# Patient Record
Sex: Male | Born: 1960 | Race: White | Hispanic: No | Marital: Married | Smoking: Former smoker
Health system: Southern US, Community
[De-identification: ages and names within clinical notes are randomized; demographics above are authoritative.]

## PROBLEM LIST (undated history)

## (undated) DIAGNOSIS — M109 Gout, unspecified: Secondary | ICD-10-CM

## (undated) DIAGNOSIS — G2581 Restless legs syndrome: Secondary | ICD-10-CM

## (undated) DIAGNOSIS — Z8709 Personal history of other diseases of the respiratory system: Secondary | ICD-10-CM

## (undated) HISTORY — PX: APPENDECTOMY: SHX54

## (undated) HISTORY — DX: Gout, unspecified: M10.9

## (undated) HISTORY — DX: Restless legs syndrome: G25.81

---

## 2003-07-05 ENCOUNTER — Ambulatory Visit (HOSPITAL_COMMUNITY)
Admission: RE | Admit: 2003-07-05 | Discharge: 2003-07-05 | Payer: Self-pay | Admitting: Physical Medicine and Rehabilitation

## 2003-07-05 ENCOUNTER — Encounter: Payer: Self-pay | Admitting: Physical Medicine and Rehabilitation

## 2006-09-23 ENCOUNTER — Ambulatory Visit (HOSPITAL_COMMUNITY): Admission: RE | Admit: 2006-09-23 | Discharge: 2006-09-23 | Payer: Self-pay | Admitting: Internal Medicine

## 2013-12-05 ENCOUNTER — Inpatient Hospital Stay (HOSPITAL_COMMUNITY)
Admission: EM | Admit: 2013-12-05 | Discharge: 2013-12-08 | DRG: 013 | Disposition: A | Discharge status: Home / Self Care | Payer: Self-pay | Attending: Otolaryngology | Admitting: Otolaryngology

## 2013-12-05 ENCOUNTER — Emergency Department (HOSPITAL_COMMUNITY): Payer: Self-pay

## 2013-12-05 ENCOUNTER — Encounter (HOSPITAL_COMMUNITY)
Admission: EM | Disposition: A | Discharge status: Home / Self Care | Payer: Self-pay | Source: Home / Self Care | Attending: Otolaryngology

## 2013-12-05 ENCOUNTER — Encounter (HOSPITAL_COMMUNITY): Payer: Self-pay | Admitting: Anesthesiology

## 2013-12-05 ENCOUNTER — Encounter (HOSPITAL_COMMUNITY): Payer: Self-pay | Admitting: Emergency Medicine

## 2013-12-05 ENCOUNTER — Emergency Department (HOSPITAL_COMMUNITY): Payer: Self-pay | Admitting: Anesthesiology

## 2013-12-05 DIAGNOSIS — J36 Peritonsillar abscess: Principal | ICD-10-CM | POA: Diagnosis present

## 2013-12-05 DIAGNOSIS — R51 Headache: Secondary | ICD-10-CM | POA: Diagnosis present

## 2013-12-05 DIAGNOSIS — Z87891 Personal history of nicotine dependence: Secondary | ICD-10-CM

## 2013-12-05 HISTORY — PX: DIRECT LARYNGOSCOPY: SHX5326

## 2013-12-05 HISTORY — PX: TONSILLECTOMY: SHX5217

## 2013-12-05 HISTORY — PX: TRACHEOSTOMY TUBE PLACEMENT: SHX814

## 2013-12-05 LAB — CBC WITH DIFFERENTIAL/PLATELET
Basophils Absolute: 0 10*3/uL (ref 0.0–0.1)
Basophils Relative: 0 % (ref 0–1)
Eosinophils Relative: 1 % (ref 0–5)
HCT: 41.6 % (ref 39.0–52.0)
MCHC: 34.4 g/dL (ref 30.0–36.0)
MCV: 94.3 fL (ref 78.0–100.0)
Monocytes Absolute: 1.6 10*3/uL — ABNORMAL HIGH (ref 0.1–1.0)
Neutro Abs: 11.1 10*3/uL — ABNORMAL HIGH (ref 1.7–7.7)
Platelets: 215 10*3/uL (ref 150–400)
RDW: 13.9 % (ref 11.5–15.5)
WBC: 14.6 10*3/uL — ABNORMAL HIGH (ref 4.0–10.5)

## 2013-12-05 SURGERY — LARYNGOSCOPY, DIRECT
Anesthesia: General | Laterality: Right

## 2013-12-05 MED ORDER — IOHEXOL 300 MG/ML  SOLN
100.0000 mL | Freq: Once | INTRAMUSCULAR | Status: AC | PRN
  Administered 2013-12-05: 100 mL via INTRAVENOUS

## 2013-12-05 MED ORDER — SUCCINYLCHOLINE CHLORIDE 20 MG/ML IJ SOLN
INTRAMUSCULAR | Status: DC | PRN
  Administered 2013-12-05: 160 mg via INTRAVENOUS

## 2013-12-05 MED ORDER — SODIUM CHLORIDE 0.9 % IV BOLUS (SEPSIS)
500.0000 mL | Freq: Once | INTRAVENOUS | Status: AC
  Administered 2013-12-05: 500 mL via INTRAVENOUS

## 2013-12-05 MED ORDER — FENTANYL CITRATE 0.05 MG/ML IJ SOLN
INTRAMUSCULAR | Status: DC | PRN
  Administered 2013-12-05: 100 ug via INTRAVENOUS
  Administered 2013-12-05: 50 ug via INTRAVENOUS
  Administered 2013-12-05 (×2): 100 ug via INTRAVENOUS

## 2013-12-05 MED ORDER — MORPHINE SULFATE 4 MG/ML IJ SOLN
4.0000 mg | Freq: Once | INTRAMUSCULAR | Status: AC
  Administered 2013-12-05: 4 mg via INTRAVENOUS
  Filled 2013-12-05: qty 1

## 2013-12-05 MED ORDER — SODIUM CHLORIDE 0.9 % IV BOLUS (SEPSIS)
1000.0000 mL | Freq: Once | INTRAVENOUS | Status: AC
  Administered 2013-12-05: 1000 mL via INTRAVENOUS

## 2013-12-05 MED ORDER — MIDAZOLAM HCL 2 MG/2ML IJ SOLN
INTRAMUSCULAR | Status: AC
  Filled 2013-12-05: qty 2

## 2013-12-05 MED ORDER — PROPOFOL 10 MG/ML IV BOLUS
INTRAVENOUS | Status: AC
  Filled 2013-12-05: qty 20

## 2013-12-05 MED ORDER — SUCCINYLCHOLINE CHLORIDE 20 MG/ML IJ SOLN
INTRAMUSCULAR | Status: AC
Start: 2013-12-05 — End: 2013-12-05
  Filled 2013-12-05: qty 1

## 2013-12-05 MED ORDER — LIDOCAINE HCL 2 % EX GEL
Freq: Once | CUTANEOUS | Status: AC
  Administered 2013-12-05: 1

## 2013-12-05 MED ORDER — FENTANYL CITRATE 0.05 MG/ML IJ SOLN
INTRAMUSCULAR | Status: AC
  Filled 2013-12-05: qty 2

## 2013-12-05 MED ORDER — MIDAZOLAM HCL 5 MG/5ML IJ SOLN
INTRAMUSCULAR | Status: DC | PRN
  Administered 2013-12-05 – 2013-12-06 (×2): 2 mg via INTRAVENOUS

## 2013-12-05 MED ORDER — SUCCINYLCHOLINE CHLORIDE 20 MG/ML IJ SOLN
INTRAMUSCULAR | Status: AC
  Filled 2013-12-05: qty 1

## 2013-12-05 MED ORDER — LIDOCAINE HCL 2 % EX GEL
CUTANEOUS | Status: AC
  Administered 2013-12-05: 1
  Filled 2013-12-05: qty 10

## 2013-12-05 MED ORDER — LACTATED RINGERS IV SOLN
INTRAVENOUS | Status: DC | PRN
  Administered 2013-12-05: 22:00:00 via INTRAVENOUS

## 2013-12-05 MED ORDER — FENTANYL CITRATE 0.05 MG/ML IJ SOLN
INTRAMUSCULAR | Status: AC
  Filled 2013-12-05: qty 5

## 2013-12-05 MED ORDER — PROPOFOL 10 MG/ML IV BOLUS
INTRAVENOUS | Status: DC | PRN
  Administered 2013-12-05: 200 mg via INTRAVENOUS

## 2013-12-05 MED ORDER — LIDOCAINE HCL (PF) 2 % IJ SOLN
INTRAMUSCULAR | Status: DC | PRN
  Administered 2013-12-05: 75 mg via INTRADERMAL

## 2013-12-05 MED ORDER — LIDOCAINE HCL (CARDIAC) 20 MG/ML IV SOLN
INTRAVENOUS | Status: AC
  Filled 2013-12-05: qty 5

## 2013-12-05 MED ORDER — ONDANSETRON HCL 4 MG/2ML IJ SOLN
INTRAMUSCULAR | Status: AC
  Filled 2013-12-05: qty 2

## 2013-12-05 MED ORDER — BUPIVACAINE-EPINEPHRINE 0.5% -1:200000 IJ SOLN
INTRAMUSCULAR | Status: AC
  Filled 2013-12-05: qty 1

## 2013-12-05 MED ORDER — SODIUM CHLORIDE 0.9 % IV SOLN
INTRAVENOUS | Status: DC
  Administered 2013-12-05 – 2013-12-07 (×2): via INTRAVENOUS

## 2013-12-05 MED ORDER — PENICILLIN G POTASSIUM 5000000 UNITS IJ SOLR
2.4000 10*6.[IU] | Freq: Once | INTRAVENOUS | Status: AC
  Administered 2013-12-05: 2.4 10*6.[IU] via INTRAVENOUS
  Filled 2013-12-05: qty 2.4

## 2013-12-05 SURGICAL SUPPLY — 47 items
ATTRACTOMAT 16X20 MAGNETIC DRP (DRAPES) IMPLANT
BAG ZIPLOCK 12X15 (MISCELLANEOUS) ×3 IMPLANT
BALLN PULM 15 16.5 18X75 (BALLOONS) ×3
BALLOON PULM 15 16.5 18X75 (BALLOONS) ×2 IMPLANT
BLADE SURG 15 STRL LF DISP TIS (BLADE) ×2 IMPLANT
BLADE SURG 15 STRL SS (BLADE) ×1
BLADE SURG SZ10 CARB STEEL (BLADE) ×3 IMPLANT
CANISTER SUCTION 2500CC (MISCELLANEOUS) ×3 IMPLANT
CATH ROBINSON RED A/P 14FR (CATHETERS) IMPLANT
COAGULATOR SUCT SWTCH 10FR 6 (ELECTROSURGICAL) ×6 IMPLANT
CONT SPEC 4OZ CLIKSEAL STRL BL (MISCELLANEOUS) ×6 IMPLANT
COVER SURGICAL LIGHT HANDLE (MISCELLANEOUS) ×3 IMPLANT
CRADLE DONUT ADULT HEAD (MISCELLANEOUS) IMPLANT
DRAIN PENROSE 18X1/4 LTX STRL (WOUND CARE) IMPLANT
DRAPE PED LAPAROTOMY (DRAPES) ×3 IMPLANT
DRAPE PROXIMA HALF (DRAPES) ×3 IMPLANT
ELECT REM PT RETURN 9FT ADLT (ELECTROSURGICAL) ×3
ELECTRODE REM PT RTRN 9FT ADLT (ELECTROSURGICAL) ×2 IMPLANT
GAUZE PACKING IODOFORM 1/4X5 (PACKING) IMPLANT
GAUZE SPONGE 4X4 16PLY XRAY LF (GAUZE/BANDAGES/DRESSINGS) ×6 IMPLANT
GLOVE ECLIPSE 8.0 STRL XLNG CF (GLOVE) ×3 IMPLANT
GLOVE SURG SS PI 8.5 STRL IVOR (GLOVE) ×3
GLOVE SURG SS PI 8.5 STRL STRW (GLOVE) ×6 IMPLANT
GOWN SPEC L3 XXLG W/TWL (GOWN DISPOSABLE) ×9 IMPLANT
GOWN STRL REIN XL XLG (GOWN DISPOSABLE) ×3 IMPLANT
GUARD TEETH (MISCELLANEOUS) ×3 IMPLANT
KIT BASIN OR (CUSTOM PROCEDURE TRAY) ×6 IMPLANT
MARKER SKIN DUAL TIP RULER LAB (MISCELLANEOUS) ×3 IMPLANT
NS IRRIG 1000ML POUR BTL (IV SOLUTION) ×3 IMPLANT
PACK BASIC VI WITH GOWN DISP (CUSTOM PROCEDURE TRAY) ×3 IMPLANT
PACK EENT SPLIT (PACKS) ×3 IMPLANT
PAD ARMBOARD 7.5X6 YLW CONV (MISCELLANEOUS) ×6 IMPLANT
PATTIES SURGICAL .5 X3 (DISPOSABLE) IMPLANT
PENCIL BUTTON HOLSTER BLD 10FT (ELECTRODE) ×3 IMPLANT
SPONGE DRAIN TRACH 4X4 STRL 2S (GAUZE/BANDAGES/DRESSINGS) ×3 IMPLANT
SPONGE GAUZE 4X4 12PLY (GAUZE/BANDAGES/DRESSINGS) ×3 IMPLANT
SURGILUBE 2OZ TUBE FLIPTOP (MISCELLANEOUS) ×3 IMPLANT
SYR BULB IRRIGATION 50ML (SYRINGE) ×3 IMPLANT
SYR INFLATE BILIARY GAUGE (MISCELLANEOUS) ×3 IMPLANT
TOWEL OR 17X24 6PK STRL BLUE (TOWEL DISPOSABLE) ×6 IMPLANT
TRAP SPECIMEN MUCOUS 40CC (MISCELLANEOUS) IMPLANT
TUBE CONNECTING 12X1/4 (SUCTIONS) ×3 IMPLANT
TUBE TRACH SHILEY  6 DIST  CUF (TUBING) ×3 IMPLANT
WATER STERILE IRR 1000ML POUR (IV SOLUTION) ×3 IMPLANT
WATER STERILE IRR 1500ML POUR (IV SOLUTION) IMPLANT
YANKAUER SUCT BULB TIP 10FT TU (MISCELLANEOUS) ×3 IMPLANT
YANKAUER SUCT BULB TIP NO VENT (SUCTIONS) ×3 IMPLANT

## 2013-12-05 NOTE — Anesthesia Preprocedure Evaluation (Addendum)
Anesthesia Evaluation  Patient identified by MRN, date of birth, ID band Patient awake    Reviewed: Allergy & Precautions, H&P , NPO status , Patient's Chart, lab work & pertinent test results  Airway Mallampati: IV TM Distance: >3 FB Neck ROM: full  Mouth opening: Limited Mouth Opening  Dental no notable dental hx.    Pulmonary former smoker,    Pulmonary exam normal       Cardiovascular Exercise Tolerance: Good     Neuro/Psych negative neurological ROS  negative psych ROS   GI/Hepatic negative GI ROS, Neg liver ROS,   Endo/Other  negative endocrine ROS  Renal/GU negative Renal ROS  negative genitourinary   Musculoskeletal   Abdominal Normal abdominal exam  (+)   Peds  Hematology negative hematology ROS (+)   Anesthesia Other Findings   Reproductive/Obstetrics negative OB ROS                          Anesthesia Physical Anesthesia Plan  ASA: II and emergent  Anesthesia Plan: General   Post-op Pain Management:    Induction: Intravenous  Airway Management Planned: Oral ETT  Additional Equipment:   Intra-op Plan:   Post-operative Plan: Possible Post-op intubation/ventilation and Extubation in OR  Informed Consent: I have reviewed the patients History and Physical, chart, labs and discussed the procedure including the risks, benefits and alternatives for the proposed anesthesia with the patient or authorized representative who has indicated his/her understanding and acceptance.   Dental Advisory Given  Plan Discussed with: CRNA, Surgeon and Anesthesiologist  Anesthesia Plan Comments: (Glidescope in room.)       Anesthesia Quick Evaluation

## 2013-12-05 NOTE — ED Notes (Signed)
Pt presents with c/o throat swelling. Pt started with a scratchy and sore throat on Tuesday and it progressed to throat swelling on Wednesday. MD called in an antibiotic for him, azithromycin, yesterday and he has had 2 doses so far. Pt's wife reports that the swelling has not gone down any and pt says he has been coughing and feels like something is caught in his throat.

## 2013-12-05 NOTE — ED Provider Notes (Signed)
CSN: [id number]     Arrival date & time 12/05/13  1830 History   First MD Initiated Contact with Patient 12/05/13 1859     Chief Complaint  Patient presents with  . Oral Swelling   (Consider location/radiation/quality/duration/timing/severity/associated sxs/prior Treatment) The history is provided by the patient and the spouse.   patient here complaining of right-sided throat swelling x5 days. Saw his physician and was prescribed azithromycin in symptoms have not improved. Notes increased trouble swallowing but can handle her secretions. No fever or chills. Saw his Dr. today and sent here for evaluation of possible peritonsillar abscess.  History reviewed. No pertinent past medical history. Past Surgical History  Procedure Laterality Date  . Appendectomy     No family history on file. History  Substance Use Topics  . Smoking status: Former Games developer  . Smokeless tobacco: Not on file  . Alcohol Use: Yes     Comment: every other day     Review of Systems  All other systems reviewed and are negative.    Allergies  Review of patient's allergies indicates no known allergies.  Home Medications  No current outpatient prescriptions on file. BP 149/86  Pulse 91  Temp(Src) 98.7 F (37.1 C) (Oral)  Resp 18  SpO2 96% Physical Exam  Nursing note and vitals reviewed. Constitutional: He is oriented to person, place, and time. He appears well-developed and well-nourished.  Non-toxic appearance. No distress.  HENT:  Head: Normocephalic and atraumatic.  Mouth/Throat:    Eyes: Conjunctivae, EOM and lids are normal. Pupils are equal, round, and reactive to light.  Neck: Normal range of motion. Neck supple. No tracheal deviation present. No mass present.  Cardiovascular: Normal rate, regular rhythm and normal heart sounds.  Exam reveals no gallop.   No murmur heard. Pulmonary/Chest: Effort normal and breath sounds normal. No stridor. No respiratory distress. He has no decreased breath  sounds. He has no wheezes. He has no rhonchi. He has no rales.  Abdominal: Soft. Normal appearance and bowel sounds are normal. He exhibits no distension. There is no tenderness. There is no rebound and no CVA tenderness.  Musculoskeletal: Normal range of motion. He exhibits no edema and no tenderness.  Neurological: He is alert and oriented to person, place, and time. He has normal strength. No cranial nerve deficit or sensory deficit. GCS eye subscore is 4. GCS verbal subscore is 5. GCS motor subscore is 6.  Skin: Skin is warm and dry. No abrasion and no rash noted.  Psychiatric: He has a normal mood and affect. His speech is normal and behavior is normal.    ED Course  Procedures (including critical care time) Labs Review Labs Reviewed  CBC WITH DIFFERENTIAL   Imaging Review No results found.  EKG Interpretation   None       MDM  No diagnosis found.   Patient given morphine for his pain. CT of neck shows peritonsillar abscess. Spoke with ENT on call and he will come see the patient  Toy Baker, MD 12/05/13 2114

## 2013-12-05 NOTE — H&P (Signed)
Kort, Stettler 52 y.o., male [id number]     Chief Complaint: severe sore throat  HPI: 52 yo wm, onset sore throat 4 days ago.  Progressively worse, with localization to RIGHT side.  Hoarse, but no breathing difficulty.  Neck pain RIGHT, referred RIGHT otalgia.  Pain and difficulty with swallowing.  Had Zithromax started yesterday.  No documented fevers.   No DM, Prednisone, HIV or other immune issues.  Ex smoker.  No hx cancer.  No prior history tonsillitis, strep throat or similar throat neck infection.  No foreign body ingestion or trauma to throat.  No apparent problem teeth.  CT neck shows very inferior tonsil multiloculated abscess on RIGHT effacing vallecula and apparently into RIGHT base of tongue.  No obvious parapharyngeal involvement.    WUJ:WJXBJYN reviewed. No pertinent past medical history.  Surg Hx: Past Surgical History  Procedure Laterality Date  . Appendectomy      FHx:  No family history on file. SocHx:  reports that he has quit smoking. He does not have any smokeless tobacco history on file. He reports that he drinks alcohol. He reports that he does not use illicit drugs.  ALLERGIES: No Known Allergies   (Not in a hospital admission)  Results for orders placed during the hospital encounter of 12/05/13 (from the past 48 hour(s))  CBC WITH DIFFERENTIAL     Status: Abnormal   Collection Time    12/05/13  7:26 PM      Result Value Range   WBC 14.6 (*) 4.0 - 10.5 K/uL   RBC 4.41  4.22 - 5.81 MIL/uL   Hemoglobin 14.3  13.0 - 17.0 g/dL   HCT 82.9  56.2 - 13.0 %   MCV 94.3  78.0 - 100.0 fL   MCH 32.4  26.0 - 34.0 pg   MCHC 34.4  30.0 - 36.0 g/dL   RDW 86.5  78.4 - 69.6 %   Platelets 215  150 - 400 K/uL   Neutrophils Relative % 76  43 - 77 %   Neutro Abs 11.1 (*) 1.7 - 7.7 K/uL   Lymphocytes Relative 12  12 - 46 %   Lymphs Abs 1.7  0.7 - 4.0 K/uL   Monocytes Relative 11  3 - 12 %   Monocytes Absolute 1.6 (*) 0.1 - 1.0 K/uL   Eosinophils Relative 1  0 - 5 %    Eosinophils Absolute 0.1  0.0 - 0.7 K/uL   Basophils Relative 0  0 - 1 %   Basophils Absolute 0.0  0.0 - 0.1 K/uL   Ct Soft Tissue Neck W Contrast  12/05/2013   CLINICAL DATA:  Sore throat, right-sided pain, dysphagia.  EXAM: CT NECK WITH CONTRAST  TECHNIQUE: Multidetector CT imaging of the neck was performed using the standard protocol following the bolus administration of intravenous contrast.  CONTRAST:  OMNIPAQUE IOHEXOL 300 MG/ML  SOLN  COMPARISON:  None.  FINDINGS: Poorly marginated, peripherally enhancing multilocular right peritonsillar abscess extending inferiorly, effacing the right vallecula and extending into the right sublingual space. The largest component measures approximately 2.2 cm maximum transverse diameter. There is normal vascular enhancement. No significant airway compromise. No significant cervical adenopathy. No retropharyngeal fluid collections or gas. Visualized lung apices clear.  IMPRESSION: 1. Large right multiloculated peritonsillar abscess dissecting inferiorly into the sublingual space.   Electronically Signed   By: Oley Balm M.D.   On: 12/05/2013 20:55    ROS:  No chest pain or SOB.   Blood pressure  142/81, pulse 87, temperature 98.5 F (36.9 C), temperature source Oral, resp. rate 16, SpO2 95.00%.  PHYSICAL EXAM: Overall appearance:  Warm to touch, distressed.  No fetor oris.  Minimal "hot potato" voice.  Mental status intact. Head:  NCAT Ears:  Clear, nl TM's Nose:  Sl dry Oral Cavity:  Moist.  Teeth in fair to good repair Oral Pharynx/Hypopharynx/Larynx:  RIGHT tonsil erythematous without bulging or exudate.  Nl soft palate Neuro:  Grossly intact. Neck:  Tender but not particularly swollen RIGHT JDG region.  Min induration. Lungs:  Clear to auscultation Heart:  RRR, no murmurs Abd:  Soft, active Ext: nl config Neuro: symmetric, grossly intact.   Flexible laryngoscopy:  Using 2% viscous xylocaine topical anesthesia, flexible  laryngoscopy was performed through the LEFT nose.  Swelling of vallecula on RIGHT, swelling of epiglottis with retrodisplacement.  Glottis appears normal.    Studies Reviewed:  CT neck as above    Assessment/Plan Probable RIGHT peritonsillar abscess, although very inferior location, and involvement of base of tongue area is unusual.    Will examine under anesthesia including direct laryngoscopy.  RIGHT tonsillectomy to evacuate inferior RIGHT peritonsillar abscess.  Possible external approach.  Will consider leaving intubated overnight.  Possible tracheostomy.  Discussed with patient and wife.  Questions were answered.  Informed consent obtained.  Received 2.4 million units IV Pen in ED.    Flo Shanks 12/05/2013, 10:01 PM

## 2013-12-05 NOTE — ED Notes (Signed)
He tells me he has had a right-sided sore throat since this Tues., which persists, in spite of him taking Azithromycin.  He is in no distress; and states it is difficult to swallow, but he can manage it.

## 2013-12-06 DIAGNOSIS — J36 Peritonsillar abscess: Principal | ICD-10-CM | POA: Diagnosis present

## 2013-12-06 LAB — CREATININE, SERUM: Creatinine, Ser: 1.09 mg/dL (ref 0.50–1.35)

## 2013-12-06 MED ORDER — HEPARIN SODIUM (PORCINE) 5000 UNIT/ML IJ SOLN
5000.0000 [IU] | Freq: Three times a day (TID) | INTRAMUSCULAR | Status: DC
  Administered 2013-12-06 – 2013-12-08 (×7): 5000 [IU] via SUBCUTANEOUS
  Filled 2013-12-06 (×10): qty 1

## 2013-12-06 MED ORDER — MORPHINE SULFATE 10 MG/ML IJ SOLN
1.0000 mg | INTRAMUSCULAR | Status: DC | PRN
  Administered 2013-12-06: 1 mg via INTRAVENOUS
  Filled 2013-12-06: qty 1

## 2013-12-06 MED ORDER — MORPHINE SULFATE 2 MG/ML IJ SOLN
2.0000 mg | INTRAMUSCULAR | Status: DC | PRN
  Administered 2013-12-06: 2 mg via INTRAVENOUS
  Administered 2013-12-06: 4 mg via INTRAVENOUS
  Administered 2013-12-06: 2 mg via INTRAVENOUS
  Administered 2013-12-06: 4 mg via INTRAVENOUS
  Administered 2013-12-06 – 2013-12-07 (×4): 2 mg via INTRAVENOUS
  Filled 2013-12-06 (×2): qty 1
  Filled 2013-12-06 (×2): qty 2
  Filled 2013-12-06: qty 1
  Filled 2013-12-06: qty 2
  Filled 2013-12-06 (×2): qty 1

## 2013-12-06 MED ORDER — CLONAZEPAM 1 MG PO TABS
1.0000 mg | ORAL_TABLET | Freq: Every day | ORAL | Status: DC
  Administered 2013-12-07: 2 mg via ORAL
  Filled 2013-12-06: qty 2

## 2013-12-06 MED ORDER — PNEUMOCOCCAL VAC POLYVALENT 25 MCG/0.5ML IJ INJ
0.5000 mL | INJECTION | INTRAMUSCULAR | Status: AC
  Administered 2013-12-07: 0.5 mL via INTRAMUSCULAR
  Filled 2013-12-06 (×2): qty 0.5

## 2013-12-06 MED ORDER — SODIUM CHLORIDE 0.9 % IV SOLN
3.0000 g | Freq: Four times a day (QID) | INTRAVENOUS | Status: DC
  Administered 2013-12-06 – 2013-12-08 (×10): 3 g via INTRAVENOUS
  Filled 2013-12-06 (×17): qty 3

## 2013-12-06 MED ORDER — SODIUM CHLORIDE 0.9 % IV SOLN: 1.0000 g | Freq: Three times a day (TID) | INTRAVENOUS | Status: DC

## 2013-12-06 MED ORDER — LIDOCAINE VISCOUS 2 % MT SOLN
15.0000 mL | Freq: Once | OROMUCOSAL | Status: AC
  Administered 2013-12-07: 15 mL via OROMUCOSAL
  Filled 2013-12-06: qty 15

## 2013-12-06 MED ORDER — PROMETHAZINE HCL 25 MG/ML IJ SOLN: 6.2500 mg | INTRAMUSCULAR | Status: DC | PRN

## 2013-12-06 MED ORDER — KETOROLAC TROMETHAMINE 30 MG/ML IJ SOLN: 15.0000 mg | Freq: Once | INTRAMUSCULAR | Status: DC | PRN

## 2013-12-06 MED ORDER — HYDROMORPHONE HCL PF 1 MG/ML IJ SOLN
INTRAMUSCULAR | Status: AC
  Administered 2013-12-06: 0.5 mg
  Filled 2013-12-06: qty 1

## 2013-12-06 MED ORDER — ONDANSETRON HCL 4 MG/2ML IJ SOLN
4.0000 mg | Freq: Four times a day (QID) | INTRAMUSCULAR | Status: DC | PRN
  Administered 2013-12-06: 4 mg via INTRAVENOUS
  Filled 2013-12-06: qty 2

## 2013-12-06 MED ORDER — OXYCODONE-ACETAMINOPHEN 5-325 MG/5ML PO SOLN
5.0000 mL | ORAL | Status: DC | PRN
  Administered 2013-12-07 (×2): 5 mL via ORAL
  Filled 2013-12-06 (×2): qty 5

## 2013-12-06 MED ORDER — MELATONIN 10 MG PO CAPS: 1.0000 | ORAL_CAPSULE | Freq: Every day | ORAL | Status: DC

## 2013-12-06 MED ORDER — ONDANSETRON HCL 4 MG/2ML IJ SOLN
INTRAMUSCULAR | Status: DC | PRN
  Administered 2013-12-06: 4 mg via INTRAVENOUS

## 2013-12-06 MED ORDER — BUPIVACAINE-EPINEPHRINE PF 0.5-1:200000 % IJ SOLN
INTRAMUSCULAR | Status: DC | PRN
  Administered 2013-12-06: 50 mL

## 2013-12-06 MED ORDER — MORPHINE SULFATE 2 MG/ML IJ SOLN: 1.0000 mg | INTRAMUSCULAR | Status: DC | PRN

## 2013-12-06 MED ORDER — CITALOPRAM HYDROBROMIDE 40 MG PO TABS
40.0000 mg | ORAL_TABLET | Freq: Every day | ORAL | Status: DC
  Administered 2013-12-07: 40 mg via ORAL
  Filled 2013-12-06 (×4): qty 1

## 2013-12-06 MED ORDER — LORAZEPAM 2 MG/ML IJ SOLN
1.0000 mg | INTRAMUSCULAR | Status: DC | PRN
  Administered 2013-12-06 – 2013-12-07 (×5): 1 mg via INTRAVENOUS
  Filled 2013-12-06 (×5): qty 1

## 2013-12-06 MED ORDER — DEXTROSE-NACL 5-0.45 % IV SOLN
INTRAVENOUS | Status: DC
  Administered 2013-12-06 (×2): via INTRAVENOUS
  Administered 2013-12-06: 125 mL/h via INTRAVENOUS
  Administered 2013-12-07: 02:00:00 via INTRAVENOUS

## 2013-12-06 MED ORDER — 0.9 % SODIUM CHLORIDE (POUR BTL) OPTIME
TOPICAL | Status: DC | PRN
  Administered 2013-12-06: 1000 mL

## 2013-12-06 MED ORDER — MIDAZOLAM HCL 2 MG/2ML IJ SOLN
INTRAMUSCULAR | Status: AC
  Filled 2013-12-06: qty 2

## 2013-12-06 MED ORDER — PROMETHAZINE HCL 25 MG PO TABS: 25.0000 mg | ORAL_TABLET | Freq: Four times a day (QID) | ORAL | Status: DC | PRN

## 2013-12-06 MED ORDER — ALLOPURINOL 300 MG PO TABS
300.0000 mg | ORAL_TABLET | Freq: Every day | ORAL | Status: DC
  Administered 2013-12-08: 300 mg via ORAL
  Filled 2013-12-06 (×3): qty 1

## 2013-12-06 MED ORDER — MEPERIDINE HCL 50 MG/ML IJ SOLN: 6.2500 mg | INTRAMUSCULAR | Status: DC | PRN

## 2013-12-06 MED ORDER — PROMETHAZINE HCL 25 MG RE SUPP: 25.0000 mg | Freq: Four times a day (QID) | RECTAL | Status: DC | PRN

## 2013-12-06 MED ORDER — HYDROMORPHONE HCL PF 1 MG/ML IJ SOLN: 0.2500 mg | INTRAMUSCULAR | Status: DC | PRN

## 2013-12-06 NOTE — Significant Event (Signed)
Pt takes klonopin at home for anxiety and sleep.  Will order prn IV ativan until he is able to take po medications.  He also reports that 2 mg morphine every hour is insufficient for pain control at present.  Will change morphine to 2 -4 mg q1h prn IV.  Coralyn Helling, MD 12/06/2013, 1:52 AM

## 2013-12-06 NOTE — Progress Notes (Signed)
Second Attempted to contact MD oncall for ENT for patient's nausea. No response. Left message.

## 2013-12-06 NOTE — Op Note (Signed)
12/06/2013  12:23 AM    Morrison Old  [id number]   Pre-Op Dx:  Right peritonsillar abscess with extension into the base of tongue  Post-op Dx: Same  Proc: Direct laryngoscopy. Right tonsillectomy with drainage right peritonsillar abscess. Tracheostomy   Surg:  Flo Shanks T MD  Anes:  GOT  EBL:  50 mL  Comp:  None  Findings:  A bulky tongue.  Swelling at the base of tongue and supraglottis with some difficulty intubating the patient.  1-2+ inflamed right tonsil. No distinct abscess cavity identified peritonsillar or with hemostatic probing in the parapharyngeal area. With finger dissection, apparent soft cavities in the right vallecula/base of tongue consistent with abscess, but no frank pus visualized.  Lower midline neck anatomy.  Procedure: With the patient in a comfortable supine position, general orotracheal anesthesia was achieved after several attempts using a stylette, and finally the glide scope. Anesthesia was continued per orotracheal tube.  The table was turned 90 the patient placed in Trendelenburg. A clean preparation and draping was accomplished. A rubber tooth guard was applied. The anterior commissure laryngoscope was used to inspect the oropharynx hypopharynx and larynx with the findings as described above. No frank abscess was identified. The laryngoscope was removed. The tooth guard was removed. Dental status was intact.   With some difficulty owing to the bulky tongue and relatively small mouth, the Crowe-Davis mouth gag was introduced. A #3 grooved blade was not deep enough.  A #4 grooved blade was used.  Under direct vision, the right tonsil was dissected from its muscular fossa using electrocautery. The tonsil was removed in its entirety. No obvious pus was identified. Palpation in  the tonsil fossa did not reveal fluctuance or induration.  A Kelly hemostat was used to probe through the constrictor musculature and inferiorly into the base of tongue  region. No frank pus was identified.  The Crowe-Davis mouth gag was relaxed and removed.  On direct palpation, some soft cavities were encountered in the right vallecula and base of tongue region, and opened with a finger tip, consistent with abscess cavities. This was done blind and no frank pus was seen.  Hemostasis was observed. The mouthgag was relaxed and removed.  A rubber tooth guard was replaced and the direct laryngoscope was reintroduced and the pharynx inspected. There was significant raw surfaces in the pharynx and distortion of the anatomy. I elected to perform tracheostomy for airway security.  Using a completely new set up,  the neck was extended and 1% Xylocaine with 1 100,000 epinephrine, 10 cc total was infiltrated into the surgical site for tracheostomy. A sterile preparation and draping of the lower neck was accomplished. Note that the table had been turned back up towards anesthesia and the patient was at this point placed in reverse Trendelenburg.  The lower neck was palpated with the findings as described above.  A 3 cm transverse incision was sharply executed and carried down through skin and subcutaneous fat. Continue using cautery through the superficial layer of deep cervical fascia until the midline raphae of the strap muscles was identified. These were separated in 2 layers. There was pretracheal fat. The dissection was immediately beneath the thyroid isthmus and no significant vessels were identified. The anterior face of the trachea was identified and cleaned.  A transverse incision probably between rings 3 and 4 was used into the trachea. The mucosa was cauterized for hemostasis. A trach dilator was placed in the opening. The endotracheal tube was backed out. A previously  tested #6 cuffed Shiley tracheostomy tube was placed without difficulty. The cuff was inflated and the inner cannula was placed. Ventilation was assumed per tracheostomy tube. The drain sponge was placed.  Hemostasis was observed. The trach tube was secured in the standard fashion using cotton twill ties.  This point the patient was returned to anesthesia, awakened, and will be transferred from the operating room to the intensive care unit.  Dispo:   OR to ICU  Plan:   IV antibiosis. Routine tracheostomy care. We'll repeat CT scan in a CBC in 2 days. If the swelling is coming down nicely, we'll move rapidly towards downsizing and decannulation.  Cephus Richer MD

## 2013-12-06 NOTE — Anesthesia Postprocedure Evaluation (Signed)
Anesthesia Post Note  Patient: Timothy Camacho  Procedure(s) Performed: Procedure(s) (LRB): INCISION AND DRAINAGE Peri tonsilar  ABSCESS (Right) DIRECT LARYNGOSCOPY (N/A) TONSILECTOMY/ADENOIDECTOMY WITH MYRINGOTOMY (Right)  Anesthesia type: General  Patient location: ICU  Post pain: Pain level controlled  Post assessment: Post-op Vital signs reviewed  Last Vitals:  Filed Vitals:   12/05/13 2016  BP: 142/81  Pulse: 87  Temp: 36.9 C  Resp: 16    Post vital signs: Reviewed  Level of consciousness: sedated  Complications: No apparent anesthesia complications pt had a 6.0 Shiley tracheostomy placed electively because of concerns of airway swelling. The patient has O2 sats of 99% on 50% FIO2 and PSV of 15cmH2O with a RR of 10-15 in room 1225.

## 2013-12-06 NOTE — Progress Notes (Signed)
12/06/2013 1:26 PM  Timothy Camacho [id number]  Post-Op Day 1    Temp:  [98.5 F (36.9 C)-101.1 F (38.4 C)] 99.5 F (37.5 C) (12/21 1200) Pulse Rate:  [81-101] 95 (12/21 1138) Resp:  [11-22] 20 (12/21 1138) BP: (128-173)/(65-96) 131/78 mmHg (12/21 1000) SpO2:  [95 %-100 %] 98 % (12/21 1138) FiO2 (%):  [40 %-50 %] 40 % (12/21 1138) Weight:  [92.987 kg (205 lb)-94 kg (207 lb 3.7 oz)] 94 kg (207 lb 3.7 oz) (12/21 0100),     Intake/Output Summary (Last 24 hours) at 12/06/13 1326 Last data filed at 12/06/13 1100  Gross per 24 hour  Intake 2989.58 ml  Output    675 ml  Net 2314.58 ml    Results for orders placed during the hospital encounter of 12/05/13 (from the past 24 hour(s))  CBC WITH DIFFERENTIAL     Status: Abnormal   Collection Time    12/05/13  7:26 PM      Result Value Range   WBC 14.6 (*) 4.0 - 10.5 K/uL   RBC 4.41  4.22 - 5.81 MIL/uL   Hemoglobin 14.3  13.0 - 17.0 g/dL   HCT 40.9  81.1 - 91.4 %   MCV 94.3  78.0 - 100.0 fL   MCH 32.4  26.0 - 34.0 pg   MCHC 34.4  30.0 - 36.0 g/dL   RDW 78.2  95.6 - 21.3 %   Platelets 215  150 - 400 K/uL   Neutrophils Relative % 76  43 - 77 %   Neutro Abs 11.1 (*) 1.7 - 7.7 K/uL   Lymphocytes Relative 12  12 - 46 %   Lymphs Abs 1.7  0.7 - 4.0 K/uL   Monocytes Relative 11  3 - 12 %   Monocytes Absolute 1.6 (*) 0.1 - 1.0 K/uL   Eosinophils Relative 1  0 - 5 %   Eosinophils Absolute 0.1  0.0 - 0.7 K/uL   Basophils Relative 0  0 - 1 %   Basophils Absolute 0.0  0.0 - 0.1 K/uL  MRSA PCR SCREENING     Status: None   Collection Time    12/06/13 12:58 AM      Result Value Range   MRSA by PCR NEGATIVE  NEGATIVE  CREATININE, SERUM     Status: Abnormal   Collection Time    12/06/13  1:19 AM      Result Value Range   Creatinine, Ser 1.09  0.50 - 1.35 mg/dL   GFR calc non Af Amer 77 (*) >90 mL/min   GFR calc Af Amer 89 (*) >90 mL/min    SUBJECTIVE:  Mod-lg pain, esp headache.  Overall color, energy good.    OBJECTIVE:   Sitting up.  Mod bloody mucoid drainage from trach.  Trach cuff down!  Able to phonate.  Mod/lg tongue swelling.    IMPRESSION:  Satisfactory check  PLAN:  Increase activity.  Trach cuff back up until 24 hrs to prevent aspiration of secretions and subQ emphysema.  Blood cultures pending.  Will check CBC in AM.  Consider flexible laryngoscopy.  Probably repeat CT neck Tuesday.  Will downsize and decannulate quickly as the throat heals.  Flo Shanks

## 2013-12-06 NOTE — Progress Notes (Addendum)
[facility]link notified due to patient's nausea and unsuccessful attempts to get in contact with on call MD for ENT. Order for Zofran IV given

## 2013-12-06 NOTE — Progress Notes (Signed)
PHARMACIST - PHYSICIAN ORDER COMMUNICATION  CONCERNING: P&T Medication Policy on Herbal Medications  DESCRIPTION:  This patient's order for:  Melatonin  has been noted.  This product(s) is classified as an "herbal" or natural product. Due to a lack of definitive safety studies or FDA approval, nonstandard manufacturing practices, plus the potential risk of unknown drug-drug interactions while on inpatient medications, the Pharmacy and Therapeutics Committee does not permit the use of "herbal" or natural products of this type within [facility].   ACTION TAKEN: The pharmacy department is unable to verify this order at this time and your patient has been informed of this safety policy. Please reevaluate patient's clinical condition at discharge and address if the herbal or natural product(s) should be resumed at that time.  Thank you, Terrilee Files, PharmD

## 2013-12-06 NOTE — Significant Event (Signed)
Pt d/w Dr. Lazarus Salines.  Pt required surgical intervention and tracheostomy for Rt peritonsillar abscess.  No other significant medical problems.  He is being recovered from anesthesia in ICU.  Once he has recovered from anesthesia will likely be able to transition off ventilator to trach collar.  I have placed vent weaning order.  Defer formal CCM consultation for now.  Coralyn Helling, MD 12/06/2013, 1:14 AM

## 2013-12-06 NOTE — Progress Notes (Signed)
Attempted to contact oncall MD due to Pt complaint of nausea. No answer. Left message.

## 2013-12-06 NOTE — Progress Notes (Signed)
Attempted to contact on call MD for ENT. Third attempt no answer. And still no call back.

## 2013-12-06 NOTE — Transfer of Care (Signed)
Immediate Anesthesia Transfer of Care Note  Patient: Timothy Camacho  Procedure(s) Performed: Procedure(s): INCISION AND DRAINAGE Peri tonsilar  ABSCESS (Right) DIRECT LARYNGOSCOPY (N/A) TONSILECTOMY/ADENOIDECTOMY WITH MYRINGOTOMY (Right)  Patient Location: ICU  Anesthesia Type:General  Level of Consciousness: awake  Airway & Oxygen Therapy: Patient Spontanous Breathing, Patient placed on Ventilator (see vital sign flow sheet for setting) and Ventilator on pressure support.  Post-op Assessment: Post -op Vital signs reviewed and stable and report given to ICU staff.  Post vital signs: Reviewed and stable  Complications: No apparent anesthesia complications

## 2013-12-07 ENCOUNTER — Encounter (HOSPITAL_COMMUNITY): Payer: Self-pay | Admitting: Otolaryngology

## 2013-12-07 MED ORDER — LIDOCAINE VISCOUS 2 % MT SOLN
15.0000 mL | Freq: Once | OROMUCOSAL | Status: DC
  Filled 2013-12-07: qty 15

## 2013-12-07 NOTE — Progress Notes (Signed)
Subjective: Feels much better.  Objective: Vital signs in last 24 hours: Temp:  [98.1 F (36.7 C)-100.2 F (37.9 C)] 98.6 F (37 C) (12/22 1200) Pulse Rate:  [54-93] 63 (12/22 0900) Resp:  [12-26] 14 (12/22 0900) BP: (92-148)/(52-90) 121/70 mmHg (12/22 0900) SpO2:  [96 %-100 %] 100 % (12/22 0900) FiO2 (%):  [40 %] 40 % (12/22 0452) Weight:  [211 lb 6.7 oz (95.9 kg)] 211 lb 6.7 oz (95.9 kg) (12/22 0400) Weight change: 6 lb 6.7 oz (2.913 kg)    Intake/Output from previous day: 12/21 0701 - 12/22 0700 In: 3300 [I.V.:3000; IV Piggyback:300] Out: 2975 [Urine:2975] Intake/Output this shift: Total I/O In: 475 [I.V.:375; IV Piggyback:100] Out: 800 [Urine:800]  PHYSICAL EXAM: No neck swelling. Trach stable.  Lab Results:  Recent Labs  12/05/13 1926 12/07/13 0348  WBC 14.6* 12.2*  HGB 14.3 11.5*  HCT 41.6 34.7*  PLT 215 198   BMET  Recent Labs  12/06/13 0119  CREATININE 1.09    Studies/Results: Ct Soft Tissue Neck W Contrast  12/05/2013   CLINICAL DATA:  Sore throat, right-sided pain, dysphagia.  EXAM: CT NECK WITH CONTRAST  TECHNIQUE: Multidetector CT imaging of the neck was performed using the standard protocol following the bolus administration of intravenous contrast.  CONTRAST:  OMNIPAQUE IOHEXOL 300 MG/ML  SOLN  COMPARISON:  None.  FINDINGS: Poorly marginated, peripherally enhancing multilocular right peritonsillar abscess extending inferiorly, effacing the right vallecula and extending into the right sublingual space. The largest component measures approximately 2.2 cm maximum transverse diameter. There is normal vascular enhancement. No significant airway compromise. No significant cervical adenopathy. No retropharyngeal fluid collections or gas. Visualized lung apices clear.  IMPRESSION: 1. Large right multiloculated peritonsillar abscess dissecting inferiorly into the sublingual space.   Electronically Signed   By: Oley Balm M.D.   On: 12/05/2013 20:55     Medications: I have reviewed the patient's current medications.  Assessment/Plan: Progressing nicely. Fiberoptic exam performed. Reveals open airway, persistent but much improved pharyngeal and supraglottic swelling with white exudate along the right lateral pharyngeal wall (I&D site). Cords visible and patent.  Trach downsized to 4 cuffless. Possible decannulate tomorrow or the next day. Continue Abx. Transfer to floor. Start soft diet.    LOS: 2 days   Love Chowning 12/07/2013, 12:31 PM

## 2013-12-07 NOTE — Progress Notes (Signed)
CARE MANAGEMENT NOTE 12/07/2013  Patient:  Timothy Camacho, Timothy Camacho   Account Number:  [id number]  Date Initiated:  12/07/2013  Documentation initiated by:  DAVIS,RHONDA  Subjective/Objective Assessment:   pt with tonsilar abcess to or for I&d, required tracheostomy during surg.     Action/Plan:   home when stable   Anticipated DC Date:  12/10/2013   Anticipated DC Plan:  HOME/SELF CARE  In-house referral  Financial Counselor      DC Planning Services  NA      [facility] Choice  NA   Choice offered to / List presented to:  NA   DME arranged  NA      DME agency  NA     HH arranged  NA      HH agency  NA   Status of service:  In process, will continue to follow Medicare Important Message given?  NA - LOS <3 / Initial given by admissions (If response is "NO", the following Medicare IM given date fields will be blank) Date Medicare IM given:   Date Additional Medicare IM given:    Discharge Disposition:    Per UR Regulation:  Reviewed for med. necessity/level of care/duration of stay  If discussed at Long Length of Stay Meetings, dates discussed:    Comments:  [id number]/Rhonda Stark Jock, BSN, [place] [phone] Chart Reviewed for discharge and hospital needs. Discharge needs at time of review:  None present will follow for needs. Review of patient progress due on [id number].

## 2013-12-08 LAB — CBC WITH DIFFERENTIAL/PLATELET
Basophils Absolute: 0 10*3/uL (ref 0.0–0.1)
Basophils Relative: 0 % (ref 0–1)
Eosinophils Absolute: 0.2 10*3/uL (ref 0.0–0.7)
Eosinophils Relative: 1 % (ref 0–5)
HCT: 34.7 % — ABNORMAL LOW (ref 39.0–52.0)
Hemoglobin: 11.5 g/dL — ABNORMAL LOW (ref 13.0–17.0)
Lymphs Abs: 2.2 10*3/uL (ref 0.7–4.0)
MCH: 31.9 pg (ref 26.0–34.0)
MCHC: 33.1 g/dL (ref 30.0–36.0)
MCV: 96.1 fL (ref 78.0–100.0)
Monocytes Absolute: 1.4 10*3/uL — ABNORMAL HIGH (ref 0.1–1.0)
Neutro Abs: 8.4 10*3/uL — ABNORMAL HIGH (ref 1.7–7.7)
Neutrophils Relative %: 69 % (ref 43–77)
RBC: 3.61 MIL/uL — ABNORMAL LOW (ref 4.22–5.81)
RDW: 14 % (ref 11.5–15.5)

## 2013-12-08 MED ORDER — AMOXICILLIN-POT CLAVULANATE 875-125 MG PO TABS: 1.0000 | ORAL_TABLET | Freq: Two times a day (BID) | ORAL | Status: DC

## 2013-12-08 MED ORDER — HYDROCODONE-ACETAMINOPHEN 7.5-325 MG PO TABS: 1.0000 | ORAL_TABLET | Freq: Four times a day (QID) | ORAL | Status: DC | PRN

## 2013-12-08 MED ORDER — PROMETHAZINE HCL 25 MG RE SUPP: 25.0000 mg | Freq: Four times a day (QID) | RECTAL | Status: DC | PRN

## 2013-12-08 MED ORDER — AMOXICILLIN-POT CLAVULANATE 875-125 MG PO TABS
1.0000 | ORAL_TABLET | Freq: Two times a day (BID) | ORAL | Status: DC
  Administered 2013-12-08: 1 via ORAL
  Filled 2013-12-08 (×2): qty 1

## 2013-12-08 MED ORDER — HYDROCODONE-ACETAMINOPHEN 7.5-325 MG PO TABS
1.0000 | ORAL_TABLET | Freq: Four times a day (QID) | ORAL | Status: DC | PRN
  Administered 2013-12-08: 1 via ORAL
  Filled 2013-12-08: qty 1

## 2013-12-08 NOTE — Progress Notes (Signed)
Patient discharge home with daughter, alert and oriented, discharge instructions given, patient verbalize understanding of discharge instructions given, My Chart access declined at this time, patient in stable condition at this time

## 2013-12-08 NOTE — Progress Notes (Signed)
Subjective: Doing much better, eating and drinking well.  Objective: Vital signs in last 24 hours: Temp:  [97.8 F (36.6 C)-98.6 F (37 C)] 97.8 F (36.6 C) (12/23 0600) Pulse Rate:  [57-77] 67 (12/23 0600) Resp:  [14-23] 18 (12/23 0600) BP: (106-142)/(58-81) 118/68 mmHg (12/23 0600) SpO2:  [92 %-100 %] 95 % (12/23 0600) FiO2 (%):  [35 %] 35 % (12/23 0337) Weight change:     Intake/Output from previous day: 12/22 0701 - 12/23 0700 In: 850 [I.V.:750; IV Piggyback:100] Out: 3900 [Urine:3900] Intake/Output this shift:    PHYSICAL EXAM: Neck without swelling. Oral cavity and pharynx clear as well. Voice normal. Trac removed, site dressed with steri-strips.  Lab Results:  Recent Labs  12/05/13 1926 12/07/13 0348  WBC 14.6* 12.2*  HGB 14.3 11.5*  HCT 41.6 34.7*  PLT 215 198   BMET  Recent Labs  12/06/13 0119  CREATININE 1.09    Studies/Results: No results found.  Medications: I have reviewed the patient's current medications.  Assessment/Plan: Much improved. Change to PO Abx and discharge home later today.   LOS: 3 days   Timothy Camacho 12/08/2013, 8:10 AM

## 2013-12-08 NOTE — Discharge Summary (Signed)
Physician Discharge Summary  Patient ID: Timothy Camacho MRN: [id number] DOB/AGE: Feb 09, 1961 52 y.o.  Admit date: 12/05/2013 Discharge date: 12/08/2013  Admission Diagnoses:Pharyngeal abscess  Discharge Diagnoses:  Active Problems:   Peritonsillar abscess   Discharged Condition: good  Hospital Course: No complications  Consults: none  Significant Diagnostic Studies: none  Treatments: surgery: Trach, I&D  Discharge Exam: Blood pressure 118/68, pulse 67, temperature 97.8 F (36.6 C), temperature source Oral, resp. rate 18, height 5\' 10"  (1.778 m), weight 211 lb 6.7 oz (95.9 kg), SpO2 95.00%. PHYSICAL EXAM: Swelling gone, trach removed.  Disposition: Final discharge disposition not confirmed     Medication List    ASK your doctor about these medications       acetaminophen 500 MG tablet  Commonly known as:  TYLENOL  Take 500 mg by mouth every 6 (six) hours as needed (pain).     allopurinol 300 MG tablet  Commonly known as:  ZYLOPRIM  Take 300 mg by mouth daily.     azithromycin 500 MG tablet  Commonly known as:  ZITHROMAX  Take by mouth daily.     citalopram 40 MG tablet  Commonly known as:  CELEXA  Take 40 mg by mouth at bedtime.     clonazePAM 2 MG tablet  Commonly known as:  KLONOPIN  Take 1-2 mg by mouth at bedtime.     diphenhydrAMINE 25 mg capsule  Commonly known as:  BENADRYL  Take 25 mg by mouth every 6 (six) hours as needed for allergies.     ibuprofen 200 MG tablet  Commonly known as:  ADVIL,MOTRIN  Take 800 mg by mouth every 6 (six) hours as needed.     Melatonin 10 MG Caps  Take 1 capsule by mouth at bedtime.     multivitamin with minerals Tabs tablet  Take 1 tablet by mouth daily.     NYQUIL PO  Take 30 mLs by mouth every 4 (four) hours.           Follow-up Information   Follow up with Flo Shanks, MD. Schedule an appointment as soon as possible for a visit in 1 week.   Specialty:  Otolaryngology   Contact information:   [address] [place] [address] [phone]       Signed: Serena Colonel 12/08/2013, 8:15 AM

## 2013-12-12 LAB — CULTURE, BLOOD (ROUTINE X 2)
Culture: NO GROWTH
Culture: NO GROWTH

## 2014-02-04 ENCOUNTER — Ambulatory Visit (HOSPITAL_COMMUNITY): Payer: 59 | Attending: Internal Medicine | Admitting: Radiology

## 2014-02-04 ENCOUNTER — Other Ambulatory Visit (HOSPITAL_COMMUNITY): Payer: Self-pay | Admitting: Internal Medicine

## 2014-02-04 DIAGNOSIS — I339 Acute and subacute endocarditis, unspecified: Secondary | ICD-10-CM

## 2014-02-04 DIAGNOSIS — Z87891 Personal history of nicotine dependence: Secondary | ICD-10-CM | POA: Insufficient documentation

## 2014-02-04 NOTE — Progress Notes (Signed)
Echocardiogram performed.  

## 2016-03-29 DIAGNOSIS — F411 Generalized anxiety disorder: Secondary | ICD-10-CM | POA: Diagnosis not present

## 2016-04-06 ENCOUNTER — Encounter: Payer: Self-pay | Admitting: Internal Medicine

## 2016-05-02 DIAGNOSIS — F411 Generalized anxiety disorder: Secondary | ICD-10-CM | POA: Diagnosis not present

## 2016-05-04 DIAGNOSIS — H524 Presbyopia: Secondary | ICD-10-CM | POA: Diagnosis not present

## 2016-05-04 DIAGNOSIS — H5203 Hypermetropia, bilateral: Secondary | ICD-10-CM | POA: Diagnosis not present

## 2016-06-05 DIAGNOSIS — F411 Generalized anxiety disorder: Secondary | ICD-10-CM | POA: Diagnosis not present

## 2016-07-02 DIAGNOSIS — F411 Generalized anxiety disorder: Secondary | ICD-10-CM | POA: Diagnosis not present

## 2016-08-14 DIAGNOSIS — F411 Generalized anxiety disorder: Secondary | ICD-10-CM | POA: Diagnosis not present

## 2016-09-20 DIAGNOSIS — F4323 Adjustment disorder with mixed anxiety and depressed mood: Secondary | ICD-10-CM | POA: Diagnosis not present

## 2016-10-04 DIAGNOSIS — F4323 Adjustment disorder with mixed anxiety and depressed mood: Secondary | ICD-10-CM | POA: Diagnosis not present

## 2016-10-11 DIAGNOSIS — F419 Anxiety disorder, unspecified: Secondary | ICD-10-CM | POA: Diagnosis not present

## 2016-10-11 DIAGNOSIS — F39 Unspecified mood [affective] disorder: Secondary | ICD-10-CM | POA: Diagnosis not present

## 2016-10-11 DIAGNOSIS — F4323 Adjustment disorder with mixed anxiety and depressed mood: Secondary | ICD-10-CM | POA: Diagnosis not present

## 2016-10-18 DIAGNOSIS — F4323 Adjustment disorder with mixed anxiety and depressed mood: Secondary | ICD-10-CM | POA: Diagnosis not present

## 2016-10-25 DIAGNOSIS — F39 Unspecified mood [affective] disorder: Secondary | ICD-10-CM | POA: Diagnosis not present

## 2016-10-25 DIAGNOSIS — F4323 Adjustment disorder with mixed anxiety and depressed mood: Secondary | ICD-10-CM | POA: Diagnosis not present

## 2016-11-01 DIAGNOSIS — F4323 Adjustment disorder with mixed anxiety and depressed mood: Secondary | ICD-10-CM | POA: Diagnosis not present

## 2016-11-15 DIAGNOSIS — F39 Unspecified mood [affective] disorder: Secondary | ICD-10-CM | POA: Diagnosis not present

## 2016-11-15 DIAGNOSIS — F4323 Adjustment disorder with mixed anxiety and depressed mood: Secondary | ICD-10-CM | POA: Diagnosis not present

## 2016-11-22 DIAGNOSIS — F4323 Adjustment disorder with mixed anxiety and depressed mood: Secondary | ICD-10-CM | POA: Diagnosis not present

## 2016-11-29 DIAGNOSIS — F4323 Adjustment disorder with mixed anxiety and depressed mood: Secondary | ICD-10-CM | POA: Diagnosis not present

## 2016-12-06 DIAGNOSIS — F4323 Adjustment disorder with mixed anxiety and depressed mood: Secondary | ICD-10-CM | POA: Diagnosis not present

## 2016-12-13 DIAGNOSIS — F39 Unspecified mood [affective] disorder: Secondary | ICD-10-CM | POA: Diagnosis not present

## 2016-12-20 DIAGNOSIS — F4323 Adjustment disorder with mixed anxiety and depressed mood: Secondary | ICD-10-CM | POA: Diagnosis not present

## 2016-12-27 DIAGNOSIS — F4323 Adjustment disorder with mixed anxiety and depressed mood: Secondary | ICD-10-CM | POA: Diagnosis not present

## 2017-01-04 DIAGNOSIS — F418 Other specified anxiety disorders: Secondary | ICD-10-CM | POA: Diagnosis not present

## 2017-01-04 DIAGNOSIS — Z6832 Body mass index (BMI) 32.0-32.9, adult: Secondary | ICD-10-CM | POA: Diagnosis not present

## 2017-01-04 DIAGNOSIS — R05 Cough: Secondary | ICD-10-CM | POA: Diagnosis not present

## 2017-01-10 DIAGNOSIS — F39 Unspecified mood [affective] disorder: Secondary | ICD-10-CM | POA: Diagnosis not present

## 2017-01-10 DIAGNOSIS — F4323 Adjustment disorder with mixed anxiety and depressed mood: Secondary | ICD-10-CM | POA: Diagnosis not present

## 2017-01-17 DIAGNOSIS — F4323 Adjustment disorder with mixed anxiety and depressed mood: Secondary | ICD-10-CM | POA: Diagnosis not present

## 2017-01-21 DIAGNOSIS — M109 Gout, unspecified: Secondary | ICD-10-CM | POA: Diagnosis not present

## 2017-01-21 DIAGNOSIS — R8299 Other abnormal findings in urine: Secondary | ICD-10-CM | POA: Diagnosis not present

## 2017-01-21 DIAGNOSIS — R7301 Impaired fasting glucose: Secondary | ICD-10-CM | POA: Diagnosis not present

## 2017-01-21 DIAGNOSIS — E559 Vitamin D deficiency, unspecified: Secondary | ICD-10-CM | POA: Diagnosis not present

## 2017-01-21 DIAGNOSIS — Z125 Encounter for screening for malignant neoplasm of prostate: Secondary | ICD-10-CM | POA: Diagnosis not present

## 2017-01-28 DIAGNOSIS — E298 Other testicular dysfunction: Secondary | ICD-10-CM | POA: Diagnosis not present

## 2017-01-28 DIAGNOSIS — R05 Cough: Secondary | ICD-10-CM | POA: Diagnosis not present

## 2017-01-28 DIAGNOSIS — Z23 Encounter for immunization: Secondary | ICD-10-CM | POA: Diagnosis not present

## 2017-01-28 DIAGNOSIS — Z1389 Encounter for screening for other disorder: Secondary | ICD-10-CM | POA: Diagnosis not present

## 2017-01-28 DIAGNOSIS — H6123 Impacted cerumen, bilateral: Secondary | ICD-10-CM | POA: Diagnosis not present

## 2017-01-28 DIAGNOSIS — L723 Sebaceous cyst: Secondary | ICD-10-CM | POA: Diagnosis not present

## 2017-01-28 DIAGNOSIS — Z6832 Body mass index (BMI) 32.0-32.9, adult: Secondary | ICD-10-CM | POA: Diagnosis not present

## 2017-01-28 DIAGNOSIS — L0291 Cutaneous abscess, unspecified: Secondary | ICD-10-CM | POA: Diagnosis not present

## 2017-01-28 DIAGNOSIS — Z0000 Encounter for general adult medical examination without abnormal findings: Secondary | ICD-10-CM | POA: Diagnosis not present

## 2017-02-08 DIAGNOSIS — F39 Unspecified mood [affective] disorder: Secondary | ICD-10-CM | POA: Diagnosis not present

## 2017-03-12 DIAGNOSIS — F39 Unspecified mood [affective] disorder: Secondary | ICD-10-CM | POA: Diagnosis not present

## 2017-04-25 DIAGNOSIS — F39 Unspecified mood [affective] disorder: Secondary | ICD-10-CM | POA: Diagnosis not present

## 2017-04-29 DIAGNOSIS — Z6831 Body mass index (BMI) 31.0-31.9, adult: Secondary | ICD-10-CM | POA: Diagnosis not present

## 2017-04-29 DIAGNOSIS — H6123 Impacted cerumen, bilateral: Secondary | ICD-10-CM | POA: Diagnosis not present

## 2017-05-02 DIAGNOSIS — F39 Unspecified mood [affective] disorder: Secondary | ICD-10-CM | POA: Diagnosis not present

## 2017-05-08 DIAGNOSIS — F39 Unspecified mood [affective] disorder: Secondary | ICD-10-CM | POA: Diagnosis not present

## 2017-05-16 DIAGNOSIS — F39 Unspecified mood [affective] disorder: Secondary | ICD-10-CM | POA: Diagnosis not present

## 2017-05-21 DIAGNOSIS — F39 Unspecified mood [affective] disorder: Secondary | ICD-10-CM | POA: Diagnosis not present

## 2017-06-07 DIAGNOSIS — F39 Unspecified mood [affective] disorder: Secondary | ICD-10-CM | POA: Diagnosis not present

## 2017-06-13 DIAGNOSIS — F39 Unspecified mood [affective] disorder: Secondary | ICD-10-CM | POA: Diagnosis not present

## 2017-06-20 DIAGNOSIS — F39 Unspecified mood [affective] disorder: Secondary | ICD-10-CM | POA: Diagnosis not present

## 2017-06-25 DIAGNOSIS — F39 Unspecified mood [affective] disorder: Secondary | ICD-10-CM | POA: Diagnosis not present

## 2017-07-15 DIAGNOSIS — F39 Unspecified mood [affective] disorder: Secondary | ICD-10-CM | POA: Diagnosis not present

## 2017-08-05 DIAGNOSIS — F39 Unspecified mood [affective] disorder: Secondary | ICD-10-CM | POA: Diagnosis not present

## 2017-08-09 DIAGNOSIS — F39 Unspecified mood [affective] disorder: Secondary | ICD-10-CM | POA: Diagnosis not present

## 2017-09-04 DIAGNOSIS — F39 Unspecified mood [affective] disorder: Secondary | ICD-10-CM | POA: Diagnosis not present

## 2017-09-11 DIAGNOSIS — R51 Headache: Secondary | ICD-10-CM | POA: Diagnosis not present

## 2017-09-11 DIAGNOSIS — H8301 Labyrinthitis, right ear: Secondary | ICD-10-CM | POA: Diagnosis not present

## 2017-09-13 DIAGNOSIS — F39 Unspecified mood [affective] disorder: Secondary | ICD-10-CM | POA: Diagnosis not present

## 2017-10-07 DIAGNOSIS — F39 Unspecified mood [affective] disorder: Secondary | ICD-10-CM | POA: Diagnosis not present

## 2017-11-01 DIAGNOSIS — F39 Unspecified mood [affective] disorder: Secondary | ICD-10-CM | POA: Diagnosis not present

## 2017-11-06 DIAGNOSIS — R5383 Other fatigue: Secondary | ICD-10-CM | POA: Diagnosis not present

## 2017-11-06 DIAGNOSIS — M7989 Other specified soft tissue disorders: Secondary | ICD-10-CM | POA: Diagnosis not present

## 2017-11-06 DIAGNOSIS — F418 Other specified anxiety disorders: Secondary | ICD-10-CM | POA: Diagnosis not present

## 2017-11-06 DIAGNOSIS — R600 Localized edema: Secondary | ICD-10-CM | POA: Diagnosis not present

## 2017-11-11 DIAGNOSIS — F39 Unspecified mood [affective] disorder: Secondary | ICD-10-CM | POA: Diagnosis not present

## 2017-11-20 DIAGNOSIS — F39 Unspecified mood [affective] disorder: Secondary | ICD-10-CM | POA: Diagnosis not present

## 2017-12-30 DIAGNOSIS — F39 Unspecified mood [affective] disorder: Secondary | ICD-10-CM | POA: Diagnosis not present

## 2018-01-27 DIAGNOSIS — F39 Unspecified mood [affective] disorder: Secondary | ICD-10-CM | POA: Diagnosis not present

## 2018-02-10 DIAGNOSIS — D23122 Other benign neoplasm of skin of left lower eyelid, including canthus: Secondary | ICD-10-CM | POA: Diagnosis not present

## 2018-02-10 DIAGNOSIS — H524 Presbyopia: Secondary | ICD-10-CM | POA: Diagnosis not present

## 2018-02-10 DIAGNOSIS — H5203 Hypermetropia, bilateral: Secondary | ICD-10-CM | POA: Diagnosis not present

## 2018-02-28 DIAGNOSIS — R7301 Impaired fasting glucose: Secondary | ICD-10-CM | POA: Diagnosis not present

## 2018-02-28 DIAGNOSIS — M109 Gout, unspecified: Secondary | ICD-10-CM | POA: Diagnosis not present

## 2018-02-28 DIAGNOSIS — Z125 Encounter for screening for malignant neoplasm of prostate: Secondary | ICD-10-CM | POA: Diagnosis not present

## 2018-02-28 DIAGNOSIS — Z0000 Encounter for general adult medical examination without abnormal findings: Secondary | ICD-10-CM | POA: Diagnosis not present

## 2018-02-28 DIAGNOSIS — R5383 Other fatigue: Secondary | ICD-10-CM | POA: Diagnosis not present

## 2018-03-07 DIAGNOSIS — R5383 Other fatigue: Secondary | ICD-10-CM | POA: Diagnosis not present

## 2018-03-07 DIAGNOSIS — R600 Localized edema: Secondary | ICD-10-CM | POA: Diagnosis not present

## 2018-03-07 DIAGNOSIS — Z1389 Encounter for screening for other disorder: Secondary | ICD-10-CM | POA: Diagnosis not present

## 2018-03-07 DIAGNOSIS — L723 Sebaceous cyst: Secondary | ICD-10-CM | POA: Diagnosis not present

## 2018-03-07 DIAGNOSIS — Z0000 Encounter for general adult medical examination without abnormal findings: Secondary | ICD-10-CM | POA: Diagnosis not present

## 2018-03-07 DIAGNOSIS — H6123 Impacted cerumen, bilateral: Secondary | ICD-10-CM | POA: Diagnosis not present

## 2018-03-14 DIAGNOSIS — Z1212 Encounter for screening for malignant neoplasm of rectum: Secondary | ICD-10-CM | POA: Diagnosis not present

## 2018-03-25 DIAGNOSIS — F39 Unspecified mood [affective] disorder: Secondary | ICD-10-CM | POA: Diagnosis not present

## 2018-04-22 DIAGNOSIS — F39 Unspecified mood [affective] disorder: Secondary | ICD-10-CM | POA: Diagnosis not present

## 2018-06-03 DIAGNOSIS — F39 Unspecified mood [affective] disorder: Secondary | ICD-10-CM | POA: Diagnosis not present

## 2018-07-15 DIAGNOSIS — F39 Unspecified mood [affective] disorder: Secondary | ICD-10-CM | POA: Diagnosis not present

## 2018-08-14 DIAGNOSIS — F39 Unspecified mood [affective] disorder: Secondary | ICD-10-CM | POA: Diagnosis not present

## 2018-08-18 ENCOUNTER — Emergency Department (INDEPENDENT_AMBULATORY_CARE_PROVIDER_SITE_OTHER): Payer: BLUE CROSS/BLUE SHIELD

## 2018-08-18 ENCOUNTER — Encounter: Payer: Self-pay | Admitting: Emergency Medicine

## 2018-08-18 ENCOUNTER — Other Ambulatory Visit: Payer: Self-pay

## 2018-08-18 ENCOUNTER — Emergency Department (INDEPENDENT_AMBULATORY_CARE_PROVIDER_SITE_OTHER)
Admission: EM | Admit: 2018-08-18 | Discharge: 2018-08-18 | Disposition: A | Discharge status: Home / Self Care | Payer: BLUE CROSS/BLUE SHIELD | Source: Home / Self Care | Attending: Family Medicine | Admitting: Family Medicine

## 2018-08-18 DIAGNOSIS — M79621 Pain in right upper arm: Secondary | ICD-10-CM | POA: Diagnosis not present

## 2018-08-18 DIAGNOSIS — S46209A Unspecified injury of muscle, fascia and tendon of other parts of biceps, unspecified arm, initial encounter: Secondary | ICD-10-CM

## 2018-08-18 DIAGNOSIS — M25511 Pain in right shoulder: Secondary | ICD-10-CM | POA: Diagnosis not present

## 2018-08-18 NOTE — ED Provider Notes (Signed)
Timothy Camacho CARE    CSN: [id number] Arrival date & time: 08/18/18  1506     History   Chief Complaint Chief Complaint  Patient presents with  . Arm Injury    HPI Timothy Camacho is a 57 y.o. male.   HPI Timothy Camacho is a 57 y.o. male presenting to UC with c/o Right upper arm pain.  Pain initially started about 3 months ago after while reaching for something at work.  Yesterday tried to lift something and pulled his biceps muscle. He is concerned it is torn because pain was moderate to severe last night and he notices a bulge where his bicep is.  Pain is mild today but worse with certain movements. He is Right hand dominant.    History reviewed. No pertinent past medical history.  Patient Active Problem List   Diagnosis Date Noted  . Peritonsillar abscess 12/06/2013    Past Surgical History:  Procedure Laterality Date  . APPENDECTOMY    . DIRECT LARYNGOSCOPY N/A 12/05/2013   Procedure: DIRECT LARYNGOSCOPY;  Surgeon: Flo Shanks, MD;  Location: [facility];  Service: ENT;  Laterality: N/A;  . TONSILLECTOMY Right 12/05/2013   Procedure: TONSILLECTOMY;  Surgeon: Flo Shanks, MD;  Location: [facility];  Service: ENT;  Laterality: Right;  . TRACHEOSTOMY TUBE PLACEMENT N/A 12/05/2013   Procedure: TRACHEOSTOMY;  Surgeon: Flo Shanks, MD;  Location: [facility];  Service: ENT;  Laterality: N/A;       Home Medications    Prior to Admission medications   Medication Sig Start Date End Date Taking? Authorizing Provider  acetaminophen (TYLENOL) 500 MG tablet Take 500 mg by mouth every 6 (six) hours as needed (pain).    [provider]  allopurinol (ZYLOPRIM) 300 MG tablet Take 300 mg by mouth daily.    [provider]  citalopram (CELEXA) 40 MG tablet Take 40 mg by mouth at bedtime.    [provider]  diphenhydrAMINE (BENADRYL) 25 mg capsule Take 25 mg by mouth every 6 (six) hours as needed for allergies.    [provider]  ibuprofen  (ADVIL,MOTRIN) 200 MG tablet Take 800 mg by mouth every 6 (six) hours as needed.    [provider]  Melatonin 10 MG CAPS Take 1 capsule by mouth at bedtime.    [provider]  Multiple Vitamin (MULTIVITAMIN WITH MINERALS) TABS tablet Take 1 tablet by mouth daily.    [provider]    Family History No family history on file.  Social History Social History   Tobacco Use  . Smoking status: Former Games developer  . Smokeless tobacco: Never Used  Substance Use Topics  . Alcohol use: Yes    Comment: every other day   . Drug use: No     Allergies   Patient has no known allergies.   Review of Systems Review of Systems  Musculoskeletal: Positive for arthralgias, joint swelling and myalgias.  Skin: Negative for color change and wound.  Neurological: Negative for weakness and numbness.     Physical Exam Triage Vital Signs ED Triage Vitals  Enc Vitals Group     BP 08/18/18 1624 130/84     Pulse Rate 08/18/18 1624 (!) 58     Resp --      Temp 08/18/18 1624 98.4 F (36.9 C)     Temp Source 08/18/18 1624 Oral     SpO2 08/18/18 1624 92 %     Weight 08/18/18 1625 233 lb (105.7 kg)  Height 08/18/18 1625 5\' 10"  (1.778 m)     Head Circumference --      Peak Flow --      Pain Score 08/18/18 1625 3     Pain Loc --      Pain Edu? --      Excl. in GC? --    No data found.  Updated Vital Signs BP 130/84 (BP Location: Right Arm)   Pulse (!) 58   Temp 98.4 F (36.9 C) (Oral)   Ht 5\' 10"  (1.778 m)   Wt 233 lb (105.7 kg)   SpO2 92%   BMI 33.43 kg/m   Visual Acuity Right Eye Distance:   Left Eye Distance:   Bilateral Distance:    Right Eye Near:   Left Eye Near:    Bilateral Near:     Physical Exam  Constitutional: He is oriented to person, place, and time. He appears well-developed and well-nourished.  HENT:  Head: Normocephalic and atraumatic.  Eyes: EOM are normal.  Neck: Normal range of motion.  Cardiovascular: Normal rate.  Pulses:       Radial pulses are 2+ on the right side.  Pulmonary/Chest: Effort normal.  Musculoskeletal: Normal range of motion. He exhibits tenderness.  Right upper arm: mild tenderness at biceps groove. Tenderness over belly of bicep.  Full ROM shoulder and elbow. No tenderness to elbow.   Neurological: He is alert and oriented to person, place, and time.  Skin: Skin is warm and dry.  Psychiatric: He has a normal mood and affect. His behavior is normal.  Nursing note and vitals reviewed.    UC Treatments / Results  Labs (all labs ordered are listed, but only abnormal results are displayed) Labs Reviewed - No data to display  EKG None  Radiology Dg Shoulder Right  Result Date: 08/18/2018 CLINICAL DATA:  Pt injured RT arm 3 months ago and heard a pop . Having pain since. No hx surg,. EXAM: RIGHT SHOULDER - 2+ VIEW COMPARISON:  Chest x-ray on 09/23/2006 FINDINGS: There is no evidence of fracture or dislocation. There is no evidence of arthropathy or other focal bone abnormality. Soft tissues are unremarkable. IMPRESSION: Negative. Electronically Signed   By: Norva Pavlov M.D.   On: 08/18/2018 16:24   Dg Humerus Right  Result Date: 08/18/2018 CLINICAL DATA:  Pt injured RT arm 3 months ago and heard a pop . Having pain since. No hx surg,. EXAM: RIGHT HUMERUS - 2+ VIEW COMPARISON:  Chest x-ray on 07/23/2009 FINDINGS: There is no evidence of fracture or other focal bone lesions. Soft tissues are unremarkable. IMPRESSION: Negative. Electronically Signed   By: Norva Pavlov M.D.   On: 08/18/2018 16:25    Procedures Procedures (including critical care time)  Medications Ordered in UC Medications - No data to display  Initial Impression / Assessment and Plan / UC Course  I have reviewed the triage vital signs and the nursing notes.  Pertinent labs & imaging results that were available during my care of the patient were reviewed by me and considered in my medical decision making (see chart for  details).     Discussed imaging with pt. Advised pt he may additional imaging such as an ultrasound or MRI depending on evaluation by orthopedist or sports medicine. Offered Ace wrap and sling but pt declined. Pt stated he is concerned about muscle atrophy.    Final Clinical Impressions(s) / UC Diagnoses   Final diagnoses:  Injury of tendon of biceps  Pain in  right upper arm     Discharge Instructions      Please call the Sports Medicine office tomorrow to schedule a follow up appointment this week for further evaluation and treatment of your Right arm injury.  In the meantime, avoid heavy lifting, pulling, lifting, reaching, with Right arm.     ED Prescriptions    None     Controlled Substance Prescriptions [place] Controlled Substance Registry consulted? Not Applicable   Rolla Plate 08/18/18 1954

## 2018-08-18 NOTE — Discharge Instructions (Signed)
°  Please call the Sports Medicine office tomorrow to schedule a follow up appointment this week for further evaluation and treatment of your Right arm injury.  In the meantime, avoid heavy lifting, pulling, lifting, reaching, with Right arm.

## 2018-08-18 NOTE — ED Triage Notes (Signed)
Right shoulder, bicep injury x 3 months ago, fell while reaching for something at work.

## 2018-08-19 ENCOUNTER — Encounter: Payer: Self-pay | Admitting: Sports Medicine

## 2018-08-19 ENCOUNTER — Ambulatory Visit (INDEPENDENT_AMBULATORY_CARE_PROVIDER_SITE_OTHER): Payer: BLUE CROSS/BLUE SHIELD | Admitting: Sports Medicine

## 2018-08-19 DIAGNOSIS — S46211A Strain of muscle, fascia and tendon of other parts of biceps, right arm, initial encounter: Secondary | ICD-10-CM | POA: Diagnosis not present

## 2018-08-19 NOTE — Progress Notes (Signed)
Subjective:    CC: Right shoulder injury  HPI:  Timothy Camacho is a pleasant 57 year old male, for some time he has had discomfort in his right anterior shoulder, worse with reaching across his chest, flexion and overhead activities.  More recently he was in the gym, reached to do a pull-up and felt a pop, immediately he had a bit of pain in the anterior shoulder with a visible and obvious deformity in the anterior upper arm.  He was seen in urgent care and diagnosed with a biceps rupture, referred here for further evaluation and definitive treatment.  I reviewed the past medical history, family history, social history, surgical history, and allergies today and no changes were needed.  Please see the problem list section below in epic for further details.  Past Medical History: No past medical history on file. Past Surgical History: Past Surgical History:  Procedure Laterality Date  . APPENDECTOMY    . DIRECT LARYNGOSCOPY N/A 12/05/2013   Procedure: DIRECT LARYNGOSCOPY;  Surgeon: Flo Shanks, MD;  Location: [facility];  Service: ENT;  Laterality: N/A;  . TONSILLECTOMY Right 12/05/2013   Procedure: TONSILLECTOMY;  Surgeon: Flo Shanks, MD;  Location: [facility];  Service: ENT;  Laterality: Right;  . TRACHEOSTOMY TUBE PLACEMENT N/A 12/05/2013   Procedure: TRACHEOSTOMY;  Surgeon: Flo Shanks, MD;  Location: [facility];  Service: ENT;  Laterality: N/A;   Social History: Social History   Socioeconomic History  . Marital status: Married    Spouse name: Not on file  . Number of children: Not on file  . Years of education: Not on file  . Highest education level: Not on file  Occupational History  . Not on file  Social Needs  . Financial resource strain: Not on file  . Food insecurity:    Worry: Not on file    Inability: Not on file  . Transportation needs:    Medical: Not on file    Non-medical: Not on file  Tobacco Use  . Smoking status: Former Games developer  . Smokeless tobacco: Never Used    Substance and Sexual Activity  . Alcohol use: Yes    Comment: every other day   . Drug use: No  . Sexual activity: Not on file  Lifestyle  . Physical activity:    Days per week: Not on file    Minutes per session: Not on file  . Stress: Not on file  Relationships  . Social connections:    Talks on phone: Not on file    Gets together: Not on file    Attends religious service: Not on file    Active member of club or organization: Not on file    Attends meetings of clubs or organizations: Not on file    Relationship status: Not on file  Other Topics Concern  . Not on file  Social History Narrative  . Not on file   Family History: Family History  Problem Relation Age of Onset  . Cancer Mother        breast   Allergies: No Known Allergies Medications: See med rec.  Review of Systems: No headache, visual changes, nausea, vomiting, diarrhea, constipation, dizziness, abdominal pain, skin rash, fevers, chills, night sweats, swollen lymph nodes, weight loss, chest pain, body aches, joint swelling, muscle aches, shortness of breath, mood changes, visual or auditory hallucinations.  Objective:    General: Well Developed, well nourished, and in no acute distress.  Neuro: Alert and oriented x3, extra-ocular muscles intact, sensation grossly intact.  HEENT: Normocephalic, atraumatic, pupils equal round reactive to light, neck supple, no masses, no lymphadenopathy, thyroid nonpalpable.  Skin: Warm and dry, no rashes noted.  Cardiac: Regular rate and rhythm, no murmurs rubs or gallops.  Respiratory: Clear to auscultation bilaterally. Not using accessory muscles, speaking in full sentences.  Abdominal: Soft, nontender, nondistended, positive bowel sounds, no masses, no organomegaly.  Right shoulder: Visible Popeye sign with only minimal tenderness, no bruising or hematoma. Palpation is normal with no tenderness over AC joint or bicipital groove. ROM is full in all planes. Rotator cuff  strength normal throughout. No signs of impingement with negative Neer and Hawkin's tests, empty can. Some weakness to speeds and Yergason tests.  No pain. No labral pathology noted with negative Obrien's, negative crank, negative clunk, and good stability. Normal scapular function observed. No painful arc and no drop arm sign. No apprehension sign  Impression and Recommendations:    The patient was counselled, risk factors were discussed, anticipatory guidance given.  Biceps rupture, proximal, right, initial encounter Continue ibuprofen 800 twice daily. Adding formal physical therapy.  These injuries generally do not require operative intervention. We will revisit this in 6 weeks. ___________________________________________ Ihor Austin. Benjamin Stain, M.D., ABFM., CAQSM. Primary Care and Sports Medicine [facility]  Adjunct Instructor of Family Medicine  [employer]

## 2018-08-19 NOTE — Assessment & Plan Note (Signed)
Continue ibuprofen 800 twice daily. Adding formal physical therapy.  These injuries generally do not require operative intervention. We will revisit this in 6 weeks.

## 2018-08-19 NOTE — Patient Instructions (Signed)
Biceps Tendon Disruption (Proximal) The proximal biceps tendon is a strong cord of tissue that connects the biceps muscle, on the front of the upper arm, to the shoulder blade. A proximal biceps tendon disruption can include a partial or complete tear of the tendon near where it connects to the bone near the shoulder. A proximal biceps tendon disruption can interfere with the ability to lift the arm in front of the body, stabilize the shoulder, bend the elbow, and turn the hand palm-up (supination). What are the causes? A biceps tendon disruption happens when the tendon is exposed to too much force. This excess force may be caused by:  The elbow being suddenly straightened from a bent position because of an external force. This could happen, for example, while catching a heavy weight or being pulled when waterskiing.  Wear and tear from physical activity.  Breaking a fall with your hand.  What increases the risk? The following factors may make you more likely to develop this condition:  Playing contact sports.  Doing activities or sports that involve throwing or overhead movements, such as racket sports, gymnastics, or baseball.  Doing activities or sports that involve putting sudden force on the arm, such as weightlifting or waterskiing.  Having a weakened tendon. The tendon may be weak because of: ? Long-lasting (chronic) biceps tendinitis. ? Certain medical conditions, such as diabetes or rheumatoid arthritis. ? Repeated corticosteroid use. ? Repetitive overhead movements.  What are the signs or symptoms? Symptoms of this condition may include:  Sudden sharp pain in the front of the shoulder. Pain may get worse during certain movements, such as: ? Lifting or carrying objects. ? Straightening the elbow. ? Throwing or using overhead movements.  Inflammation or a feeling of unusual warmth on the front of the shoulder.  Painful tightening (spasm) of the biceps muscle.  A bulge on  the inside of the upper arm when the elbow is bent.  Bruising in the shoulder or upper arm. This may develop 24-48 hours after the tendon is injured.  Limited range of motion of the shoulder and elbow.  Weakness in the elbow and forearm when: ? Bending the elbow. ? Rotating the wrist.  How is this diagnosed? This condition is diagnosed based on your symptoms, your medical history, and a physical exam. Your health care provider may test the strength and range of motion of your shoulder and elbow. You may have imaging tests, such as X-rays, MRI, or ultrasound. How is this treated? This condition is treated by resting and icing the injured area, and by doing physical therapy exercises. Depending on the severity of your condition, treatment may also include:  Medicines to help relieve pain and inflammation.  Avoiding certain activities that put stress on your shoulder.  One or more injections of medicines (corticosteroids) into your upper arm to help reduce inflammation (rare).  Surgery to repair the tear. This may be needed if nonsurgical treatments do not improve your condition.  Follow these instructions at home: Managing pain, stiffness, and swelling  If directed, put ice on the injured area: ? Put ice in a plastic bag. ? Place a towel between your skin and the bag. ? Leave the ice on for 20 minutes, 2-3 times a day.  Move your fingers often to avoid stiffness and to lessen swelling.  Raise (elevate) the injured area while you are sitting or lying down. Activity  Return to your normal activities as told by your health care provider. Ask your health   care provider what activities are safe for you.  Avoid activities that cause pain or make your condition worse.  Do not lift anything that is heavier than 10 lb (4.5 kg) until your health care provider approves.  Do exercises as told by your health care provider. General instructions  Take over-the-counter and prescription  medicines only as told by your health care provider.  Do not drive or operate heavy machinery while taking prescription pain medicines.  Keep all follow-up visits as told by your health care provider. This is important. How is this prevented?  Warm up and stretch before being active.  Cool down and stretch after being active.  Give your body time to rest between periods of activity.  Make sure to use equipment that fits you.  Be safe and responsible while being active to avoid falls.  Maintain physical fitness, including strength and flexibility. Contact a health care provider if:  You have symptoms that get worse or do not get better after 2 weeks of treatment.  You develop new symptoms. Get help right away if:  You have severe pain.  You develop pain or numbness in your hand.  Your hand feels unusually cold.  Your fingernails turn a dark color, such as blue or gray. This information is not intended to replace advice given to you by your health care provider. Make sure you discuss any questions you have with your health care provider. Document Released: 12/03/2005 Document Revised: 08/09/2016 Document Reviewed: 11/11/2015 Elsevier Interactive Patient Education  2018 Elsevier Inc.  

## 2018-08-21 ENCOUNTER — Ambulatory Visit (INDEPENDENT_AMBULATORY_CARE_PROVIDER_SITE_OTHER): Payer: BLUE CROSS/BLUE SHIELD | Admitting: Physical Therapy

## 2018-08-21 ENCOUNTER — Encounter: Payer: Self-pay | Admitting: Physical Therapy

## 2018-08-21 DIAGNOSIS — M25511 Pain in right shoulder: Secondary | ICD-10-CM | POA: Diagnosis not present

## 2018-08-21 DIAGNOSIS — M25611 Stiffness of right shoulder, not elsewhere classified: Secondary | ICD-10-CM | POA: Diagnosis not present

## 2018-08-21 DIAGNOSIS — R293 Abnormal posture: Secondary | ICD-10-CM

## 2018-08-21 NOTE — Patient Instructions (Signed)
Access Code: [email or web address]  URL: [email or web address]  Date: 08/21/2018  Prepared by: Moshe Cipro   Exercises  Isometric Shoulder Abduction at Wall - 20 reps - 1 sets - 5 sec hold - 1x daily - 7x weekly  Isometric Shoulder Flexion at Wall - 20 reps - 1 sets - 5 sec hold - 1x daily - 7x weekly  Standing Isometric Shoulder Internal Rotation at Doorway - 20 reps - 1 sets - 5 sec hold - 1x daily - 7x weekly

## 2018-08-21 NOTE — Therapy (Signed)
[facility] [place] [address] [place], [place], [address] Phone: [phone]   Fax:  [phone]  Physical Therapy Evaluation  Patient Details  Name: Timothy Camacho MRN: [id number] Date of Birth: [birth year] Referring Provider: Monica Becton, MD   Encounter Date: 08/21/2018  PT End of Session - 08/21/18 1523    Visit Number  1    Number of Visits  6    Date for PT Re-Evaluation  10/02/18    PT Start Time  1445    PT Stop Time  1522    PT Time Calculation (min)  37 min    Activity Tolerance  Patient tolerated treatment well    Behavior During Therapy  [facility] for tasks assessed/performed       History reviewed. No pertinent past medical history.  Past Surgical History:  Procedure Laterality Date  . APPENDECTOMY    . DIRECT LARYNGOSCOPY N/A 12/05/2013   Procedure: DIRECT LARYNGOSCOPY;  Surgeon: Flo Shanks, MD;  Location: [facility];  Service: ENT;  Laterality: N/A;  . TONSILLECTOMY Right 12/05/2013   Procedure: TONSILLECTOMY;  Surgeon: Flo Shanks, MD;  Location: [facility];  Service: ENT;  Laterality: Right;  . TRACHEOSTOMY TUBE PLACEMENT N/A 12/05/2013   Procedure: TRACHEOSTOMY;  Surgeon: Flo Shanks, MD;  Location: [facility];  Service: ENT;  Laterality: N/A;    There were no vitals filed for this visit.   Subjective Assessment - 08/21/18 1447    Subjective  Pt is a 57 y/o male who presents to OPPT for Rt biceps tendon rupture.  Pt reports initial injury 3-4 months ago when having to catch self from a fall, then 2nd injury on 08/17/18 when reaching up for pull up bar.  Pt reports mild pain and tenderness, and some continued weakness.    Patient Stated Goals  improve pain; maintain full function    Currently in Pain?  Yes    Pain Score  1    up to 6/10; at best 1/10   Pain Location  Arm    Pain Orientation  Right    Pain Descriptors / Indicators  Aching;Dull;Sharp    Pain Type  Acute pain    Pain Onset  In the past  7 days    Pain Frequency  Constant    Aggravating Factors   quick forward reaching    Pain Relieving Factors  ice, ibuprofen    Effect of Pain on Daily Activities  difficulty sleeping         [facility] PT Assessment - 08/21/18 1452      Assessment   Medical Diagnosis  S46.211A (ICD-10-CM) - Biceps rupture, proximal, right, initial encounter    Referring Provider  Monica Becton, MD    Onset Date/Surgical Date  08/17/18    Hand Dominance  Right    Next MD Visit  09/30/18    Prior Therapy  none      Precautions   Precautions  None      Restrictions   Weight Bearing Restrictions  No      Balance Screen   Has the patient fallen in the past 6 months  Yes    How many times?  1    Has the patient had a decrease in activity level because of a fear of falling?   No    Is the patient reluctant to leave their home because of a fear of falling?   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)    Leisure  [employer]      Cognition   Overall Cognitive Status  Within Functional Limits for tasks assessed      Observation/Other Assessments   Focus on Therapeutic Outcomes (FOTO)   63 (37% limited; predicted 19% limited)      Posture/Postural Control   Posture/Postural Control  Postural limitations    Postural Limitations  Rounded Shoulders;Forward head      ROM / Strength   AROM / PROM / Strength  AROM;Strength      AROM   Overall AROM Comments  Rt shoulder WNL except functional IR to T11/12      Strength   Strength Assessment Site  Shoulder;Elbow    Right/Left Shoulder  Right;Left    Right Shoulder Flexion  3/5    Right Shoulder Extension  5/5    Right Shoulder ABduction  3+/5    Right Shoulder Internal Rotation  4/5   with pain   Right Shoulder External Rotation  4/5     Left Shoulder Flexion  5/5    Left Shoulder ABduction  5/5    Left Shoulder Internal Rotation  5/5    Left Shoulder External Rotation  5/5    Right/Left Elbow  Right;Left    Right Elbow Flexion  5/5    Right Elbow Extension  5/5    Left Elbow Flexion  5/5    Left Elbow Extension  5/5      Palpation   Palpation comment  popeye sign on Rt biceps                Objective measurements completed on examination: See above findings.      [facility] Adult PT Treatment/Exercise - 08/21/18 1452      Exercises   Exercises  Shoulder      Shoulder Exercises: Isometric Strengthening   Flexion  3X5"    Flexion Limitations  for HEP instruction    Internal Rotation  3X5"    Internal Rotation Limitations  for HEP instruction    ABduction  3X5"    ABduction Limitations  for HEP instruction             PT Education - 08/21/18 1523    Education Details  HEP    Person(s) Educated  Patient    Methods  Explanation;Demonstration;Handout    Comprehension  Verbalized understanding;Returned demonstration;Need further instruction          PT Long Term Goals - 08/21/18 1526      PT LONG TERM GOAL #1   Title  independent with HEP    Status  New    Target Date  10/02/18      PT LONG TERM GOAL #2   Title  FOTO score improved to </= 20% limitation for improved function    Status  New    Target Date  10/02/18      PT LONG TERM GOAL #3   Title  improve Rt shoulder flexion and abduction strength to at least 4/5 for improved function    Status  New    Target Date  10/02/18      PT LONG TERM GOAL #4   Title  improve functional internal rotation to T8 for improved function and  ADLs    Status  New    Target Date  10/02/18             Plan - 08/21/18 1524    Clinical Impression Statement  Pt is a 57 y/o male who presents to OPPT for Rt proximal biceps rupture on 08/17/18.  Pt demonstrates mild pain and ROM deficits and decreased Rt shoulder strength.  Pt will benefit from  PT to address deficits listed.    Clinical Presentation  Stable    Clinical Decision Making  Low    Rehab Potential  Good    PT Frequency  1x / week    PT Duration  6 weeks    PT Treatment/Interventions  ADLs/Self Care Home Management;Cryotherapy;Electrical Stimulation;Moist Heat;Therapeutic exercise;Therapeutic activities;Ultrasound;Neuromuscular re-education;Patient/family education;Manual techniques;Taping;Dry needling;Passive range of motion    PT Next Visit Plan  review isometrics and progress resistance exercises as able, manual/modalities PRN    PT Home Exercise Plan  Access Code: [email or web address]       Patient will benefit from skilled therapeutic intervention in order to improve the following deficits and impairments:  Postural dysfunction, Decreased strength, Increased fascial restricitons, Increased muscle spasms, Pain, Impaired UE functional use  Visit Diagnosis: Acute pain of right shoulder - Plan: PT plan of care cert/re-cert  Stiffness of right shoulder, not elsewhere classified - Plan: PT plan of care cert/re-cert  Abnormal posture - Plan: PT plan of care cert/re-cert     Problem List Patient Active Problem List   Diagnosis Date Noted  . Biceps rupture, proximal, right, initial encounter 08/19/2018  . Peritonsillar abscess 12/06/2013      Clarita Crane, PT, DPT 08/21/18 3:31 PM     [facility] [address] [place], [place], [address] Phone: [phone]   Fax:  [phone]  Name: Timothy Camacho MRN: [id number] Date of Birth: December 01, 1961

## 2018-08-25 ENCOUNTER — Encounter: Payer: BLUE CROSS/BLUE SHIELD | Admitting: Physical Therapy

## 2018-08-27 ENCOUNTER — Encounter: Payer: Self-pay | Admitting: Physical Therapy

## 2018-08-27 ENCOUNTER — Ambulatory Visit (INDEPENDENT_AMBULATORY_CARE_PROVIDER_SITE_OTHER): Payer: BLUE CROSS/BLUE SHIELD | Admitting: Physical Therapy

## 2018-08-27 DIAGNOSIS — M25611 Stiffness of right shoulder, not elsewhere classified: Secondary | ICD-10-CM

## 2018-08-27 DIAGNOSIS — M25511 Pain in right shoulder: Secondary | ICD-10-CM

## 2018-08-27 DIAGNOSIS — R293 Abnormal posture: Secondary | ICD-10-CM

## 2018-08-27 NOTE — Therapy (Signed)
[facility] [place] [address] [place], [place], [address] Phone: [phone]   Fax:  [phone]  Physical Therapy Treatment  Patient Details  Name: Timothy Camacho MRN: [id number] Date of Birth: [birth year] Referring Provider: Monica Becton, MD   Encounter Date: 08/27/2018  PT End of Session - 08/27/18 0756    Visit Number  2    Number of Visits  6    Date for PT Re-Evaluation  10/02/18    PT Start Time  0715    PT Stop Time  0809    PT Time Calculation (min)  54 min    Activity Tolerance  Patient tolerated treatment well    Behavior During Therapy  [facility] for tasks assessed/performed       History reviewed. No pertinent past medical history.  Past Surgical History:  Procedure Laterality Date  . APPENDECTOMY    . DIRECT LARYNGOSCOPY N/A 12/05/2013   Procedure: DIRECT LARYNGOSCOPY;  Surgeon: Flo Shanks, MD;  [facility];  Service: ENT;  Laterality: N/A;  . TONSILLECTOMY Right 12/05/2013   Procedure: TONSILLECTOMY;  Surgeon: Flo Shanks, MD;  [facility];  Service: ENT;  Laterality: Right;  . TRACHEOSTOMY TUBE PLACEMENT N/A 12/05/2013   Procedure: TRACHEOSTOMY;  Surgeon: Flo Shanks, MD;  [facility];  Service: ENT;  Laterality: N/A;    There were no vitals filed for this visit.  Subjective Assessment - 08/27/18 0715    Subjective  having some more pain in shoulder; described as aching.  feels like "I don't have any muscle at the top of the shoulder."    Patient Stated Goals  improve pain; maintain full function    Currently in Pain?  Yes    Pain Score  3     Pain Location  Arm    Pain Orientation  Right    Pain Descriptors / Indicators  Aching;Dull    Pain Type  Acute pain    Pain Onset  In the past 7 days    Pain Frequency  Constant    Aggravating Factors   quick forward reaching    Pain Relieving Factors  ice, ibuprofen                       [facility] Adult PT  Treatment/Exercise - 08/27/18 0719      Exercises   Exercises  Shoulder      Shoulder Exercises: Standing   External Rotation  Right;20 reps;Theraband    Theraband Level (Shoulder External Rotation)  Level 2 (Red)    Internal Rotation  Right;20 reps;Theraband    Theraband Level (Shoulder Internal Rotation)  Level 2 (Red)    Row  20 reps;Theraband    Theraband Level (Shoulder Row)  Level 2 (Red)      Shoulder Exercises: ROM/Strengthening   UBE (Upper Arm Bike)  L4 x 4 min (2' fwd/2' bwd)      Shoulder Exercises: Isometric Strengthening   Flexion Limitations  5"x10    Internal Rotation Limitations  5"x10    ABduction Limitations  5"x10      Shoulder Exercises: Stretch   Other Shoulder Stretches  3 way doorway stretch 2x30 sec each      Modalities   Modalities  Electrical Stimulation;Cryotherapy      Cryotherapy   Number Minutes Cryotherapy  15 Minutes    Cryotherapy Location  Shoulder    Type of Cryotherapy  Ice pack      Electrical Stimulation  Electrical Stimulation Location  Rt shoulder    Electrical Stimulation Action  IFC    Electrical Stimulation Parameters  to tolerance x 15 min    Electrical Stimulation Goals  Pain                  PT Long Term Goals - 08/21/18 1526      PT LONG TERM GOAL #1   Title  independent with HEP    Status  New    Target Date  10/02/18      PT LONG TERM GOAL #2   Title  FOTO score improved to </= 20% limitation for improved function    Status  New    Target Date  10/02/18      PT LONG TERM GOAL #3   Title  improve Rt shoulder flexion and abduction strength to at least 4/5 for improved function    Status  New    Target Date  10/02/18      PT LONG TERM GOAL #4   Title  improve functional internal rotation to T8 for improved function and ADLs    Status  New    Target Date  10/02/18            Plan - 08/27/18 0756    Clinical Impression Statement  Pt tolerated exercises well today needing min cues for  posture, with min c/o soreness and fatigue following.  Utilized modalities to help with pain.  Will continue to benefit from PT to maximize function.    Rehab Potential  Good    PT Frequency  1x / week    PT Duration  6 weeks    PT Treatment/Interventions  ADLs/Self Care Home Management;Cryotherapy;Electrical Stimulation;Moist Heat;Therapeutic exercise;Therapeutic activities;Ultrasound;Neuromuscular re-education;Patient/family education;Manual techniques;Taping;Dry needling;Passive range of motion    PT Next Visit Plan  review isometrics and progress resistance exercises as able, manual/modalities PRN; add band exercises to HEP if pt tolerated well    PT Home Exercise Plan  Access Code: [email or web address]       Patient will benefit from skilled therapeutic intervention in order to improve the following deficits and impairments:  Postural dysfunction, Decreased strength, Increased fascial restricitons, Increased muscle spasms, Pain, Impaired UE functional use  Visit Diagnosis: Acute pain of right shoulder  Stiffness of right shoulder, not elsewhere classified  Abnormal posture     Problem List Patient Active Problem List   Diagnosis Date Noted  . Biceps rupture, proximal, right, initial encounter 08/19/2018  . Peritonsillar abscess 12/06/2013      Clarita Crane, PT, DPT 08/27/18 7:59 AM    [facility] [address] [place], [place], [address] Phone: [phone]   Fax:  [phone]  Name: Timothy Camacho MRN: [id number] Date of Birth: October 21, 1961

## 2018-08-28 ENCOUNTER — Encounter: Payer: BLUE CROSS/BLUE SHIELD | Admitting: Physical Therapy

## 2018-09-01 ENCOUNTER — Encounter: Payer: BLUE CROSS/BLUE SHIELD | Admitting: Physical Therapy

## 2018-09-03 ENCOUNTER — Ambulatory Visit (INDEPENDENT_AMBULATORY_CARE_PROVIDER_SITE_OTHER): Payer: BLUE CROSS/BLUE SHIELD | Admitting: Physical Therapy

## 2018-09-03 ENCOUNTER — Encounter: Payer: Self-pay | Admitting: Physical Therapy

## 2018-09-03 DIAGNOSIS — M25511 Pain in right shoulder: Secondary | ICD-10-CM

## 2018-09-03 DIAGNOSIS — R7301 Impaired fasting glucose: Secondary | ICD-10-CM | POA: Diagnosis not present

## 2018-09-03 DIAGNOSIS — R293 Abnormal posture: Secondary | ICD-10-CM | POA: Diagnosis not present

## 2018-09-03 DIAGNOSIS — Z23 Encounter for immunization: Secondary | ICD-10-CM | POA: Diagnosis not present

## 2018-09-03 DIAGNOSIS — F418 Other specified anxiety disorders: Secondary | ICD-10-CM | POA: Diagnosis not present

## 2018-09-03 DIAGNOSIS — M109 Gout, unspecified: Secondary | ICD-10-CM | POA: Diagnosis not present

## 2018-09-03 DIAGNOSIS — G2581 Restless legs syndrome: Secondary | ICD-10-CM | POA: Diagnosis not present

## 2018-09-03 DIAGNOSIS — M25611 Stiffness of right shoulder, not elsewhere classified: Secondary | ICD-10-CM | POA: Diagnosis not present

## 2018-09-03 NOTE — Therapy (Signed)
[place] [facility] [place], [place], [address] Phone: [phone]   Fax:  [phone]  Physical Therapy Treatment  Patient Details  Name: Timothy Camacho MRN: [id number] Date of Birth: 1961-03-27 Referring Provider: Silverio Decamp, MD   Encounter Date: 09/03/2018  PT End of Session - 09/03/18 0752    Visit Number  3    Number of Visits  6    Date for PT Re-Evaluation  10/02/18    PT Start Time  0710    PT Stop Time  0752    PT Time Calculation (min)  42 min    Activity Tolerance  Patient tolerated treatment well    Behavior During Therapy  [facility] for tasks assessed/performed       History reviewed. No pertinent past medical history.  Past Surgical History:  Procedure Laterality Date  . APPENDECTOMY    . DIRECT LARYNGOSCOPY N/A 12/05/2013   Procedure: DIRECT LARYNGOSCOPY;  Surgeon: Jodi Marble, MD;  [facility];  Service: ENT;  Laterality: N/A;  . TONSILLECTOMY Right 12/05/2013   Procedure: TONSILLECTOMY;  Surgeon: Jodi Marble, MD;  [facility];  Service: ENT;  Laterality: Right;  . TRACHEOSTOMY TUBE PLACEMENT N/A 12/05/2013   Procedure: TRACHEOSTOMY;  Surgeon: Jodi Marble, MD;  [facility]: [facility];  Service: ENT;  Laterality: N/A;    There were no vitals filed for this visit.  Subjective Assessment - 09/03/18 0709    Subjective  having a dull ache in the biceps; but otherwise doing well; sleeping at night is about 50% better    Patient Stated Goals  improve pain; maintain full function    Currently in Pain?  Yes    Pain Score  3     Pain Location  Arm    Pain Orientation  Right    Pain Descriptors / Indicators  Aching;Dull    Pain Type  Acute pain    Pain Onset  1 to 4 weeks ago    Pain Frequency  Constant    Aggravating Factors   quick forward reaching    Pain Relieving Factors  ice, ibuprofen                       [facility] Adult PT Treatment/Exercise - 09/03/18 0713       Exercises   Exercises  Shoulder      Shoulder Exercises: Supine   Protraction  Right;20 reps;Weights    Protraction Weight (lbs)  5    Horizontal ABduction  Both;20 reps;Theraband    Theraband Level (Shoulder Horizontal ABduction)  Level 2 (Red)    Flexion  Right;20 reps;Weights    Shoulder Flexion Weight (lbs)  3      Shoulder Exercises: Sidelying   ABduction  Right;20 reps;Weights    ABduction Weight (lbs)  3      Shoulder Exercises: Standing   Protraction  Right;20 reps;Theraband    Theraband Level (Shoulder Protraction)  Level 2 (Red)    External Rotation  Right;20 reps;Theraband    Theraband Level (Shoulder External Rotation)  Level 2 (Red)    Internal Rotation  Right;20 reps;Theraband    Theraband Level (Shoulder Internal Rotation)  Level 2 (Red)    Row  20 reps;Theraband    Theraband Level (Shoulder Row)  Level 2 (Red)      Shoulder Exercises: ROM/Strengthening   UBE (Upper Arm Bike)  L4 x 4 min (2' fwd/2' bwd)    Rhythmic Stabilization, Supine  5#; circles x 20 each; A-Z      Shoulder Exercises: Stretch   Other Shoulder Stretches  low doorway stretch 3 x 30 sec; cues for proper technique                  PT Long Term Goals - 08/21/18 1526      PT LONG TERM GOAL #1   Title  independent with HEP    Status  New    Target Date  10/02/18      PT LONG TERM GOAL #2   Title  FOTO score improved to </= 20% limitation for improved function    Status  New    Target Date  10/02/18      PT LONG TERM GOAL #3   Title  improve Rt shoulder flexion and abduction strength to at least 4/5 for improved function    Status  New    Target Date  10/02/18      PT LONG TERM GOAL #4   Title  improve functional internal rotation to T8 for improved function and ADLs    Status  New    Target Date  10/02/18            Plan - 09/03/18 0752    Clinical Impression Statement  Pt reports 50% improvement in sleeping and overall doing well.  Reports Rt shoulder  still feels weaker which is to be expected due to injury.  Demonstrates independence with current HEP.  No goals met to date.    Rehab Potential  Good    PT Frequency  1x / week    PT Duration  6 weeks    PT Treatment/Interventions  ADLs/Self Care Home Management;Cryotherapy;Electrical Stimulation;Moist Heat;Therapeutic exercise;Therapeutic activities;Ultrasound;Neuromuscular re-education;Patient/family education;Manual techniques;Taping;Dry needling;Passive range of motion    PT Next Visit Plan  review isometrics and progress resistance exercises as able, manual/modalities PRN; add weight exercises to HEP; check goals and FOTO    PT Home Exercise Plan  Access Code: [email or web address]       Patient will benefit from skilled therapeutic intervention in order to improve the following deficits and impairments:  Postural dysfunction, Decreased strength, Increased fascial restricitons, Increased muscle spasms, Pain, Impaired UE functional use  Visit Diagnosis: Acute pain of right shoulder  Stiffness of right shoulder, not elsewhere classified  Abnormal posture     Problem List Patient Active Problem List   Diagnosis Date Noted  . Biceps rupture, proximal, right, initial encounter 08/19/2018  . Peritonsillar abscess 12/06/2013      Laureen Abrahams, PT, DPT 09/03/18 7:54 AM    [facility] [place], [place], [address] Phone: [phone]   Fax:  [phone]  Name: JOHANNA STAFFORD MRN: [id number] Date of Birth: September 20, 1961

## 2018-09-08 ENCOUNTER — Encounter: Payer: BLUE CROSS/BLUE SHIELD | Admitting: Physical Therapy

## 2018-09-10 ENCOUNTER — Ambulatory Visit (INDEPENDENT_AMBULATORY_CARE_PROVIDER_SITE_OTHER): Payer: BLUE CROSS/BLUE SHIELD | Admitting: Physical Therapy

## 2018-09-10 ENCOUNTER — Encounter: Payer: Self-pay | Admitting: Physical Therapy

## 2018-09-10 DIAGNOSIS — M25611 Stiffness of right shoulder, not elsewhere classified: Secondary | ICD-10-CM | POA: Diagnosis not present

## 2018-09-10 DIAGNOSIS — M25511 Pain in right shoulder: Secondary | ICD-10-CM | POA: Diagnosis not present

## 2018-09-10 DIAGNOSIS — R293 Abnormal posture: Secondary | ICD-10-CM

## 2018-09-10 NOTE — Therapy (Addendum)
Timothy Camacho, [place], [address] Phone: [phone]   Fax:  [phone]  Physical Therapy Treatment/Discharge  Patient Details  Name: Timothy Camacho MRN: [id number] Date of Birth: 1961/02/26 Referring Provider: Silverio Decamp, MD   Encounter Date: 09/10/2018  PT End of Session - 09/10/18 0756    Visit Number  4    Number of Visits  6    Date for PT Re-Evaluation  10/02/18    PT Start Time  0712    PT Stop Time  0755    PT Time Calculation (min)  43 min    Activity Tolerance  Patient tolerated treatment well    Behavior During Therapy  [facility] for tasks assessed/performed       History reviewed. No pertinent past medical history.  Past Surgical History:  Procedure Laterality Date  . APPENDECTOMY    . DIRECT LARYNGOSCOPY N/A 12/05/2013   Procedure: DIRECT LARYNGOSCOPY;  Surgeon: Jodi Marble, MD;  [facility];  Service: ENT;  Laterality: N/A;  . TONSILLECTOMY Right 12/05/2013   Procedure: TONSILLECTOMY;  Surgeon: Jodi Marble, MD;  [facility];  Service: ENT;  Laterality: Right;  . TRACHEOSTOMY TUBE PLACEMENT N/A 12/05/2013   Procedure: TRACHEOSTOMY;  Surgeon: Jodi Marble, MD;  [facility];  Service: ENT;  Laterality: N/A;    There were no vitals filed for this visit.  Subjective Assessment - 09/10/18 0713    Subjective  repetitive movements aggravate the shoulder and having some pain on the top and back of shoulder with reaching out and up but otherwise doing well    Patient Stated Goals  improve pain; maintain full function    Pain Score  1     Pain Location  Arm    Pain Orientation  Right    Pain Descriptors / Indicators  Aching;Dull    Pain Type  Acute pain    Pain Onset  1 to 4 weeks ago    Pain Frequency  Constant    Aggravating Factors   quick forward reaching, repetitive movements    Pain Relieving Factors  ice, ibuprofen         [facility] PT Assessment -  09/10/18 0728      Assessment   Medical Diagnosis  S46.211A (ICD-10-CM) - Biceps rupture, proximal, right, initial encounter    Referring Provider  Silverio Decamp, MD      Observation/Other Assessments   Focus on Therapeutic Outcomes (FOTO)   74 (26% limited)      Strength   Right Shoulder Flexion  4/5    Right Shoulder ABduction  4-/5                   [facility] Adult PT Treatment/Exercise - 09/10/18 0715      Exercises   Exercises  Shoulder      Shoulder Exercises: Supine   Protraction  Right;20 reps;Weights    Protraction Weight (lbs)  5    Flexion  Right;20 reps;Weights    Shoulder Flexion Weight (lbs)  3      Shoulder Exercises: Sidelying   External Rotation  Right;20 reps;Weights    External Rotation Weight (lbs)  3    ABduction  Right;20 reps;Weights    ABduction Weight (lbs)  3      Shoulder Exercises: Standing   Protraction  Right;20 reps;Theraband    Theraband Level (Shoulder Protraction)  Level 3 (Green)    Extension  Both;20 reps;Theraband  Theraband Level (Shoulder Extension)  Level 3 (Green)    Row  20 reps;Theraband    Theraband Level (Shoulder Row)  Level 3 (Green)      Shoulder Exercises: ROM/Strengthening   UBE (Upper Arm Bike)  L4 x 4 min (2' fwd/2' bwd)             PT Education - 09/10/18 0756    Education Details  updated HEP    Person(s) Educated  Patient    Methods  Explanation;Demonstration;Handout    Comprehension  Verbalized understanding;Returned demonstration          PT Long Term Goals - 09/10/18 0756      PT LONG TERM GOAL #1   Title  independent with HEP    Status  Achieved      PT LONG TERM GOAL #2   Title  FOTO score improved to </= 20% limitation for improved function    Baseline  9/25: 26% limitation    Status  On-going      PT LONG TERM GOAL #3   Title  improve Rt shoulder flexion and abduction strength to at least 4/5 for improved function    Status  Achieved      PT LONG TERM GOAL #4    Title  improve functional internal rotation to T8 for improved function and ADLs    Baseline  9/25: T10/11    Status  On-going            Plan - 09/10/18 0720    Clinical Impression Statement  Pt has met 2/4 LTGs and demonstrated improvement in other goals but not to goal yet.  Overall doing well and reports minimal limitations with functional mobility.  Plan to transition to HEP and hold PT, and pt to follow up with MD for additional recommendations.    PT Treatment/Interventions  ADLs/Self Care Home Management;Cryotherapy;Electrical Stimulation;Moist Heat;Therapeutic exercise;Therapeutic activities;Ultrasound;Neuromuscular re-education;Patient/family education;Manual techniques;Taping;Dry needling;Passive range of motion    PT Next Visit Plan  hold x 30 days; reassess PRN    PT Home Exercise Plan  Access Code: [email or web address]       Patient will benefit from skilled therapeutic intervention in order to improve the following deficits and impairments:  Postural dysfunction, Decreased strength, Increased fascial restricitons, Increased muscle spasms, Pain, Impaired UE functional use  Visit Diagnosis: Acute pain of right shoulder  Stiffness of right shoulder, not elsewhere classified  Abnormal posture     Problem List Patient Active Problem List   Diagnosis Date Noted  . Biceps rupture, proximal, right, initial encounter 08/19/2018  . Peritonsillar abscess 12/06/2013      Stephanie F Matthews, PT, DPT 09/10/18 8:00 AM    [facility] [address] [place], [place], [address] Phone: [phone]   Fax:  [phone]  Name: Timothy Camacho MRN: [id number] Date of Birth: 09/04/1961    PHYSICAL THERAPY DISCHARGE SUMMARY  Visits from Start of Care: 4  Current functional level related to goals / functional outcomes: See above   Remaining deficits: See above   Education / Equipment: HEP  Plan: Patient agrees to  discharge.  Patient goals were partially met. Patient is being discharged due to being pleased with the current functional level.  ?????     Stephanie F Matthews, PT, DPT 10/14/18 8:45 AM  [facility] [address] [place], [place] [address]  [phone] (office) [phone] (fax)  

## 2018-09-10 NOTE — Patient Instructions (Signed)
Access Code: [email or web address]  URL: [email or web address]  Date: 09/10/2018  Prepared by: Moshe CiproStephanie Evy Lutterman   Exercises  Scapular Retraction with Resistance - 10 reps - 2 sets - 5 sec hold - 1x daily - 7x weekly  Shoulder Internal Rotation with Resistance - 10 reps - 2 sets - 1x daily - 7x weekly  Shoulder External Rotation with Anchored Resistance - 10 reps - 2 sets - 1x daily - 7x weekly  Doorway Pec Stretch at 90 Degrees Abduction - 10 reps - 3 sets - 1x daily - 7x weekly  Supine Single Arm Shoulder Protraction - 10 reps - 3 sets - 1x daily - 7x weekly  Supine Shoulder Flexion with Free Weight - 10 reps - 3 sets - 1x daily - 7x weekly  Sidelying Shoulder ER with Towel and Dumbbell - 10 reps - 3 sets - 1x daily - 7x weekly  Shoulder extension with resistance - Neutral - 10 reps - 3 sets - 1x daily - 7x weekly  Single Arm Punch with Resistance - 10 reps - 3 sets - 1x daily - 7x weekly

## 2018-09-15 ENCOUNTER — Encounter: Payer: BLUE CROSS/BLUE SHIELD | Admitting: Physical Therapy

## 2018-09-29 ENCOUNTER — Encounter: Payer: Self-pay | Admitting: Emergency Medicine

## 2018-09-29 DIAGNOSIS — F1020 Alcohol dependence, uncomplicated: Secondary | ICD-10-CM | POA: Insufficient documentation

## 2018-09-29 DIAGNOSIS — F419 Anxiety disorder, unspecified: Secondary | ICD-10-CM

## 2018-09-29 DIAGNOSIS — F39 Unspecified mood [affective] disorder: Secondary | ICD-10-CM | POA: Insufficient documentation

## 2018-09-30 ENCOUNTER — Ambulatory Visit: Payer: BLUE CROSS/BLUE SHIELD | Admitting: Sports Medicine

## 2018-10-06 ENCOUNTER — Ambulatory Visit (INDEPENDENT_AMBULATORY_CARE_PROVIDER_SITE_OTHER): Payer: BLUE CROSS/BLUE SHIELD | Admitting: Sports Medicine

## 2018-10-06 ENCOUNTER — Encounter: Payer: Self-pay | Admitting: Sports Medicine

## 2018-10-06 DIAGNOSIS — S46211A Strain of muscle, fascia and tendon of other parts of biceps, right arm, initial encounter: Secondary | ICD-10-CM | POA: Diagnosis not present

## 2018-10-06 NOTE — Progress Notes (Signed)
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  Does have an additional minor pain that he localizes over his deltoid, worse with internal rotation and abduction.  Mild, persistent, localized without radiation, does not hurt enough to consider interventional treatment.  I reviewed the past medical history, family history, social history, surgical history, and allergies today and no changes were needed.  Please see the problem list section below in epic for further details.  Past Medical History: No past medical history on file. Past Surgical History: Past Surgical History:  Procedure Laterality Date  . APPENDECTOMY    . DIRECT LARYNGOSCOPY N/A 12/05/2013   Procedure: DIRECT LARYNGOSCOPY;  Surgeon: Flo Shanks, MD;  Location: [facility];  Service: ENT;  Laterality: N/A;  . TONSILLECTOMY Right 12/05/2013   Procedure: TONSILLECTOMY;  Surgeon: Flo Shanks, MD;  Location: [facility];  Service: ENT;  Laterality: Right;  . TRACHEOSTOMY TUBE PLACEMENT N/A 12/05/2013   Procedure: TRACHEOSTOMY;  Surgeon: Flo Shanks, MD;  Location: [facility];  Service: ENT;  Laterality: N/A;   Social History: Social History   Socioeconomic History  . Marital status: Married    Spouse name: Not on file  . Number of children: Not on file  . Years of education: Not on file  . Highest education level: Not on file  Occupational History  . Not on file  Social Needs  . Financial resource strain: Not on file  . Food insecurity:    Worry: Not on file    Inability: Not on file  . Transportation needs:    Medical: Not on file    Non-medical: Not on file  Tobacco Use  . Smoking status: Former Games developer  . Smokeless tobacco: Never Used  Substance and Sexual Activity  . Alcohol use: Yes    Comment: every other day   . Drug use: No  . Sexual activity: Not on file  Lifestyle  .  Physical activity:    Days per week: Not on file    Minutes per session: Not on file  . Stress: Not on file  Relationships  . Social connections:    Talks on phone: Not on file    Gets together: Not on file    Attends religious service: Not on file    Active member of club or organization: Not on file    Attends meetings of clubs or organizations: Not on file    Relationship status: Not on file  Other Topics Concern  . Not on file  Social History Narrative  . Not on file   Family History: Family History  Problem Relation Age of Onset  . Cancer Mother        breast   Allergies: No Known Allergies Medications: See med rec.  Review of Systems: No fevers, chills, night sweats, weight loss, chest pain, or shortness of breath.   Objective:    General: Well Developed, well nourished, and in no acute distress.  Neuro: Alert and oriented x3, extra-ocular muscles intact, sensation grossly intact.  HEENT: Normocephalic, atraumatic, pupils equal round reactive to light, neck supple, no masses, no lymphadenopathy, thyroid nonpalpable.  Skin: Warm and dry, no rashes. Cardiac: Regular rate and rhythm, no murmurs rubs or gallops, no lower extremity edema.  Respiratory: Clear to auscultation bilaterally. Not using accessory muscles, speaking in full sentences. Right shoulder: Inspection reveals no abnormalities, atrophy or asymmetry.  Palpation is normal with no tenderness over AC joint or bicipital groove. ROM is full in all planes. Rotator cuff strength normal throughout. Positive Neer and Hawkin's tests, empty can. Positive liftoff test. Speeds and Yergason's tests normal. No labral pathology noted with negative Obrien's, negative crank, negative clunk, and good stability. Normal scapular function observed. No painful arc and no drop arm sign. No apprehension sign  Impression and Recommendations:    Biceps rupture, proximal, right, initial encounter All bicipital pain has  resolved, he is done well with physical therapy. Still has some rotator cuff impingement symptoms, specifically subscapularis, this is common is the long head of the biceps does stabilize the subscapularis insertion. We discussed aggressive rehabilitation exercises as well as activity modification and avoiding overhead activities if at all possible. In the gym he will do more shoulder shrugs and chest press as opposed to overhead and [facility]. Return as needed, subacromial injection/subcoracoid injection if no better. ___________________________________________ Ihor Austin. Benjamin Stain, M.D., ABFM., CAQSM. Primary Care and Sports Medicine [facility]  Adjunct Professor of Family Medicine  [employer]

## 2018-10-06 NOTE — Assessment & Plan Note (Signed)
All bicipital pain has resolved, he is done well with physical therapy. Still has some rotator cuff impingement symptoms, specifically subscapularis, this is common is the long head of the biceps does stabilize the subscapularis insertion. We discussed aggressive rehabilitation exercises as well as activity modification and avoiding overhead activities if at all possible. In the gym he will do more shoulder shrugs and chest press as opposed to overhead and [facility]. Return as needed, subacromial injection/subcoracoid injection if no better.

## 2018-10-07 ENCOUNTER — Encounter: Payer: Self-pay | Admitting: Psychiatry

## 2018-10-07 ENCOUNTER — Ambulatory Visit (INDEPENDENT_AMBULATORY_CARE_PROVIDER_SITE_OTHER): Payer: BLUE CROSS/BLUE SHIELD | Admitting: Psychiatry

## 2018-10-07 VITALS — BP 114/75 | HR 77

## 2018-10-07 DIAGNOSIS — F419 Anxiety disorder, unspecified: Secondary | ICD-10-CM | POA: Diagnosis not present

## 2018-10-07 DIAGNOSIS — F39 Unspecified mood [affective] disorder: Secondary | ICD-10-CM

## 2018-10-07 NOTE — Progress Notes (Signed)
Timothy Camacho [id number] Apr 20, 1961 57 y.o.  Subjective:   Patient ID:  Timothy Camacho is a 57 y.o. (DOB 1961/07/01) male.  Chief Complaint:  Chief Complaint  Patient presents with  . Follow-up    h/o depression, anxiety, sleep disturbance    HPI CHANCELER PULLIN presents to the office today for follow-up of depression and anxiety. Reports that he has periods of frustration. "At the end of the day, just not happy." Reports that he is able to enjoy one of his hobbies. Reports that he continues to have difficulty with focus and notices he is more forgetful. Reports that he has to write himself notes about where he left off in a task or he would have difficulty remembering. Reports that he does not recall information the same way as he did in the past. "Nothing seems to be fun." Reports that he will become irritable when he feels challenged or anxious about his abilities. Reports that he experiences some affective dulling, such as not experiencing any emotion when recalling significant events in his family. Denies persistent sad mood- "I don't feel normal." Reports motivation and energy have been low- "I do what I have to do." Reports that the most he drinks is 4 beers and "when I do, that's the most normal" that he feels and that energy and motivation is highest. Reports that he goes to bed early some nights "because I just don't feel like sitting up." Reports that he has repeated awakenings after 2 am. Continues to notice some RLS. Denies SI.   Reports leg and joint aches improved with d/c of Trazodone.   Past Medication Trials: Lexapro Celexa Wellbutrin XL Olanzapine Risperdal Trileptal Lamictal Gabapentin Topamax Naltrexone Klonopin NAC  Medications: I have reviewed the patient's current medications.  Current Outpatient Medications  Medication Sig Dispense Refill  . allopurinol (ZYLOPRIM) 300 MG tablet Take 300 mg by mouth daily.    . busPIRone (BUSPAR) 15 MG tablet Take 15 mg  by mouth 2 (two) times daily.  0  . Cholecalciferol (VITAMIN D) 2000 units CAPS Take by mouth.    . Cyanocobalamin (VITAMIN B-12) 1000 MCG/15ML LIQD Take by mouth.    . escitalopram (LEXAPRO) 20 MG tablet Take 20 mg by mouth daily.     Marland Kitchen ibuprofen (ADVIL,MOTRIN) 200 MG tablet Take 800 mg by mouth every 6 (six) hours as needed.    . Multiple Vitamin (MULTIVITAMIN WITH MINERALS) TABS tablet Take 1 tablet by mouth daily.    . pramipexole (MIRAPEX) 0.5 MG tablet TAKE 1 TABLET BY MOUTH 2-3 HOURS BEFORE BEDTIME  1  . Pyridoxine HCl (VITAMIN B-6 PO) Take by mouth.    Marland Kitchen acetaminophen (TYLENOL) 500 MG tablet Take 500 mg by mouth every 6 (six) hours as needed (pain).    Marland Kitchen L-Methylfolate-Algae (DEPLIN 15 PO) Take 15 mg by mouth daily.     No current facility-administered medications for this visit.     Medication Side Effects: None  Allergies: No Known Allergies  Past Medical History:  Diagnosis Date  . Gout   . RLS (restless legs syndrome)     Family History  Problem Relation Age of Onset  . Cancer Mother        breast  . Alcohol abuse Father   . CVA Maternal Aunt   . Suicidality Maternal Uncle   . Alcohol abuse Paternal Uncle     Social History   Socioeconomic History  . Marital status: Married    Spouse name: Not on  file  . Number of children: Not on file  . Years of education: Not on file  . Highest education level: Not on file  Occupational History  . Not on file  Social Needs  . Financial resource strain: Not on file  . Food insecurity:    Worry: Not on file    Inability: Not on file  . Transportation needs:    Medical: Not on file    Non-medical: Not on file  Tobacco Use  . Smoking status: Former Games developer  . Smokeless tobacco: Never Used  Substance and Sexual Activity  . Alcohol use: Yes    Comment: every other day   . Drug use: No  . Sexual activity: Not on file  Lifestyle  . Physical activity:    Days per week: Not on file    Minutes per session: Not on file   . Stress: Not on file  Relationships  . Social connections:    Talks on phone: Not on file    Gets together: Not on file    Attends religious service: Not on file    Active member of club or organization: Not on file    Attends meetings of clubs or organizations: Not on file    Relationship status: Not on file  . Intimate partner violence:    Fear of current or ex partner: Not on file    Emotionally abused: Not on file    Physically abused: Not on file    Forced sexual activity: Not on file  Other Topics Concern  . Not on file  Social History Narrative  . Not on file    Past Medical History, Surgical history, Social history, and Family history were reviewed and updated as appropriate.   Please see review of systems for further details on the patient's review from today.   Review of Systems:  Review of Systems  Musculoskeletal: Negative for gait problem.       Reports pain secondary to torn bicep tendon  Neurological: Negative for tremors.       RLS  Psychiatric/Behavioral: Positive for dysphoric mood and sleep disturbance. Negative for suicidal ideas.    Objective:   Physical Exam:  BP 114/75   Pulse 77   Physical Exam  Constitutional: He is oriented to person, place, and time. He appears well-developed. No distress.  Musculoskeletal: He exhibits no deformity.  Neurological: He is alert and oriented to person, place, and time. Coordination normal.  Psychiatric: His speech is normal and behavior is normal. Judgment and thought content normal. His mood appears anxious. His affect is not angry, not blunt, not labile and not inappropriate. Cognition and memory are impaired. He exhibits a depressed mood. He expresses no homicidal and no suicidal ideation. He expresses no suicidal plans and no homicidal plans. He exhibits normal recent memory and normal remote memory.  Dysphoric mood. Constricted affect    Lab Review:     Component Value Date/Time   CREATININE 1.09  12/06/2013 0119   GFRNONAA 77 (L) 12/06/2013 0119   GFRAA 89 (L) 12/06/2013 0119       Component Value Date/Time   WBC 12.2 (H) 12/07/2013 0348   RBC 3.61 (L) 12/07/2013 0348   HGB 11.5 (L) 12/07/2013 0348   HCT 34.7 (L) 12/07/2013 0348   PLT 198 12/07/2013 0348   MCV 96.1 12/07/2013 0348   MCH 31.9 12/07/2013 0348   MCHC 33.1 12/07/2013 0348   RDW 14.0 12/07/2013 0348   LYMPHSABS 2.2 12/07/2013 0348  MONOABS 1.4 (H) 12/07/2013 0348   EOSABS 0.2 12/07/2013 0348   BASOSABS 0.0 12/07/2013 0348    No results found for: POCLITH, LITHIUM   No results found for: PHENYTOIN, PHENOBARB, VALPROATE, CBMZ   .res Assessment: Plan:   Pt seen for 30 minutes and greater than 50% of session spent counseling pt re: potential benefits, risks, side effects of Trintellix since this may be helpful depression as well as cognitive issues since it has an indication for cognitive processing speed.  Also discussed that patient is describing possible affect of dulling and this could potentially be a side effect of Lexapro.  Discussed decreasing Lexapro to 10 mg daily for 1 week and then discontinuing to minimize risk of discontinuation signs and symptoms, while starting Trintellix 5 mg daily for 1 week and then increasing to 10 mg daily.  Will continue pramipexole for restless legs and BuSpar for anxiety.  Recommend continuing therapy with Stevphen Meuse, LPC.  Patient to follow-up with this provider in 5 weeks or sooner if clinically indicated. Mood disorder (HCC)  Anxiety disorder, unspecified type  Please see After Visit Summary for patient specific instructions.  Future Appointments  Date Time Provider Department Center  11/10/2018  2:00 PM Corie Chiquito, PMHNP [facility] None    No orders of the defined types were placed in this encounter.     -------------------------------

## 2018-10-07 NOTE — Patient Instructions (Addendum)
Take Trintellix 5 mg daily with evening meal for one week, then increase to 10 mg daily.  Decrease Lexapro to 20 mg 1/2 tablet daily for one week (while taking Trintellix 5 mg daily), then discontinue.

## 2018-10-15 ENCOUNTER — Telehealth: Payer: Self-pay | Admitting: Psychiatry

## 2018-10-15 DIAGNOSIS — G2581 Restless legs syndrome: Secondary | ICD-10-CM

## 2018-10-15 MED ORDER — PRAMIPEXOLE DIHYDROCHLORIDE 0.5 MG PO TABS: 0.7250 mg | ORAL_TABLET | Freq: Every evening | ORAL | 1 refills | Status: DC

## 2018-10-15 NOTE — Telephone Encounter (Signed)
Pt.notified

## 2018-11-05 ENCOUNTER — Other Ambulatory Visit: Payer: Self-pay

## 2018-11-05 MED ORDER — BUSPIRONE HCL 15 MG PO TABS: ORAL_TABLET | ORAL | 0 refills | Status: DC

## 2018-11-10 ENCOUNTER — Ambulatory Visit: Payer: BLUE CROSS/BLUE SHIELD | Admitting: Psychiatry

## 2018-11-11 ENCOUNTER — Encounter: Payer: Self-pay | Admitting: Psychiatry

## 2018-11-11 ENCOUNTER — Ambulatory Visit (INDEPENDENT_AMBULATORY_CARE_PROVIDER_SITE_OTHER): Payer: BLUE CROSS/BLUE SHIELD | Admitting: Psychiatry

## 2018-11-11 VITALS — BP 125/79 | HR 69

## 2018-11-11 DIAGNOSIS — F39 Unspecified mood [affective] disorder: Secondary | ICD-10-CM

## 2018-11-11 DIAGNOSIS — G2581 Restless legs syndrome: Secondary | ICD-10-CM | POA: Diagnosis not present

## 2018-11-11 DIAGNOSIS — F419 Anxiety disorder, unspecified: Secondary | ICD-10-CM

## 2018-11-11 MED ORDER — PRAMIPEXOLE DIHYDROCHLORIDE 0.5 MG PO TABS: 0.7250 mg | ORAL_TABLET | Freq: Every evening | ORAL | 1 refills | Status: DC

## 2018-11-11 MED ORDER — ESCITALOPRAM OXALATE 20 MG PO TABS: 20.0000 mg | ORAL_TABLET | Freq: Every day | ORAL | 5 refills | Status: DC

## 2018-11-11 NOTE — Progress Notes (Signed)
Timothy LabellaDonald G Molloy [id number] Apr 25, 1961 57 y.o.  Subjective:   Patient ID:  Timothy LabellaDonald G Bartolini is a 57 y.o. (DOB Apr 25, 1961) male.  Chief Complaint:  Chief Complaint  Patient presents with  . Depression  . Anxiety    HPI Timothy Camacho presents to the office today for follow-up of depression and anxiety after changing Lexapro to Trintellix at last visit. He reports that he has low energy and low motivation. He reports that he does not feel sad "I just don't care." He reports increased irritability and reports that he has had episodes of anger. He reports that he has little patience for certain things. He thinks that anxiety manifests as irritability. "I feel like I have no control over anything." He reports feeling of "impending doom" at work. Reports that he cannot function if he takes Buspar 1.5 tabs po BID. He reports that he is able to function some with 1 tab po BID. Reports concentration is likely worse and that thoughts now seem to be slowed. Reports that he is easily distracted. Reports that he recently could not recall the lyrics to songs he has known his entire life. Reports lifelong difficulty with concentration. Reports feelings of hopelessness. Reports adequate sleep. No change in appetite. Denies SI.   He reports that he has not had any ETOH for the last few weeks and denies ETOH cravings.    Review of Systems:  Review of Systems  HENT: Positive for tinnitus.   Gastrointestinal: Positive for nausea.  Musculoskeletal: Negative for gait problem.  Neurological: Negative for tremors.  Psychiatric/Behavioral:       Please refer to HPI    Medications: I have reviewed the patient's current medications.  Current Outpatient Medications  Medication Sig Dispense Refill  . acetaminophen (TYLENOL) 500 MG tablet Take 500 mg by mouth every 6 (six) hours as needed (pain).    Marland Kitchen. allopurinol (ZYLOPRIM) 300 MG tablet Take 300 mg by mouth daily.    . Cholecalciferol (VITAMIN D) 2000 units  CAPS Take by mouth.    . Cyanocobalamin (VITAMIN B-12) 1000 MCG/15ML LIQD Take by mouth.    Marland Kitchen. ibuprofen (ADVIL,MOTRIN) 200 MG tablet Take 800 mg by mouth every 6 (six) hours as needed.    Marland Kitchen. L-Methylfolate-Algae (DEPLIN 15 PO) Take 15 mg by mouth daily.    . Multiple Vitamin (MULTIVITAMIN WITH MINERALS) TABS tablet Take 1 tablet by mouth daily.    . pramipexole (MIRAPEX) 0.5 MG tablet Take 1.5 tablets (0.75 mg total) by mouth every evening. 135 tablet 1  . Pyridoxine HCl (VITAMIN B-6 PO) Take by mouth.    . escitalopram (LEXAPRO) 20 MG tablet Take 1 tablet (20 mg total) by mouth daily. 30 tablet 5   No current facility-administered medications for this visit.     Medication Side Effects: Nausea  Allergies: No Known Allergies  Past Medical History:  Diagnosis Date  . Gout   . RLS (restless legs syndrome)     Family History  Problem Relation Age of Onset  . Cancer Mother        breast  . Alcohol abuse Father   . CVA Maternal Aunt   . Suicidality Maternal Uncle   . Alcohol abuse Paternal Uncle     Social History   Socioeconomic History  . Marital status: Married    Spouse name: Not on file  . Number of children: Not on file  . Years of education: Not on file  . Highest education level: Not on file  Occupational History  . Not on file  Social Needs  . Financial resource strain: Not on file  . Food insecurity:    Worry: Not on file    Inability: Not on file  . Transportation needs:    Medical: Not on file    Non-medical: Not on file  Tobacco Use  . Smoking status: Former Games developer  . Smokeless tobacco: Never Used  Substance and Sexual Activity  . Alcohol use: Yes    Comment: every other day   . Drug use: No  . Sexual activity: Not on file  Lifestyle  . Physical activity:    Days per week: Not on file    Minutes per session: Not on file  . Stress: Not on file  Relationships  . Social connections:    Talks on phone: Not on file    Gets together: Not on file     Attends religious service: Not on file    Active member of club or organization: Not on file    Attends meetings of clubs or organizations: Not on file    Relationship status: Not on file  . Intimate partner violence:    Fear of current or ex partner: Not on file    Emotionally abused: Not on file    Physically abused: Not on file    Forced sexual activity: Not on file  Other Topics Concern  . Not on file  Social History Narrative  . Not on file    Past Medical History, Surgical history, Social history, and Family history were reviewed and updated as appropriate.   Please see review of systems for further details on the patient's review from today.   Objective:   Physical Exam:  BP 125/79   Pulse 69   Physical Exam  Constitutional: He is oriented to person, place, and time. He appears well-developed. No distress.  Musculoskeletal: He exhibits no deformity.  Neurological: He is alert and oriented to person, place, and time. Coordination normal.  Psychiatric: His speech is normal and behavior is normal. Judgment and thought content normal. His mood appears anxious. His affect is not angry, not blunt, not labile and not inappropriate. Cognition and memory are normal. He exhibits a depressed mood. He expresses no homicidal and no suicidal ideation. He expresses no suicidal plans and no homicidal plans.  Dysphoric mood.  Insight intact. No auditory or visual hallucinations. No delusions.     Lab Review:     Component Value Date/Time   CREATININE 1.09 12/06/2013 0119   GFRNONAA 77 (L) 12/06/2013 0119   GFRAA 89 (L) 12/06/2013 0119       Component Value Date/Time   WBC 12.2 (H) 12/07/2013 0348   RBC 3.61 (L) 12/07/2013 0348   HGB 11.5 (L) 12/07/2013 0348   HCT 34.7 (L) 12/07/2013 0348   PLT 198 12/07/2013 0348   MCV 96.1 12/07/2013 0348   MCH 31.9 12/07/2013 0348   MCHC 33.1 12/07/2013 0348   RDW 14.0 12/07/2013 0348   LYMPHSABS 2.2 12/07/2013 0348   MONOABS 1.4 (H)  12/07/2013 0348   EOSABS 0.2 12/07/2013 0348   BASOSABS 0.0 12/07/2013 0348    No results found for: POCLITH, LITHIUM   No results found for: PHENYTOIN, PHENOBARB, VALPROATE, CBMZ   .res Assessment: Plan:   - Stop Trintellix - Start lexapro 20 mg 1/2 tablet daily for one week, then increase 1 tablet daily. -Decrease Buspar to 2/3 tablet twice daily for 5 days, then 1/3 tablet twice daily for  5 days, then stop. Mild mood disorder (HCC) - Plan: escitalopram (LEXAPRO) 20 MG tablet  Anxiety - Plan: escitalopram (LEXAPRO) 20 MG tablet  RLS (restless legs syndrome) - Plan: pramipexole (MIRAPEX) 0.5 MG tablet  Please see After Visit Summary for patient specific instructions.  Future Appointments  Date Time Provider Department Center  12/25/2018  4:30 PM Corie Chiquito, PMHNP [facility] None    No orders of the defined types were placed in this encounter.     -------------------------------

## 2018-11-11 NOTE — Patient Instructions (Signed)
-   Stop Trintellix - Start lexapro 20 mg 1/2 tablet daily for one week, then increase 1 tablet daily. -Decrease Buspar to 2/3 tablet twice daily for 5 days, then 1/3 tablet twice daily for 5 days, then stop.

## 2018-12-25 ENCOUNTER — Encounter: Payer: Self-pay | Admitting: Psychiatry

## 2018-12-25 ENCOUNTER — Ambulatory Visit (INDEPENDENT_AMBULATORY_CARE_PROVIDER_SITE_OTHER): Payer: 59 | Admitting: Psychiatry

## 2018-12-25 VITALS — BP 115/75 | HR 83

## 2018-12-25 DIAGNOSIS — F419 Anxiety disorder, unspecified: Secondary | ICD-10-CM

## 2018-12-25 DIAGNOSIS — F900 Attention-deficit hyperactivity disorder, predominantly inattentive type: Secondary | ICD-10-CM | POA: Diagnosis not present

## 2018-12-25 DIAGNOSIS — F39 Unspecified mood [affective] disorder: Secondary | ICD-10-CM | POA: Diagnosis not present

## 2018-12-25 MED ORDER — METHYLPHENIDATE HCL ER (OSM) 36 MG PO TBCR: 36.0000 mg | EXTENDED_RELEASE_TABLET | Freq: Every day | ORAL | 0 refills | Status: DC

## 2018-12-25 NOTE — Progress Notes (Signed)
Timothy Camacho [id number] 1961/03/07 58 y.o.  Subjective:   Patient ID:  Timothy Camacho is a 58 y.o. (DOB 1961-08-10) male.  Chief Complaint:  Chief Complaint  Patient presents with  . ADD  . Anxiety  . Depression    HPI Timothy Camacho presents to the office today for follow-up of anxiety and depression. "I have a clearer head" since last vist when Buspar was decreased and d/c'd and Trintellix was changed back to Lexapro.  He reports that he continues to have some anxiety when there are multiple things going on and describes feeling distracted and overwhelmed. Reports that he has to stop tasks at times due to anxiety. Reports that at times he feels his rate rate increase. Denies excessive worry or anxious thoughts. Reports at times anxiety manifests as irritability- "I think I am more short-fused than I have ever been." Reports that he has less patience for certain things and increased frustration. Reports that he now feels as if he needs quiet time to decompress and never felt that way before.   Describes mood as "ok." Reports that his motivation is ok. Reports energy is low. Reports that he has not been able to exercise recently.  He reports sleeping an increased amount. Reports going to bed at 9 pm and awakening shortly after 5 am. Reports appetite is stable. Denies SI.   Past Medication Trials: Lexapro Celexa Wellbutrin XL Olanzapine Risperdal Trileptal Lamictal Gabapentin Topamax Naltrexone Klonopin NAC   Review of Systems:  Review of Systems  HENT: Positive for tinnitus.   Musculoskeletal: Negative for arthralgias, gait problem and myalgias.  Neurological: Negative for tremors.  Psychiatric/Behavioral:       Please refer to HPI    Medications: I have reviewed the patient's current medications.  Current Outpatient Medications  Medication Sig Dispense Refill  . acetaminophen (TYLENOL) 500 MG tablet Take 500 mg by mouth every 6 (six) hours as needed (pain).     Marland Kitchen allopurinol (ZYLOPRIM) 300 MG tablet Take 300 mg by mouth daily.    . Cholecalciferol (VITAMIN D) 2000 units CAPS Take by mouth.    . Cyanocobalamin (VITAMIN B-12) 1000 MCG/15ML LIQD Take by mouth.    Marland Kitchen ibuprofen (ADVIL,MOTRIN) 200 MG tablet Take 800 mg by mouth every 6 (six) hours as needed.    . pramipexole (MIRAPEX) 0.5 MG tablet Take 1.5 tablets (0.75 mg total) by mouth every evening. 135 tablet 1  . Pyridoxine HCl (VITAMIN B-6 PO) Take by mouth.    . THEANINE PO Take by mouth.    . escitalopram (LEXAPRO) 20 MG tablet Take 1 tablet (20 mg total) by mouth daily. 30 tablet 5  . methylphenidate (CONCERTA) 36 MG PO CR tablet Take 1 tablet (36 mg total) by mouth daily. 30 tablet 0  . Multiple Vitamin (MULTIVITAMIN WITH MINERALS) TABS tablet Take 1 tablet by mouth daily.     No current facility-administered medications for this visit.     Medication Side Effects: None  Allergies: No Known Allergies  Past Medical History:  Diagnosis Date  . Gout   . RLS (restless legs syndrome)     Family History  Problem Relation Age of Onset  . Cancer Mother        breast  . Alcohol abuse Father   . CVA Maternal Aunt   . Suicidality Maternal Uncle   . Alcohol abuse Paternal Uncle     Social History   Socioeconomic History  . Marital status: Married    Spouse  name: Not on file  . Number of children: Not on file  . Years of education: Not on file  . Highest education level: Not on file  Occupational History  . Not on file  Social Needs  . Financial resource strain: Not on file  . Food insecurity:    Worry: Not on file    Inability: Not on file  . Transportation needs:    Medical: Not on file    Non-medical: Not on file  Tobacco Use  . Smoking status: Former Games developer  . Smokeless tobacco: Never Used  Substance and Sexual Activity  . Alcohol use: Yes    Comment: every other day   . Drug use: No  . Sexual activity: Not on file  Lifestyle  . Physical activity:    Days per  week: Not on file    Minutes per session: Not on file  . Stress: Not on file  Relationships  . Social connections:    Talks on phone: Not on file    Gets together: Not on file    Attends religious service: Not on file    Active member of club or organization: Not on file    Attends meetings of clubs or organizations: Not on file    Relationship status: Not on file  . Intimate partner violence:    Fear of current or ex partner: Not on file    Emotionally abused: Not on file    Physically abused: Not on file    Forced sexual activity: Not on file  Other Topics Concern  . Not on file  Social History Narrative  . Not on file    Past Medical History, Surgical history, Social history, and Family history were reviewed and updated as appropriate.   Please see review of systems for further details on the patient's review from today.   Objective:   Physical Exam:  BP 115/75   Pulse 83   Physical Exam Constitutional:      General: He is not in acute distress.    Appearance: He is well-developed.  Musculoskeletal:        General: No deformity.  Neurological:     Mental Status: He is alert and oriented to person, place, and time.     Coordination: Coordination normal.  Psychiatric:        Mood and Affect: Mood is anxious. Affect is not labile, blunt, angry or inappropriate.        Speech: Speech normal.        Behavior: Behavior normal.        Thought Content: Thought content normal. Thought content does not include homicidal or suicidal ideation. Thought content does not include homicidal or suicidal plan.        Judgment: Judgment normal.     Comments: Dysphoric mood Insight intact. No auditory or visual hallucinations. No delusions.      Lab Review:     Component Value Date/Time   CREATININE 1.09 12/06/2013 0119   GFRNONAA 77 (L) 12/06/2013 0119   GFRAA 89 (L) 12/06/2013 0119       Component Value Date/Time   WBC 12.2 (H) 12/07/2013 0348   RBC 3.61 (L) 12/07/2013  0348   HGB 11.5 (L) 12/07/2013 0348   HCT 34.7 (L) 12/07/2013 0348   PLT 198 12/07/2013 0348   MCV 96.1 12/07/2013 0348   MCH 31.9 12/07/2013 0348   MCHC 33.1 12/07/2013 0348   RDW 14.0 12/07/2013 0348   LYMPHSABS 2.2 12/07/2013 0348  MONOABS 1.4 (H) 12/07/2013 0348   EOSABS 0.2 12/07/2013 0348   BASOSABS 0.0 12/07/2013 0348    No results found for: POCLITH, LITHIUM   No results found for: PHENYTOIN, PHENOBARB, VALPROATE, CBMZ   .res Assessment: Plan:   Discussed potential benefits, risks, and side effects of stimulants with patient to include increased heart rate, palpitations, insomnia, increased anxiety, increased irritability, or decreased appetite.  Instructed patient to contact office if experiencing any significant tolerability issues.  Discussed specific potential benefits, risk, and side effects of Concerta.  Patient agrees to trial of Concerta.  Will start Concerta 36 mg p.o. every morning for concentration. Continue Lexapro 20 mg daily for anxiety and depression. Continue Mirapex as needed for restless legs. Attention deficit hyperactivity disorder (ADHD), predominantly inattentive type - Unstable - Plan: methylphenidate (CONCERTA) 36 MG PO CR tablet  Mood disorder (HCC) - Current dysphoric signs and symptoms  Anxiety disorder, unspecified type - Chronic  Please see After Visit Summary for patient specific instructions.  Future Appointments  Date Time Provider Department Center  01/22/2019  3:45 PM Corie Chiquitoarter, Vaughn Beaumier, PMHNP [facility] None    No orders of the defined types were placed in this encounter.     -------------------------------

## 2019-01-02 ENCOUNTER — Encounter: Payer: Self-pay | Admitting: *Deleted

## 2019-01-02 ENCOUNTER — Emergency Department (INDEPENDENT_AMBULATORY_CARE_PROVIDER_SITE_OTHER)
Admission: EM | Admit: 2019-01-02 | Discharge: 2019-01-02 | Disposition: A | Discharge status: Home / Self Care | Payer: PRIVATE HEALTH INSURANCE | Source: Home / Self Care | Attending: Emergency Medicine | Admitting: Emergency Medicine

## 2019-01-02 DIAGNOSIS — J029 Acute pharyngitis, unspecified: Secondary | ICD-10-CM

## 2019-01-02 HISTORY — DX: Personal history of other diseases of the respiratory system: Z87.09

## 2019-01-02 LAB — POCT INFLUENZA A/B
Influenza A, POC: NEGATIVE
Influenza B, POC: NEGATIVE

## 2019-01-02 LAB — POCT RAPID STREP A (OFFICE): Rapid Strep A Screen: NEGATIVE

## 2019-01-02 MED ORDER — AMOXICILLIN-POT CLAVULANATE 875-125 MG PO TABS: 1.0000 | ORAL_TABLET | Freq: Two times a day (BID) | ORAL | 0 refills | Status: DC

## 2019-01-02 NOTE — ED Triage Notes (Signed)
Sore throat and bilateral ear fullness with muted hearing x3 days.

## 2019-01-02 NOTE — ED Provider Notes (Signed)
[facility]    CSN: [id number] Arrival date & time: 01/02/19  0809     History   Chief Complaint Chief Complaint  Patient presents with  . Sore Throat  . Ear Fullness    HPI Timothy Camacho is a 58 y.o. male.  Patient presents with a sore throat of 3 days duration.  He has not had a documented fever but has felt like he was feverish at times.  He has had aching.  He has had his flu shot.  He does feel as though his ears are clogged.  He has a minimal cough.  Pertinent to this is in 2014 he developed a right peritonsillar abscess which resulted in surgery and necessitated placement of a tracheostomy. HPI  Past Medical History:  Diagnosis Date  . Gout   . History of peritonsillar abscess   . RLS (restless legs syndrome)     Patient Active Problem List   Diagnosis Date Noted  . Mild mood disorder (HCC) 09/29/2018  . Anxiety 09/29/2018  . Biceps rupture, proximal, right, initial encounter 08/19/2018  . Peritonsillar abscess 12/06/2013    Past Surgical History:  Procedure Laterality Date  . APPENDECTOMY    . DIRECT LARYNGOSCOPY N/A 12/05/2013   Procedure: DIRECT LARYNGOSCOPY;  Surgeon: Flo ShanksKarol Wolicki, MD;  Location: [facility];  Service: ENT;  Laterality: N/A;  . TONSILLECTOMY Right 12/05/2013   Procedure: TONSILLECTOMY;  Surgeon: Flo ShanksKarol Wolicki, MD;  Location: [facility];  Service: ENT;  Laterality: Right;  . TRACHEOSTOMY TUBE PLACEMENT N/A 12/05/2013   Procedure: TRACHEOSTOMY;  Surgeon: Flo ShanksKarol Wolicki, MD;  Location: [facility];  Service: ENT;  Laterality: N/A;       Home Medications    Prior to Admission medications   Medication Sig Start Date End Date Taking? Authorizing Provider  allopurinol (ZYLOPRIM) 300 MG tablet Take 300 mg by mouth daily.   Yes [provider]  Cholecalciferol (VITAMIN D) 2000 units CAPS Take by mouth.   Yes [provider]  Cyanocobalamin (VITAMIN B-12) 1000 MCG/15ML LIQD Take by mouth.   Yes [provider]    methylphenidate (CONCERTA) 36 MG PO CR tablet Take 1 tablet (36 mg total) by mouth daily. 12/25/18 01/24/19 Yes Corie Chiquitoarter, Jessica, PMHNP  Multiple Vitamin (MULTIVITAMIN WITH MINERALS) TABS tablet Take 1 tablet by mouth daily.   Yes [provider]  pramipexole (MIRAPEX) 0.5 MG tablet Take 1.5 tablets (0.75 mg total) by mouth every evening. 11/11/18 02/09/19 Yes Corie Chiquitoarter, Jessica, PMHNP  Pyridoxine HCl (VITAMIN B-6 PO) Take by mouth.   Yes [provider]  THEANINE PO Take by mouth.   Yes [provider]  acetaminophen (TYLENOL) 500 MG tablet Take 500 mg by mouth every 6 (six) hours as needed (pain).    [provider]  amoxicillin-clavulanate (AUGMENTIN) 875-125 MG tablet Take 1 tablet by mouth every 12 (twelve) hours. 01/02/19   Collene Gobbleaub, Elayah Klooster A, MD  escitalopram (LEXAPRO) 20 MG tablet Take 1 tablet (20 mg total) by mouth daily. 11/11/18 12/11/18  Corie Chiquitoarter, Jessica, PMHNP  ibuprofen (ADVIL,MOTRIN) 200 MG tablet Take 800 mg by mouth every 6 (six) hours as needed.    [provider]    Family History Family History  Problem Relation Age of Onset  . Cancer Mother        breast  . Diabetes Mother   . Alcohol abuse Father   . CVA Maternal Aunt   . Suicidality Maternal Uncle   . Alcohol abuse Paternal Uncle  Social History Social History   Tobacco Use  . Smoking status: Former Games developer  . Smokeless tobacco: Never Used  Substance Use Topics  . Alcohol use: Yes    Comment: every other day   . Drug use: No     Allergies   Patient has no known allergies.   Review of Systems Review of Systems  Constitutional: Positive for fatigue. Negative for fever.  HENT: Positive for hearing loss and sore throat.   Eyes: Negative.   Respiratory: Positive for cough.   Cardiovascular: Negative.   Gastrointestinal: Negative.   Endocrine: Negative.      Physical Exam Triage Vital Signs ED Triage Vitals  Enc Vitals Group     BP 01/02/19 0831 (!) 147/92      Pulse Rate 01/02/19 0831 71     Resp 01/02/19 0831 16     Temp 01/02/19 0831 98.1 F (36.7 C)     Temp Source 01/02/19 0831 Oral     SpO2 01/02/19 0831 97 %     Weight 01/02/19 0832 235 lb (106.6 kg)     Height 01/02/19 0832 5\' 10"  (1.778 m)     Head Circumference --      Peak Flow --      Pain Score 01/02/19 0832 0     Pain Loc --      Pain Edu? --      Excl. in GC? --    No data found.  Updated Vital Signs BP (!) 147/92 (BP Location: Right Arm)   Pulse 71   Temp 98.1 F (36.7 C) (Oral)   Resp 16   Ht 5\' 10"  (1.778 m)   Wt 106.6 kg   SpO2 97%   BMI 33.72 kg/m   Visual Acuity Right Eye Distance:   Left Eye Distance:   Bilateral Distance:    Right Eye Near:   Left Eye Near:    Bilateral Near:     Physical Exam Constitutional:      Appearance: He is well-developed.  HENT:     Ears:     Comments: Both TMs are obstructed by wax.    Nose: No congestion.     Mouth/Throat:     Tonsils: No tonsillar exudate or tonsillar abscesses.     Comments: There is significant redness of the posterior pharynx there is a healed tracheostomy site. Eyes:     Conjunctiva/sclera: Conjunctivae normal.  Cardiovascular:     Rate and Rhythm: Normal rate and regular rhythm.  Pulmonary:     Effort: Pulmonary effort is normal.     Breath sounds: Normal breath sounds.  Abdominal:     Palpations: Abdomen is soft.  Skin:    General: Skin is warm and dry.  Neurological:     Mental Status: He is alert.      UC Treatments / Results  Labs (all labs ordered are listed, but only abnormal results are displayed) Labs Reviewed  STREP A DNA PROBE  POCT RAPID STREP A (OFFICE)  POCT INFLUENZA A/B    EKG None  Radiology No results found.  Procedures Procedures (including critical care time)  Medications Ordered in UC Medications - No data to display  Initial Impression / Assessment and Plan / UC Course  I have reviewed the triage vital signs and the nursing notes.  Pertinent  labs & imaging results that were available during my care of the patient were reviewed by me and considered in my medical decision making (see chart for details).  Strep test was negative, flu test negative.  Due to his history of previous peritonsillar abscess will cover with Augmentin twice a day for 10 days.  He was instructed regarding symptoms to watch for.  Culture will be sent for strep.       Final Clinical Impressions(s) / UC Diagnoses   Final diagnoses:  Acute pharyngitis, unspecified etiology     Discharge Instructions     Take antibiotics as instructed. If you develop worsening symptoms please go to be seen in the emergency room or see Dr. Lazarus Salines.     ED Prescriptions    Medication Sig Dispense Auth. Provider   amoxicillin-clavulanate (AUGMENTIN) 875-125 MG tablet Take 1 tablet by mouth every 12 (twelve) hours. 20 tablet Collene Gobble, MD     Controlled Substance Prescriptions [place] Controlled Substance Registry consulted? Not Applicable   Collene Gobble, MD 01/02/19 1256

## 2019-01-02 NOTE — Discharge Instructions (Signed)
Take antibiotics as instructed. If you develop worsening symptoms please go to be seen in the emergency room or see Dr. Lazarus Salines.

## 2019-01-03 LAB — STREP A DNA PROBE: Group A Strep Probe: NOT DETECTED

## 2019-01-04 ENCOUNTER — Telehealth: Payer: Self-pay

## 2019-01-04 NOTE — Telephone Encounter (Signed)
Pt given neg strep results. Says he still feels about the same. Advised viruses can last up to 7-10 days. F/u with primary if not improved after that.

## 2019-01-22 ENCOUNTER — Telehealth: Payer: Self-pay | Admitting: Psychiatry

## 2019-01-22 ENCOUNTER — Ambulatory Visit: Payer: 59 | Admitting: Psychiatry

## 2019-01-22 DIAGNOSIS — F900 Attention-deficit hyperactivity disorder, predominantly inattentive type: Secondary | ICD-10-CM

## 2019-01-22 MED ORDER — METHYLPHENIDATE HCL ER (OSM) 36 MG PO TBCR: 36.0000 mg | EXTENDED_RELEASE_TABLET | Freq: Every day | ORAL | 0 refills | Status: DC

## 2019-01-22 NOTE — Telephone Encounter (Signed)
Apt re-scheduled due to inclement weather. Pt requesting 4 day supply of Concerta be sent to pharmacy until he can be seen next week.

## 2019-01-27 ENCOUNTER — Ambulatory Visit (INDEPENDENT_AMBULATORY_CARE_PROVIDER_SITE_OTHER): Payer: 59 | Admitting: Psychiatry

## 2019-01-27 VITALS — BP 102/60 | HR 119

## 2019-01-27 DIAGNOSIS — F39 Unspecified mood [affective] disorder: Secondary | ICD-10-CM

## 2019-01-27 DIAGNOSIS — F419 Anxiety disorder, unspecified: Secondary | ICD-10-CM

## 2019-01-27 DIAGNOSIS — F900 Attention-deficit hyperactivity disorder, predominantly inattentive type: Secondary | ICD-10-CM | POA: Diagnosis not present

## 2019-01-27 DIAGNOSIS — G2581 Restless legs syndrome: Secondary | ICD-10-CM

## 2019-01-27 MED ORDER — METHYLPHENIDATE HCL ER 45 MG PO CP24: 45.0000 mg | ORAL_CAPSULE | Freq: Every morning | ORAL | 0 refills | Status: DC

## 2019-01-27 MED ORDER — ESCITALOPRAM OXALATE 20 MG PO TABS: 20.0000 mg | ORAL_TABLET | Freq: Every day | ORAL | 1 refills | Status: DC

## 2019-01-27 MED ORDER — PRAMIPEXOLE DIHYDROCHLORIDE 0.5 MG PO TABS: 0.7250 mg | ORAL_TABLET | Freq: Every evening | ORAL | 0 refills | Status: DC

## 2019-01-27 NOTE — Progress Notes (Signed)
Timothy Camacho [id number] September 17, 1961 58 y.o.  Subjective:   Patient ID:  Timothy Camacho is a 58 y.o. (DOB May 22, 1961) male.  Chief Complaint:  Chief Complaint  Patient presents with  . ADD    HPI Timothy Camacho presents to the office today for follow-up of ADD, Depression, and anxiety. He reports that initially he noticed more response with Concerta the first 2 weeks. He reports that he takes it at 6 am and that initially it was lasting throughout the day and now response wears off early afternoon. He reports improved concentration and ability to focus. He reports that he is less easily distracted now. Reports that he is better able to prioritize tasks at the beginning of the day and by the afternoon is "shuffling papers more." He reports that he is not forgetting familiar procedures as often. Denies any worsening anxiety- "I think it has been a little bit better." Reports irritability may have decreased some. Reports improved mood. Reports that he is no longer going to bed unusually early. Estimates sleeping 8 hours a night. Denies any decrease in appetite. Energy is "better but still not where it needs to be." He reports that he would like to start exercising to improve energy. Motivation is improving. Denies SI.   Past Medication Trials: Lexapro Celexa Wellbutrin XL Olanzapine Risperdal Trileptal Lamictal Gabapentin Topamax Naltrexone Klonopin NAC  Review of Systems:  Review of Systems  HENT: Positive for congestion.        Recent URI and has had residual cough.   Respiratory: Positive for cough.   Cardiovascular: Negative for palpitations.  Musculoskeletal: Negative for gait problem.  Neurological: Negative for tremors.  Psychiatric/Behavioral:       Please refer to HPI    Medications: I have reviewed the patient's current medications.  Current Outpatient Medications  Medication Sig Dispense Refill  . acetaminophen (TYLENOL) 500 MG tablet Take 500 mg by mouth every  6 (six) hours as needed (pain).    Marland Kitchen allopurinol (ZYLOPRIM) 300 MG tablet Take 300 mg by mouth daily.    Marland Kitchen amoxicillin-clavulanate (AUGMENTIN) 875-125 MG tablet Take 1 tablet by mouth every 12 (twelve) hours. 20 tablet 0  . Cholecalciferol (VITAMIN D) 2000 units CAPS Take by mouth.    . Cyanocobalamin (VITAMIN B-12) 1000 MCG/15ML LIQD Take by mouth.    . escitalopram (LEXAPRO) 20 MG tablet Take 1 tablet (20 mg total) by mouth daily. 30 tablet 5  . ibuprofen (ADVIL,MOTRIN) 200 MG tablet Take 800 mg by mouth every 6 (six) hours as needed.    . methylphenidate (CONCERTA) 36 MG PO CR tablet Take 1 tablet (36 mg total) by mouth daily for 4 days. 4 tablet 0  . Multiple Vitamin (MULTIVITAMIN WITH MINERALS) TABS tablet Take 1 tablet by mouth daily.    . pramipexole (MIRAPEX) 0.5 MG tablet Take 1.5 tablets (0.75 mg total) by mouth every evening. 135 tablet 1  . Pyridoxine HCl (VITAMIN B-6 PO) Take by mouth.    . THEANINE PO Take by mouth.     No current facility-administered medications for this visit.     Medication Side Effects: None  Allergies: No Known Allergies  Past Medical History:  Diagnosis Date  . Gout   . History of peritonsillar abscess   . RLS (restless legs syndrome)     Family History  Problem Relation Age of Onset  . Cancer Mother        breast  . Diabetes Mother   . Alcohol abuse  Father   . CVA Maternal Aunt   . Suicidality Maternal Uncle   . Alcohol abuse Paternal Uncle     Social History   Socioeconomic History  . Marital status: Married    Spouse name: Not on file  . Number of children: Not on file  . Years of education: Not on file  . Highest education level: Not on file  Occupational History  . Not on file  Social Needs  . Financial resource strain: Not on file  . Food insecurity:    Worry: Not on file    Inability: Not on file  . Transportation needs:    Medical: Not on file    Non-medical: Not on file  Tobacco Use  . Smoking status: Former  Games developer  . Smokeless tobacco: Never Used  Substance and Sexual Activity  . Alcohol use: Yes    Comment: every other day   . Drug use: No  . Sexual activity: Not on file  Lifestyle  . Physical activity:    Days per week: Not on file    Minutes per session: Not on file  . Stress: Not on file  Relationships  . Social connections:    Talks on phone: Not on file    Gets together: Not on file    Attends religious service: Not on file    Active member of club or organization: Not on file    Attends meetings of clubs or organizations: Not on file    Relationship status: Not on file  . Intimate partner violence:    Fear of current or ex partner: Not on file    Emotionally abused: Not on file    Physically abused: Not on file    Forced sexual activity: Not on file  Other Topics Concern  . Not on file  Social History Narrative  . Not on file    Past Medical History, Surgical history, Social history, and Family history were reviewed and updated as appropriate.   Please see review of systems for further details on the patient's review from today.   Objective:   Physical Exam:  There were no vitals taken for this visit.  Physical Exam  Lab Review:     Component Value Date/Time   CREATININE 1.09 12/06/2013 0119   GFRNONAA 77 (L) 12/06/2013 0119   GFRAA 89 (L) 12/06/2013 0119       Component Value Date/Time   WBC 12.2 (H) 12/07/2013 0348   RBC 3.61 (L) 12/07/2013 0348   HGB 11.5 (L) 12/07/2013 0348   HCT 34.7 (L) 12/07/2013 0348   PLT 198 12/07/2013 0348   MCV 96.1 12/07/2013 0348   MCH 31.9 12/07/2013 0348   MCHC 33.1 12/07/2013 0348   RDW 14.0 12/07/2013 0348   LYMPHSABS 2.2 12/07/2013 0348   MONOABS 1.4 (H) 12/07/2013 0348   EOSABS 0.2 12/07/2013 0348   BASOSABS 0.0 12/07/2013 0348    No results found for: POCLITH, LITHIUM   No results found for: PHENYTOIN, PHENOBARB, VALPROATE, CBMZ   .res Assessment: Plan:    No diagnosis found.  Please see After Visit  Summary for patient specific instructions.  No future appointments.  No orders of the defined types were placed in this encounter.     -------------------------------

## 2019-02-01 ENCOUNTER — Encounter: Payer: Self-pay | Admitting: Psychiatry

## 2019-02-25 ENCOUNTER — Encounter: Payer: Self-pay | Admitting: Psychiatry

## 2019-02-25 ENCOUNTER — Other Ambulatory Visit: Payer: Self-pay

## 2019-02-25 ENCOUNTER — Ambulatory Visit (INDEPENDENT_AMBULATORY_CARE_PROVIDER_SITE_OTHER): Payer: 59 | Admitting: Psychiatry

## 2019-02-25 VITALS — BP 111/69 | HR 74

## 2019-02-25 DIAGNOSIS — F39 Unspecified mood [affective] disorder: Secondary | ICD-10-CM | POA: Diagnosis not present

## 2019-02-25 DIAGNOSIS — F900 Attention-deficit hyperactivity disorder, predominantly inattentive type: Secondary | ICD-10-CM | POA: Diagnosis not present

## 2019-02-25 DIAGNOSIS — F419 Anxiety disorder, unspecified: Secondary | ICD-10-CM | POA: Diagnosis not present

## 2019-02-25 DIAGNOSIS — G2581 Restless legs syndrome: Secondary | ICD-10-CM

## 2019-02-25 MED ORDER — PRAMIPEXOLE DIHYDROCHLORIDE 0.5 MG PO TABS: 0.7250 mg | ORAL_TABLET | Freq: Every evening | ORAL | 0 refills | Status: DC

## 2019-02-25 MED ORDER — METHYLPHENIDATE HCL ER (OSM) 36 MG PO TBCR: 36.0000 mg | EXTENDED_RELEASE_TABLET | Freq: Every day | ORAL | 0 refills | Status: DC

## 2019-02-25 MED ORDER — METHYLPHENIDATE HCL 10 MG PO TABS: ORAL_TABLET | ORAL | 0 refills | Status: DC

## 2019-02-25 NOTE — Progress Notes (Signed)
Timothy Camacho [id number] Feb 28, 1961 58 y.o.  Subjective:   Patient ID:  Timothy Camacho is a 58 y.o. (DOB Dec 19, 1960) male.  Chief Complaint:  Chief Complaint  Patient presents with  . ADD  . Follow-up    h/o Anxiety and Depression    HPI Timothy Camacho presents to the office today for follow-up of ADD. He reports that he has had tolerability issues with Marnee Spring to include some intrusive thoughts and gritting his teeth, particularly later in the day. Reports that he has not had intrusive thoughts in the past. Has noticed some increase in cravings but has not acted on these cravings. Reports that Marnee Spring has been effective for insomnia. He reports that he has also been awakening in the night with sweats. He reports that it has taken longer to fall asleep and that it has resulted in decreased sleep quantity. He reports "that frustration anxiety has subsided because I can think through it." Mood has been ok. He reports irritability at baseline. Reports that his energy and motivation has improved overall. Appetite is stable and "probably too good at times." Denies SI.   Past Medication Trials: Lexapro Celexa Wellbutrin XL Olanzapine Risperdal Trileptal Lamictal Gabapentin Topamax Naltrexone Klonopin NAC  Review of Systems:  Review of Systems  HENT: Positive for congestion and sore throat.   Musculoskeletal: Negative for gait problem.  Neurological: Negative for tremors.  Psychiatric/Behavioral:       Please refer to HPI    Medications: I have reviewed the patient's current medications.  Current Outpatient Medications  Medication Sig Dispense Refill  . acetaminophen (TYLENOL) 500 MG tablet Take 500 mg by mouth every 6 (six) hours as needed (pain).    Marland Kitchen allopurinol (ZYLOPRIM) 300 MG tablet Take 300 mg by mouth daily.    . Cholecalciferol (VITAMIN D) 2000 units CAPS Take by mouth.    . escitalopram (LEXAPRO) 20 MG tablet Take 1 tablet (20 mg total) by mouth daily. 90  tablet 1  . ibuprofen (ADVIL,MOTRIN) 200 MG tablet Take 800 mg by mouth every 6 (six) hours as needed.    . Multiple Vitamin (MULTIVITAMIN WITH MINERALS) TABS tablet Take 1 tablet by mouth daily.    . pramipexole (MIRAPEX) 0.5 MG tablet Take 1.5 tablets (0.75 mg total) by mouth every evening. 135 tablet 0  . Pseudoeph-CPM-DM-APAP (TYLENOL COLD PO) Take by mouth.    . Pyridoxine HCl (VITAMIN B-6 PO) Take by mouth.    . THEANINE PO Take by mouth.    . Cyanocobalamin (VITAMIN B-12) 1000 MCG/15ML LIQD Take by mouth.    . methylphenidate (CONCERTA) 36 MG PO CR tablet Take 1 tablet (36 mg total) by mouth daily for 30 days. 30 tablet 0  . methylphenidate (RITALIN) 10 MG tablet Take 1/2-1 tab po q afternoon 30 tablet 0   No current facility-administered medications for this visit.     Medication Side Effects: Other: Sleep disturbance, sweating, intrusive thoughts  Allergies: No Known Allergies  Past Medical History:  Diagnosis Date  . Gout   . History of peritonsillar abscess   . RLS (restless legs syndrome)     Family History  Problem Relation Age of Onset  . Cancer Mother        breast  . Diabetes Mother   . Alcohol abuse Father   . CVA Maternal Aunt   . Suicidality Maternal Uncle   . Alcohol abuse Paternal Uncle     Social History   Socioeconomic History  . Marital status:  Married    Spouse name: Not on file  . Number of children: Not on file  . Years of education: Not on file  . Highest education level: Not on file  Occupational History  . Not on file  Social Needs  . Financial resource strain: Not on file  . Food insecurity:    Worry: Not on file    Inability: Not on file  . Transportation needs:    Medical: Not on file    Non-medical: Not on file  Tobacco Use  . Smoking status: Former Games developer  . Smokeless tobacco: Never Used  Substance and Sexual Activity  . Alcohol use: Yes    Comment: every other day   . Drug use: No  . Sexual activity: Not on file   Lifestyle  . Physical activity:    Days per week: Not on file    Minutes per session: Not on file  . Stress: Not on file  Relationships  . Social connections:    Talks on phone: Not on file    Gets together: Not on file    Attends religious service: Not on file    Active member of club or organization: Not on file    Attends meetings of clubs or organizations: Not on file    Relationship status: Not on file  . Intimate partner violence:    Fear of current or ex partner: Not on file    Emotionally abused: Not on file    Physically abused: Not on file    Forced sexual activity: Not on file  Other Topics Concern  . Not on file  Social History Narrative  . Not on file    Past Medical History, Surgical history, Social history, and Family history were reviewed and updated as appropriate.   Please see review of systems for further details on the patient's review from today.   Objective:   Physical Exam:  BP 111/69   Pulse 74   Physical Exam Constitutional:      General: He is not in acute distress.    Appearance: He is well-developed.  Musculoskeletal:        General: No deformity.  Neurological:     Mental Status: He is alert and oriented to person, place, and time.     Coordination: Coordination normal.  Psychiatric:        Attention and Perception: Attention and perception normal. He does not perceive auditory or visual hallucinations.        Mood and Affect: Mood normal. Mood is not anxious or depressed. Affect is not labile, blunt, angry or inappropriate.        Speech: Speech normal.        Behavior: Behavior normal.        Thought Content: Thought content normal. Thought content does not include homicidal or suicidal ideation. Thought content does not include homicidal or suicidal plan.        Cognition and Memory: Cognition and memory normal.        Judgment: Judgment normal.     Comments: Insight intact. No delusions.      Lab Review:     Component Value  Date/Time   CREATININE 1.09 12/06/2013 0119   GFRNONAA 77 (L) 12/06/2013 0119   GFRAA 89 (L) 12/06/2013 0119       Component Value Date/Time   WBC 12.2 (H) 12/07/2013 0348   RBC 3.61 (L) 12/07/2013 0348   HGB 11.5 (L) 12/07/2013 0348   HCT 34.7 (  L) 12/07/2013 0348   PLT 198 12/07/2013 0348   MCV 96.1 12/07/2013 0348   MCH 31.9 12/07/2013 0348   MCHC 33.1 12/07/2013 0348   RDW 14.0 12/07/2013 0348   LYMPHSABS 2.2 12/07/2013 0348   MONOABS 1.4 (H) 12/07/2013 0348   EOSABS 0.2 12/07/2013 0348   BASOSABS 0.0 12/07/2013 0348    No results found for: POCLITH, LITHIUM   No results found for: PHENYTOIN, PHENOBARB, VALPROATE, CBMZ   .res Assessment: Plan:   Discussed treatment options with patient and potential benefits, risks, and side effects.  Agreed to resume Concerta 36 mg p.o. every morning since this was well-tolerated and worked well from morning until early afternoon.  Will add immediate release methylphenidate to be used in the afternoon once Concerta wears off since immediate release will likely not interfere with sleep. Continue Lexapro 20 mg daily for mood and anxiety. Attention deficit hyperactivity disorder (ADHD), predominantly inattentive type - Plan: methylphenidate (CONCERTA) 36 MG PO CR tablet, methylphenidate (RITALIN) 10 MG tablet  Anxiety disorder, unspecified type  Mild mood disorder (HCC)  RLS (restless legs syndrome) - Plan: pramipexole (MIRAPEX) 0.5 MG tablet  Please see After Visit Summary for patient specific instructions.  Future Appointments  Date Time Provider Department Center  03/25/2019  8:00 AM Corie Chiquito, PMHNP [facility] None    No orders of the defined types were placed in this encounter.     -------------------------------

## 2019-03-23 ENCOUNTER — Telehealth: Payer: Self-pay | Admitting: Psychiatry

## 2019-03-23 ENCOUNTER — Other Ambulatory Visit: Payer: Self-pay

## 2019-03-23 DIAGNOSIS — F900 Attention-deficit hyperactivity disorder, predominantly inattentive type: Secondary | ICD-10-CM

## 2019-03-23 MED ORDER — METHYLPHENIDATE HCL ER (OSM) 36 MG PO TBCR: 36.0000 mg | EXTENDED_RELEASE_TABLET | Freq: Every day | ORAL | 0 refills | Status: DC

## 2019-03-23 NOTE — Telephone Encounter (Signed)
Rx sent to [facility] and can be filled on 4/8

## 2019-03-23 NOTE — Telephone Encounter (Signed)
We had to reschedule his appt for 4/8.  His appt is now 04/17/19.  He needs refill of his concerta.  Send to [facility] [address]

## 2019-03-23 NOTE — Telephone Encounter (Signed)
Submitted to provider for approval 

## 2019-03-25 ENCOUNTER — Ambulatory Visit: Payer: 59 | Admitting: Psychiatry

## 2019-04-17 ENCOUNTER — Ambulatory Visit (INDEPENDENT_AMBULATORY_CARE_PROVIDER_SITE_OTHER): Payer: 59 | Admitting: Psychiatry

## 2019-04-17 ENCOUNTER — Encounter: Payer: Self-pay | Admitting: Psychiatry

## 2019-04-17 ENCOUNTER — Other Ambulatory Visit: Payer: Self-pay

## 2019-04-17 DIAGNOSIS — F419 Anxiety disorder, unspecified: Secondary | ICD-10-CM

## 2019-04-17 DIAGNOSIS — F39 Unspecified mood [affective] disorder: Secondary | ICD-10-CM | POA: Diagnosis not present

## 2019-04-17 DIAGNOSIS — F900 Attention-deficit hyperactivity disorder, predominantly inattentive type: Secondary | ICD-10-CM

## 2019-04-17 MED ORDER — METHYLPHENIDATE HCL ER (OSM) 36 MG PO TBCR: 36.0000 mg | EXTENDED_RELEASE_TABLET | Freq: Every day | ORAL | 0 refills | Status: DC

## 2019-04-17 MED ORDER — METHYLPHENIDATE HCL 10 MG PO TABS: ORAL_TABLET | ORAL | 0 refills | Status: DC

## 2019-04-17 MED ORDER — METHYLPHENIDATE HCL 10 MG PO TABS: 10.0000 mg | ORAL_TABLET | Freq: Every day | ORAL | 0 refills | Status: DC | PRN

## 2019-04-17 NOTE — Progress Notes (Signed)
Timothy Camacho [id number] [birth year] 58 y.o.  Virtual Visit via Telephone Note  I connected with@ on 04/17/19 at  8:00 AM EDT by telephone and verified that I am speaking with the correct person using two identifiers.   I discussed the limitations, risks, security and privacy concerns of performing an evaluation and management service by telephone and the availability of in person appointments. I also discussed with the patient that there may be a patient responsible charge related to this service. The patient expressed understanding and agreed to proceed.   I discussed the assessment and treatment plan with the patient. The patient was provided an opportunity to ask questions and all were answered. The patient agreed with the plan and demonstrated an understanding of the instructions.   The patient was advised to call back or seek an in-person evaluation if the symptoms worsen or if the condition fails to improve as anticipated.  I provided 30 minutes of non-face-to-face time during this encounter.  The patient was located at home.  The provider was located at home.   Corie Chiquito, PMHNP   Subjective:   Patient ID:  Timothy Camacho is a 58 y.o. (DOB 1961/10/14) male.  Chief Complaint:  Chief Complaint  Patient presents with  . ADD    f/u for h/o depression and anxiety    HPI Timothy Camacho presents for follow-up of ADD and h/o Anxiety and depression. "Everything seems to be leveled off." He reports improved concentration and that his focus is adequate. He reports that his mood and anxiety have been stable. He reports that he has only had stress in response to major triggers. He reports that he is sleeping without difficulty. Denies any change in appetite. He reports that his energy has been fair. Motivation is good- "I want to stay busy."  Denies SI.  Reports that he is taking Methylphenidate IR about 40% of the time and finds it effective. Reports that he notices when he takes  it that his thoughts have been clearer in the evening and he has been more productive.   Review of Systems:  Review of Systems  Constitutional: Positive for fever.       Reports BP was 118/76 and pulse was 76.  HENT: Positive for postnasal drip.   Respiratory: Positive for cough.   Cardiovascular: Negative for palpitations.  Musculoskeletal: Negative for gait problem.  Allergic/Immunologic: Positive for environmental allergies.  Neurological: Negative for tremors.  Psychiatric/Behavioral:       Please refer to HPI   Reports PCP prescribed inhaler for cough. Reports that he had low grade fever. Reports that employer gave him paid leave through Monday.  Medications: I have reviewed the patient's current medications.  Current Outpatient Medications  Medication Sig Dispense Refill  . acetaminophen (TYLENOL) 500 MG tablet Take 500 mg by mouth every 6 (six) hours as needed (pain).    Marland Kitchen allopurinol (ZYLOPRIM) 300 MG tablet Take 300 mg by mouth daily.    . Cholecalciferol (VITAMIN D) 2000 units CAPS Take by mouth.    . Cyanocobalamin (VITAMIN B-12) 1000 MCG/15ML LIQD Take by mouth.    . escitalopram (LEXAPRO) 20 MG tablet Take 1 tablet (20 mg total) by mouth daily. 90 tablet 1  . ibuprofen (ADVIL,MOTRIN) 200 MG tablet Take 800 mg by mouth every 6 (six) hours as needed.    Melene Muller ON 04/24/2019] methylphenidate (CONCERTA) 36 MG PO CR tablet Take 1 tablet (36 mg total) by mouth daily for 30 days. 30 tablet  0  . methylphenidate (RITALIN) 10 MG tablet Take 1/2-1 tab po q afternoon 30 tablet 0  . pramipexole (MIRAPEX) 0.5 MG tablet Take 1.5 tablets (0.75 mg total) by mouth every evening. 135 tablet 0  . Pyridoxine HCl (VITAMIN B-6 PO) Take by mouth.    . THEANINE PO Take by mouth.    . umeclidinium-vilanterol (ANORO ELLIPTA) 62.5-25 MCG/INH AEPB Inhale 1 puff into the lungs daily.    Melene Muller. [START ON 05/22/2019] methylphenidate (CONCERTA) 36 MG PO CR tablet Take 1 tablet (36 mg total) by mouth daily for  30 days. 30 tablet 0  . [START ON 05/15/2019] methylphenidate (RITALIN) 10 MG tablet Take 1 tablet (10 mg total) by mouth daily as needed for up to 30 days. 30 tablet 0  . Multiple Vitamin (MULTIVITAMIN WITH MINERALS) TABS tablet Take 1 tablet by mouth daily.     No current facility-administered medications for this visit.     Medication Side Effects: Other: Some jaw clinching.  Allergies: No Known Allergies  Past Medical History:  Diagnosis Date  . Gout   . History of peritonsillar abscess   . RLS (restless legs syndrome)     Family History  Problem Relation Age of Onset  . Cancer Mother        breast  . Diabetes Mother   . Alcohol abuse Father   . CVA Maternal Aunt   . Suicidality Maternal Uncle   . Alcohol abuse Paternal Uncle     Social History   Socioeconomic History  . Marital status: Married    Spouse name: Not on file  . Number of children: Not on file  . Years of education: Not on file  . Highest education level: Not on file  Occupational History  . Not on file  Social Needs  . Financial resource strain: Not on file  . Food insecurity:    Worry: Not on file    Inability: Not on file  . Transportation needs:    Medical: Not on file    Non-medical: Not on file  Tobacco Use  . Smoking status: Former Games developermoker  . Smokeless tobacco: Never Used  Substance and Sexual Activity  . Alcohol use: Yes    Comment: every other day   . Drug use: No  . Sexual activity: Not on file  Lifestyle  . Physical activity:    Days per week: Not on file    Minutes per session: Not on file  . Stress: Not on file  Relationships  . Social connections:    Talks on phone: Not on file    Gets together: Not on file    Attends religious service: Not on file    Active member of club or organization: Not on file    Attends meetings of clubs or organizations: Not on file    Relationship status: Not on file  . Intimate partner violence:    Fear of current or ex partner: Not on file     Emotionally abused: Not on file    Physically abused: Not on file    Forced sexual activity: Not on file  Other Topics Concern  . Not on file  Social History Narrative  . Not on file    Past Medical History, Surgical history, Social history, and Family history were reviewed and updated as appropriate.   Please see review of systems for further details on the patient's review from today.   Objective:   Physical Exam:  There were no vitals  taken for this visit.  Physical Exam Neurological:     Mental Status: He is alert and oriented to person, place, and time.     Cranial Nerves: No dysarthria.  Psychiatric:        Attention and Perception: Attention normal.        Mood and Affect: Mood normal.        Speech: Speech normal.        Behavior: Behavior is cooperative.        Thought Content: Thought content normal. Thought content is not paranoid or delusional. Thought content does not include homicidal or suicidal ideation. Thought content does not include homicidal or suicidal plan.        Cognition and Memory: Cognition and memory normal.        Judgment: Judgment normal.     Lab Review:     Component Value Date/Time   CREATININE 1.09 12/06/2013 0119   GFRNONAA 77 (L) 12/06/2013 0119   GFRAA 89 (L) 12/06/2013 0119       Component Value Date/Time   WBC 12.2 (H) 12/07/2013 0348   RBC 3.61 (L) 12/07/2013 0348   HGB 11.5 (L) 12/07/2013 0348   HCT 34.7 (L) 12/07/2013 0348   PLT 198 12/07/2013 0348   MCV 96.1 12/07/2013 0348   MCH 31.9 12/07/2013 0348   MCHC 33.1 12/07/2013 0348   RDW 14.0 12/07/2013 0348   LYMPHSABS 2.2 12/07/2013 0348   MONOABS 1.4 (H) 12/07/2013 0348   EOSABS 0.2 12/07/2013 0348   BASOSABS 0.0 12/07/2013 0348    No results found for: POCLITH, LITHIUM   No results found for: PHENYTOIN, PHENOBARB, VALPROATE, CBMZ   .res Assessment: Plan:   Will continue Concerta 36 mg p.o. every morning for attention deficit. Continue methylphenidate 10  mg daily as needed for attention deficit. Continue Lexapro 20 mg daily for depression and anxiety.  Patient to follow-up in 2 months or sooner if clinically indicated. Patient advised to contact office with any questions, adverse effects, or acute worsening in signs and symptoms.  Attention deficit hyperactivity disorder (ADHD), predominantly inattentive type - Plan: methylphenidate (CONCERTA) 36 MG PO CR tablet, methylphenidate (RITALIN) 10 MG tablet, methylphenidate (CONCERTA) 36 MG PO CR tablet, methylphenidate (RITALIN) 10 MG tablet  Anxiety disorder, unspecified type  Mild mood disorder (HCC)  Please see After Visit Summary for patient specific instructions.  Future Appointments  Date Time Provider Department Center  06/17/2019  8:00 AM Corie Chiquito, PMHNP [facility] None    No orders of the defined types were placed in this encounter.     -------------------------------

## 2019-04-26 ENCOUNTER — Other Ambulatory Visit: Payer: Self-pay | Admitting: Psychiatry

## 2019-04-26 DIAGNOSIS — F39 Unspecified mood [affective] disorder: Secondary | ICD-10-CM

## 2019-06-17 ENCOUNTER — Other Ambulatory Visit: Payer: Self-pay

## 2019-06-17 ENCOUNTER — Ambulatory Visit (INDEPENDENT_AMBULATORY_CARE_PROVIDER_SITE_OTHER): Payer: 59 | Admitting: Psychiatry

## 2019-06-17 ENCOUNTER — Encounter: Payer: Self-pay | Admitting: Psychiatry

## 2019-06-17 DIAGNOSIS — F900 Attention-deficit hyperactivity disorder, predominantly inattentive type: Secondary | ICD-10-CM

## 2019-06-17 DIAGNOSIS — G2581 Restless legs syndrome: Secondary | ICD-10-CM

## 2019-06-17 DIAGNOSIS — F39 Unspecified mood [affective] disorder: Secondary | ICD-10-CM | POA: Diagnosis not present

## 2019-06-17 MED ORDER — PRAMIPEXOLE DIHYDROCHLORIDE 0.5 MG PO TABS: 0.7250 mg | ORAL_TABLET | Freq: Every evening | ORAL | 1 refills | Status: DC

## 2019-06-17 MED ORDER — ESCITALOPRAM OXALATE 20 MG PO TABS: 20.0000 mg | ORAL_TABLET | Freq: Every day | ORAL | 1 refills | Status: DC

## 2019-06-17 MED ORDER — METHYLPHENIDATE HCL 10 MG PO TABS: 10.0000 mg | ORAL_TABLET | Freq: Every day | ORAL | 0 refills | Status: DC | PRN

## 2019-06-17 MED ORDER — METHYLPHENIDATE HCL 10 MG PO TABS: ORAL_TABLET | ORAL | 0 refills | Status: DC

## 2019-06-17 MED ORDER — METHYLPHENIDATE HCL ER (OSM) 36 MG PO TBCR: 36.0000 mg | EXTENDED_RELEASE_TABLET | Freq: Every day | ORAL | 0 refills | Status: DC

## 2019-06-17 NOTE — Progress Notes (Signed)
Timothy Camacho [id number] 1961-06-08 58 y.o.  Subjective:   Patient ID:  Timothy Camacho is a 58 y.o. (DOB 04/14/1961) male.  Chief Complaint:  Chief Complaint  Patient presents with  . ADD  . Anxiety  . Depression    HPI Timothy Camacho presents to the office today for follow-up of anxiety, depression, and ADD.  He reports that he left his job last week due to stressful work conditions. Reports that he has also been experiencing anger in response to current events. He reports some anxiety in response to situational stressors. He reports that mood has been lower due to stressors. Denies significant depression. He reports that he has been going to bed earlier and will "toss and turn." He reports adequate sleep. Appetite is stable. He reports energy and motivation are adequate when he has something he needs to accomplish. Concentration has been adequate. He reports that he has been grinding his teeth and does not recall doing this in the past. He reports that Methylphenidate prn is helpful when he has something he has to accomplish later in the day. Denies SI.  Questions if leaving his job was impulsive. Reports that he had been dissatisfied with work conditions for some time.   Past Medication Trials: Lexapro Celexa Wellbutrin XL Olanzapine Risperdal Trileptal Lamictal Gabapentin Topamax Naltrexone Klonopin NAC Concerta Adhansia- intrusive thoughts, grinding teeth methylphenidate  Review of Systems:  Review of Systems  Cardiovascular: Negative for palpitations.  Musculoskeletal: Negative for gait problem.       Recent leg cramps  Neurological: Negative for tremors.       Reports some worsening in RLS recently.  Psychiatric/Behavioral:       Please refer to HPI    Medications: I have reviewed the patient's current medications.  Current Outpatient Medications  Medication Sig Dispense Refill  . acetaminophen (TYLENOL) 500 MG tablet Take 500 mg by mouth every 6 (six)  hours as needed (pain).    Marland Kitchen allopurinol (ZYLOPRIM) 300 MG tablet Take 300 mg by mouth daily.    . Cholecalciferol (VITAMIN D) 2000 units CAPS Take by mouth.    . Cyanocobalamin (VITAMIN B-12) 1000 MCG/15ML LIQD Take by mouth.    . escitalopram (LEXAPRO) 20 MG tablet Take 1 tablet (20 mg total) by mouth daily. 90 tablet 1  . ibuprofen (ADVIL,MOTRIN) 200 MG tablet Take 800 mg by mouth every 6 (six) hours as needed.    Derrill Memo ON 07/22/2019] methylphenidate (CONCERTA) 36 MG PO CR tablet Take 1 tablet (36 mg total) by mouth daily. 30 tablet 0  . [START ON 06/24/2019] methylphenidate (CONCERTA) 36 MG PO CR tablet Take 1 tablet (36 mg total) by mouth daily. 30 tablet 0  . [START ON 07/22/2019] methylphenidate (RITALIN) 10 MG tablet Take 1/2-1 tab po q afternoon 30 tablet 0  . [START ON 06/24/2019] methylphenidate (RITALIN) 10 MG tablet Take 1 tablet (10 mg total) by mouth daily as needed. 30 tablet 0  . Multiple Vitamin (MULTIVITAMIN WITH MINERALS) TABS tablet Take 1 tablet by mouth daily.    . pramipexole (MIRAPEX) 0.5 MG tablet Take 1.5 tablets (0.75 mg total) by mouth every evening. 135 tablet 1  . Pyridoxine HCl (VITAMIN B-6 PO) Take by mouth.    . THEANINE PO Take by mouth.    . umeclidinium-vilanterol (ANORO ELLIPTA) 62.5-25 MCG/INH AEPB Inhale 1 puff into the lungs daily.     No current facility-administered medications for this visit.     Medication Side Effects: Other: Possible  teeth grinding. Vivid dreams  Allergies: No Known Allergies  Past Medical History:  Diagnosis Date  . Gout   . History of peritonsillar abscess   . RLS (restless legs syndrome)     Family History  Problem Relation Age of Onset  . Cancer Mother        breast  . Diabetes Mother   . Alcohol abuse Father   . CVA Maternal Aunt   . Suicidality Maternal Uncle   . Alcohol abuse Paternal Uncle     Social History   Socioeconomic History  . Marital status: Married    Spouse name: Not on file  . Number of  children: Not on file  . Years of education: Not on file  . Highest education level: Not on file  Occupational History  . Not on file  Social Needs  . Financial resource strain: Not on file  . Food insecurity    Worry: Not on file    Inability: Not on file  . Transportation needs    Medical: Not on file    Non-medical: Not on file  Tobacco Use  . Smoking status: Former Games developermoker  . Smokeless tobacco: Never Used  Substance and Sexual Activity  . Alcohol use: Yes    Comment: every other day   . Drug use: No  . Sexual activity: Not on file  Lifestyle  . Physical activity    Days per week: Not on file    Minutes per session: Not on file  . Stress: Not on file  Relationships  . Social Musicianconnections    Talks on phone: Not on file    Gets together: Not on file    Attends religious service: Not on file    Active member of club or organization: Not on file    Attends meetings of clubs or organizations: Not on file    Relationship status: Not on file  . Intimate partner violence    Fear of current or ex partner: Not on file    Emotionally abused: Not on file    Physically abused: Not on file    Forced sexual activity: Not on file  Other Topics Concern  . Not on file  Social History Narrative  . Not on file    Past Medical History, Surgical history, Social history, and Family history were reviewed and updated as appropriate.   Please see review of systems for further details on the patient's review from today.   Objective:   Physical Exam:  BP 133/80   Pulse 84   Physical Exam Constitutional:      General: He is not in acute distress.    Appearance: He is well-developed.  Musculoskeletal:        General: No deformity.  Neurological:     Mental Status: He is alert and oriented to person, place, and time.     Coordination: Coordination normal.  Psychiatric:        Attention and Perception: Attention and perception normal. He does not perceive auditory or visual  hallucinations.        Mood and Affect: Mood is anxious. Mood is not depressed. Affect is not labile, blunt, angry or inappropriate.        Speech: Speech normal.        Behavior: Behavior normal.        Thought Content: Thought content normal. Thought content does not include homicidal or suicidal ideation. Thought content does not include homicidal or suicidal plan.  Cognition and Memory: Cognition and memory normal.        Judgment: Judgment normal.     Comments: Insight intact. No delusions.      Lab Review:     Component Value Date/Time   CREATININE 1.09 12/06/2013 0119   GFRNONAA 77 (L) 12/06/2013 0119   GFRAA 89 (L) 12/06/2013 0119       Component Value Date/Time   WBC 12.2 (H) 12/07/2013 0348   RBC 3.61 (L) 12/07/2013 0348   HGB 11.5 (L) 12/07/2013 0348   HCT 34.7 (L) 12/07/2013 0348   PLT 198 12/07/2013 0348   MCV 96.1 12/07/2013 0348   MCH 31.9 12/07/2013 0348   MCHC 33.1 12/07/2013 0348   RDW 14.0 12/07/2013 0348   LYMPHSABS 2.2 12/07/2013 0348   MONOABS 1.4 (H) 12/07/2013 0348   EOSABS 0.2 12/07/2013 0348   BASOSABS 0.0 12/07/2013 0348    No results found for: POCLITH, LITHIUM   No results found for: PHENYTOIN, PHENOBARB, VALPROATE, CBMZ   .res Assessment: Plan:   Patient reports that he would like to continue current medications without changes, particularly considering that they are currently multiple psychosocial factors that may be affecting mood and anxiety.  Discussed that he may want to consider resuming [facility] since work schedule and commute had previously been primary barriers to continuing therapy.  Discussed that if he is unable to afford Concerta due to lapse in insurance coverage, changing to methylphenidate twice daily may be the best alternative.  Timothy Camacho was seen today for add, anxiety and depression.  Diagnoses and all orders for this visit:  Mild mood disorder (HCC) -     escitalopram (LEXAPRO) 20 MG tablet;  Take 1 tablet (20 mg total) by mouth daily.  Attention deficit hyperactivity disorder (ADHD), predominantly inattentive type -     methylphenidate (CONCERTA) 36 MG PO CR tablet; Take 1 tablet (36 mg total) by mouth daily. -     methylphenidate (CONCERTA) 36 MG PO CR tablet; Take 1 tablet (36 mg total) by mouth daily. -     methylphenidate (RITALIN) 10 MG tablet; Take 1/2-1 tab po q afternoon -     methylphenidate (RITALIN) 10 MG tablet; Take 1 tablet (10 mg total) by mouth daily as needed.  RLS (restless legs syndrome) -     pramipexole (MIRAPEX) 0.5 MG tablet; Take 1.5 tablets (0.75 mg total) by mouth every evening.     Please see After Visit Summary for patient specific instructions.  Future Appointments  Date Time Provider Department Center  08/18/2019  9:00 AM Corie Chiquitoarter, Json Koelzer, PMHNP [facility] None    No orders of the defined types were placed in this encounter.   -------------------------------

## 2019-08-18 ENCOUNTER — Ambulatory Visit (INDEPENDENT_AMBULATORY_CARE_PROVIDER_SITE_OTHER): Payer: 59 | Admitting: Psychiatry

## 2019-08-18 ENCOUNTER — Encounter: Payer: Self-pay | Admitting: Psychiatry

## 2019-08-18 ENCOUNTER — Other Ambulatory Visit: Payer: Self-pay

## 2019-08-18 VITALS — BP 144/95 | HR 78

## 2019-08-18 DIAGNOSIS — G2581 Restless legs syndrome: Secondary | ICD-10-CM | POA: Diagnosis not present

## 2019-08-18 DIAGNOSIS — F419 Anxiety disorder, unspecified: Secondary | ICD-10-CM

## 2019-08-18 DIAGNOSIS — F39 Unspecified mood [affective] disorder: Secondary | ICD-10-CM

## 2019-08-18 DIAGNOSIS — F900 Attention-deficit hyperactivity disorder, predominantly inattentive type: Secondary | ICD-10-CM | POA: Diagnosis not present

## 2019-08-18 MED ORDER — METHYLPHENIDATE HCL 10 MG PO TABS: ORAL_TABLET | ORAL | 0 refills | Status: DC

## 2019-08-18 MED ORDER — METHYLPHENIDATE HCL ER (OSM) 36 MG PO TBCR: 36.0000 mg | EXTENDED_RELEASE_TABLET | Freq: Every day | ORAL | 0 refills | Status: DC

## 2019-08-18 MED ORDER — METHYLPHENIDATE HCL 10 MG PO TABS: 10.0000 mg | ORAL_TABLET | Freq: Every day | ORAL | 0 refills | Status: DC | PRN

## 2019-08-18 MED ORDER — METHYLPHENIDATE HCL 10 MG PO TABS: 10.0000 mg | ORAL_TABLET | Freq: Every day | ORAL | 0 refills | Status: DC

## 2019-08-18 NOTE — Progress Notes (Signed)
Timothy Camacho [id number] 10/08/1961 58 y.o.  Subjective:   Patient ID:  Timothy Camacho is a 58 y.o. (DOB 1961/11/06) male.  Chief Complaint:  Chief Complaint  Patient presents with  . Follow-up    ADD, Anxiety, Depression    HPI Timothy Camacho presents to the office today for follow-up of mood, anxiety, and ADD. He denies any recent significant depression. He reports that his anxiety has improved. He reports that other people have noticed that he is less easily irritated. Reports that he notices his confidence has been improving. He reports that he is now staying up later. He reports that he has RLS and/or leg cramping some nights that interfere with falling asleep. He reports that he is not having as much difficulty getting up in the morning. He reports sleeping at least 6 hours a night. Appetite has been good. Energy has been ok. Motivation has improved. Plans to start exercising again. He reports that immediate release Methylphenidate seems to be more effective than Concerta. He reports that he noticed increased "clarity" and focus when he took Methylphenidate only when he forgot Concerta. He reports response to Methlyphenidate seems to last about 4 hours. Denies SI.  He reports that he has a new job and is enjoying this. Started job 7/27. Works 8-5:30.   Past Medication Trials: Lexapro Celexa Wellbutrin XL Olanzapine Risperdal Trileptal Lamictal Gabapentin Topamax Naltrexone Klonopin NAC Concerta Adhansia- intrusive thoughts, grinding teeth methylphenidate   Review of Systems:  Review of Systems  Medications: I have reviewed the patient's current medications.  Current Outpatient Medications  Medication Sig Dispense Refill  . acetaminophen (TYLENOL) 500 MG tablet Take 500 mg by mouth every 6 (six) hours as needed (pain).    Marland Kitchen allopurinol (ZYLOPRIM) 300 MG tablet Take 300 mg by mouth daily.    . Cholecalciferol (VITAMIN D) 2000 units CAPS Take by mouth.    .  Cyanocobalamin (VITAMIN B-12) 1000 MCG/15ML LIQD Take by mouth.    . escitalopram (LEXAPRO) 20 MG tablet Take 1 tablet (20 mg total) by mouth daily. 90 tablet 1  . ibuprofen (ADVIL,MOTRIN) 200 MG tablet Take 800 mg by mouth every 6 (six) hours as needed.    Marland Kitchen MAGNESIUM PO Take by mouth.    . methylphenidate (CONCERTA) 36 MG PO CR tablet Take 1 tablet (36 mg total) by mouth daily. 30 tablet 0  . [START ON 09/29/2019] methylphenidate (RITALIN) 10 MG tablet Take 1/2-1 tab po q afternoon 30 tablet 0  . Multiple Vitamin (MULTIVITAMIN WITH MINERALS) TABS tablet Take 1 tablet by mouth daily.    Marland Kitchen POTASSIUM PO Take by mouth.    . pramipexole (MIRAPEX) 0.5 MG tablet Take 1.5 tablets (0.75 mg total) by mouth every evening. 135 tablet 1  . Pyridoxine HCl (VITAMIN B-6 PO) Take by mouth.    . THEANINE PO Take by mouth.    Melene Muller ON 09/15/2019] methylphenidate (CONCERTA) 36 MG PO CR tablet Take 1 tablet (36 mg total) by mouth daily. 30 tablet 0  . [START ON 10/13/2019] methylphenidate (CONCERTA) 36 MG PO CR tablet Take 1 tablet (36 mg total) by mouth daily. 30 tablet 0  . [START ON 09/01/2019] methylphenidate (RITALIN) 10 MG tablet Take 1 tablet (10 mg total) by mouth daily as needed. 30 tablet 0  . [START ON 10/27/2019] methylphenidate (RITALIN) 10 MG tablet Take 1 tablet (10 mg total) by mouth daily. 30 tablet 0   No current facility-administered medications for this visit.  Medication Side Effects: None  Allergies: No Known Allergies  Past Medical History:  Diagnosis Date  . Gout   . History of peritonsillar abscess   . RLS (restless legs syndrome)     Family History  Problem Relation Age of Onset  . Cancer Mother        breast  . Diabetes Mother   . Alcohol abuse Father   . CVA Maternal Aunt   . Suicidality Maternal Uncle   . Alcohol abuse Paternal Uncle     Social History   Socioeconomic History  . Marital status: Married    Spouse name: Not on file  . Number of children: Not  on file  . Years of education: Not on file  . Highest education level: Not on file  Occupational History  . Not on file  Social Needs  . Financial resource strain: Not on file  . Food insecurity    Worry: Not on file    Inability: Not on file  . Transportation needs    Medical: Not on file    Non-medical: Not on file  Tobacco Use  . Smoking status: Former Research scientist (life sciences)  . Smokeless tobacco: Never Used  Substance and Sexual Activity  . Alcohol use: Yes    Comment: every other day   . Drug use: No  . Sexual activity: Not on file  Lifestyle  . Physical activity    Days per week: Not on file    Minutes per session: Not on file  . Stress: Not on file  Relationships  . Social Herbalist on phone: Not on file    Gets together: Not on file    Attends religious service: Not on file    Active member of club or organization: Not on file    Attends meetings of clubs or organizations: Not on file    Relationship status: Not on file  . Intimate partner violence    Fear of current or ex partner: Not on file    Emotionally abused: Not on file    Physically abused: Not on file    Forced sexual activity: Not on file  Other Topics Concern  . Not on file  Social History Narrative  . Not on file    Past Medical History, Surgical history, Social history, and Family history were reviewed and updated as appropriate.   Please see review of systems for further details on the patient's review from today.   Objective:   Physical Exam:  BP (!) 144/95   Pulse 78   Physical Exam Constitutional:      General: He is not in acute distress.    Appearance: He is well-developed.  Musculoskeletal:        General: No deformity.  Neurological:     Mental Status: He is alert and oriented to person, place, and time.     Coordination: Coordination normal.  Psychiatric:        Attention and Perception: Attention and perception normal. He does not perceive auditory or visual hallucinations.         Mood and Affect: Mood normal. Mood is not anxious or depressed. Affect is not labile, blunt, angry or inappropriate.        Speech: Speech normal.        Behavior: Behavior normal.        Thought Content: Thought content normal. Thought content does not include homicidal or suicidal ideation. Thought content does not include homicidal or suicidal plan.  Cognition and Memory: Cognition and memory normal.        Judgment: Judgment normal.     Comments: Insight intact. No delusions.      Lab Review:     Component Value Date/Time   CREATININE 1.09 12/06/2013 0119   GFRNONAA 77 (L) 12/06/2013 0119   GFRAA 89 (L) 12/06/2013 0119       Component Value Date/Time   WBC 12.2 (H) 12/07/2013 0348   RBC 3.61 (L) 12/07/2013 0348   HGB 11.5 (L) 12/07/2013 0348   HCT 34.7 (L) 12/07/2013 0348   PLT 198 12/07/2013 0348   MCV 96.1 12/07/2013 0348   MCH 31.9 12/07/2013 0348   MCHC 33.1 12/07/2013 0348   RDW 14.0 12/07/2013 0348   LYMPHSABS 2.2 12/07/2013 0348   MONOABS 1.4 (H) 12/07/2013 0348   EOSABS 0.2 12/07/2013 0348   BASOSABS 0.0 12/07/2013 0348    No results found for: POCLITH, LITHIUM   No results found for: PHENYTOIN, PHENOBARB, VALPROATE, CBMZ   .res Assessment: Plan:   Will continue current plan of care since patient reports that his mood and anxiety signs and symptoms and attention deficit are well controlled at this time. Will continue Lexapro 20 mg daily for depression and anxiety. Continue pramipexole 0.75 mg every evening for restless leg syndrome. Patient follow-up in 2 to 3 months or sooner if clinically indicated. Patient advised to contact office with any questions, adverse effects, or acute worsening in signs and symptoms.  Dorinda HillDonald was seen today for follow-up.  Diagnoses and all orders for this visit:  Attention deficit hyperactivity disorder (ADHD), predominantly inattentive type -     methylphenidate (CONCERTA) 36 MG PO CR tablet; Take 1 tablet (36  mg total) by mouth daily. -     methylphenidate (RITALIN) 10 MG tablet; Take 1/2-1 tab po q afternoon -     methylphenidate (CONCERTA) 36 MG PO CR tablet; Take 1 tablet (36 mg total) by mouth daily. -     methylphenidate (RITALIN) 10 MG tablet; Take 1 tablet (10 mg total) by mouth daily as needed. -     methylphenidate (RITALIN) 10 MG tablet; Take 1 tablet (10 mg total) by mouth daily. -     methylphenidate (CONCERTA) 36 MG PO CR tablet; Take 1 tablet (36 mg total) by mouth daily.  Mild mood disorder (HCC)  Anxiety disorder, unspecified type  RLS (restless legs syndrome)     Please see After Visit Summary for patient specific instructions.  Future Appointments  Date Time Provider Department Center  11/17/2019  4:30 PM Corie Chiquitoarter, Bisma Klett, PMHNP [facility] None    No orders of the defined types were placed in this encounter.   -------------------------------

## 2019-11-17 ENCOUNTER — Other Ambulatory Visit: Payer: Self-pay

## 2019-11-17 ENCOUNTER — Ambulatory Visit (INDEPENDENT_AMBULATORY_CARE_PROVIDER_SITE_OTHER): Payer: 59 | Admitting: Psychiatry

## 2019-11-17 DIAGNOSIS — Z91199 Patient's noncompliance with other medical treatment and regimen due to unspecified reason: Secondary | ICD-10-CM

## 2019-11-17 DIAGNOSIS — Z5329 Procedure and treatment not carried out because of patient's decision for other reasons: Secondary | ICD-10-CM

## 2019-11-17 NOTE — Progress Notes (Signed)
Attempted to reach pt x 2 for televisit. No answer.

## 2019-11-20 ENCOUNTER — Encounter: Payer: Self-pay | Admitting: Psychiatry

## 2019-11-20 ENCOUNTER — Other Ambulatory Visit: Payer: Self-pay

## 2019-11-20 ENCOUNTER — Ambulatory Visit (INDEPENDENT_AMBULATORY_CARE_PROVIDER_SITE_OTHER): Payer: BC Managed Care – PPO | Admitting: Psychiatry

## 2019-11-20 VITALS — BP 151/97 | HR 76

## 2019-11-20 DIAGNOSIS — G2581 Restless legs syndrome: Secondary | ICD-10-CM

## 2019-11-20 DIAGNOSIS — F419 Anxiety disorder, unspecified: Secondary | ICD-10-CM

## 2019-11-20 DIAGNOSIS — F39 Unspecified mood [affective] disorder: Secondary | ICD-10-CM

## 2019-11-20 DIAGNOSIS — F900 Attention-deficit hyperactivity disorder, predominantly inattentive type: Secondary | ICD-10-CM

## 2019-11-20 MED ORDER — METHYLPHENIDATE HCL ER (OSM) 36 MG PO TBCR: 36.0000 mg | EXTENDED_RELEASE_TABLET | Freq: Every day | ORAL | 0 refills | Status: DC

## 2019-11-20 MED ORDER — PROPRANOLOL HCL 10 MG PO TABS: ORAL_TABLET | ORAL | 1 refills | Status: DC

## 2019-11-20 MED ORDER — METHYLPHENIDATE HCL 10 MG PO TABS: 10.0000 mg | ORAL_TABLET | Freq: Every day | ORAL | 0 refills | Status: DC | PRN

## 2019-11-20 MED ORDER — ESCITALOPRAM OXALATE 20 MG PO TABS: 20.0000 mg | ORAL_TABLET | Freq: Every day | ORAL | 1 refills | Status: DC

## 2019-11-20 MED ORDER — PRAMIPEXOLE DIHYDROCHLORIDE 0.5 MG PO TABS: 0.7250 mg | ORAL_TABLET | Freq: Every evening | ORAL | 1 refills | Status: DC

## 2019-11-20 NOTE — Progress Notes (Signed)
Timothy Camacho [id number] 12-16-1961 58 y.o.  Subjective:   Patient ID:  Timothy Camacho is a 58 y.o. (DOB 12-16-1961) male.  Chief Complaint:  Chief Complaint  Patient presents with  . Follow-up    ADD  . Anxiety  . Depression    HPI Timothy Camacho presents to the office today for follow-up of mood disturbance, anxiety, and ADD. He reports that he is "depressed, irritated, and agitated" in response to work stress. He reports disrupted sleep due to thinking about stressors. Appetite is ok. He reports that his energy and motivation are ok "but it has to be something that I really want to do." He reports Concerta remains effective for his concentration and takes Ritalin in the afternoon. Denies SI.   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 Working longer hours, 7:30 am-6 pm. Has lost 2 friends recently to suicide.    Past Medication Trials: Lexapro Celexa Wellbutrin XL Olanzapine Risperdal Trileptal Lamictal Gabapentin Topamax Naltrexone Klonopin NAC Concerta Adhansia- intrusive thoughts, grinding teeth methylphenidate   Review of Systems:  Review of Systems  Medications: I have reviewed the patient's current medications.  Current Outpatient Medications  Medication Sig Dispense Refill  . acetaminophen (TYLENOL) 500 MG tablet Take 500 mg by mouth every 6 (six) hours as needed (pain).    Marland Kitchen. allopurinol (ZYLOPRIM) 300 MG tablet Take 300 mg by mouth daily.    . Cholecalciferol (VITAMIN D) 2000 units CAPS Take by mouth.    . Cyanocobalamin (VITAMIN B-12) 1000 MCG/15ML LIQD Take by mouth.    . escitalopram (LEXAPRO) 20 MG tablet Take 1 tablet (20 mg total) by mouth daily. 90 tablet 1  . ibuprofen (ADVIL,MOTRIN) 200 MG tablet Take 800 mg by mouth every 6 (six) hours as needed.    Marland Kitchen. MAGNESIUM PO Take by mouth.    Melene Muller. [START ON 01/18/2020] methylphenidate (CONCERTA) 36 MG PO CR tablet Take 1 tablet (36 mg total) by mouth  daily. 30 tablet 0  . [START ON 12/21/2019] methylphenidate (CONCERTA) 36 MG PO CR tablet Take 1 tablet (36 mg total) by mouth daily. 30 tablet 0  . [START ON 11/23/2019] methylphenidate (CONCERTA) 36 MG PO CR tablet Take 1 tablet (36 mg total) by mouth daily. 30 tablet 0  . methylphenidate (RITALIN) 10 MG tablet Take 1/2-1 tab po q afternoon 30 tablet 0  . methylphenidate (RITALIN) 10 MG tablet Take 1 tablet (10 mg total) by mouth daily. 30 tablet 0  . [START ON 12/18/2019] methylphenidate (RITALIN) 10 MG tablet Take 1 tablet (10 mg total) by mouth daily as needed. 30 tablet 0  . Multiple Vitamin (MULTIVITAMIN WITH MINERALS) TABS tablet Take 1 tablet by mouth daily.    Marland Kitchen. POTASSIUM PO Take by mouth.    . pramipexole (MIRAPEX) 0.5 MG tablet Take 1.5 tablets (0.75 mg total) by mouth every evening. 135 tablet 1  . propranolol (INDERAL) 10 MG tablet Take 1-2 tabs po BID prn anxiety 120 tablet 1  . Pyridoxine HCl (VITAMIN B-6 PO) Take by mouth.    . THEANINE PO Take by mouth.     No current facility-administered medications for this visit.     Medication Side Effects: None  Allergies: No Known Allergies  Past Medical History:  Diagnosis Date  . Gout   . History of peritonsillar abscess   . RLS (restless legs syndrome)     Family History  Problem Relation Age of Onset  . Cancer Mother  breast  . Diabetes Mother   . Alcohol abuse Father   . CVA Maternal Aunt   . Suicidality Maternal Uncle   . Alcohol abuse Paternal Uncle     Social History   Socioeconomic History  . Marital status: Married    Spouse name: Not on file  . Number of children: Not on file  . Years of education: Not on file  . Highest education level: Not on file  Occupational History  . Not on file  Social Needs  . Financial resource strain: Not on file  . Food insecurity    Worry: Not on file    Inability: Not on file  . Transportation needs    Medical: Not on file    Non-medical: Not on file  Tobacco  Use  . Smoking status: Former Games developer  . Smokeless tobacco: Never Used  Substance and Sexual Activity  . Alcohol use: Yes    Comment: every other day   . Drug use: No  . Sexual activity: Not on file  Lifestyle  . Physical activity    Days per week: Not on file    Minutes per session: Not on file  . Stress: Not on file  Relationships  . Social Musician on phone: Not on file    Gets together: Not on file    Attends religious service: Not on file    Active member of club or organization: Not on file    Attends meetings of clubs or organizations: Not on file    Relationship status: Not on file  . Intimate partner violence    Fear of current or ex partner: Not on file    Emotionally abused: Not on file    Physically abused: Not on file    Forced sexual activity: Not on file  Other Topics Concern  . Not on file  Social History Narrative  . Not on file    Past Medical History, Surgical history, Social history, and Family history were reviewed and updated as appropriate.   Please see review of systems for further details on the patient's review from today.   Objective:   Physical Exam:  BP (!) 151/97   Pulse 76   Physical Exam Constitutional:      General: He is not in acute distress.    Appearance: He is well-developed.  Musculoskeletal:        General: No deformity.  Neurological:     Mental Status: He is alert and oriented to person, place, and time.     Coordination: Coordination normal.  Psychiatric:        Attention and Perception: Attention and perception normal. He does not perceive auditory or visual hallucinations.        Mood and Affect: Mood is anxious and depressed. Affect is not labile, blunt, angry or inappropriate.        Speech: Speech normal.        Behavior: Behavior normal.        Thought Content: Thought content normal. Thought content is not paranoid or delusional. Thought content does not include homicidal or suicidal ideation. Thought  content does not include homicidal or suicidal plan.        Cognition and Memory: Cognition and memory normal.        Judgment: Judgment normal.     Comments: Insight intact.      Lab Review:     Component Value Date/Time   CREATININE 1.09 12/06/2013 0119  GFRNONAA 77 (L) 12/06/2013 0119   GFRAA 89 (L) 12/06/2013 0119       Component Value Date/Time   WBC 12.2 (H) 12/07/2013 0348   RBC 3.61 (L) 12/07/2013 0348   HGB 11.5 (L) 12/07/2013 0348   HCT 34.7 (L) 12/07/2013 0348   PLT 198 12/07/2013 0348   MCV 96.1 12/07/2013 0348   MCH 31.9 12/07/2013 0348   MCHC 33.1 12/07/2013 0348   RDW 14.0 12/07/2013 0348   LYMPHSABS 2.2 12/07/2013 0348   MONOABS 1.4 (H) 12/07/2013 0348   EOSABS 0.2 12/07/2013 0348   BASOSABS 0.0 12/07/2013 0348    No results found for: POCLITH, LITHIUM   No results found for: PHENYTOIN, PHENOBARB, VALPROATE, CBMZ   .res Assessment: Plan:   Discussed treatment options for recent anxiety and worsening mood in response to work stressors.  Discussed that it may be beneficial to resume psychotherapy and patient reports that he will consider this.  Patient also reports some ambivalence about making medication changes considering history of adverse effects with several different medications in the past.  Discussed potential benefits, risks, and side effects of propanolol as needed since propanolol could be easily stopped if adverse effects occurred and could be taken either routinely or only as needed.  Discussed that propanolol may be helpful for anxiety, irritability, and increased heart rate since patient's blood pressure is elevated on exam today and patient attributes this to feeling stressed and "agitated."  Patient agrees to trial of propanolol. Will continue Concerta 36 mg p.o. every morning and Ritalin 10 mg daily as needed for attention deficit. Continue Lexapro 20 mg daily for anxiety and depression. Continue pramipexole for RLS. Patient to follow-up in  3 months or sooner if clinically indicated. Patient advised to contact office with any questions, adverse effects, or acute worsening in signs and symptoms.  Timothy Camacho was seen today for follow-up, anxiety and depression.  Diagnoses and all orders for this visit:  Anxiety disorder, unspecified type -     propranolol (INDERAL) 10 MG tablet; Take 1-2 tabs po BID prn anxiety -     escitalopram (LEXAPRO) 20 MG tablet; Take 1 tablet (20 mg total) by mouth daily.  Attention deficit hyperactivity disorder (ADHD), predominantly inattentive type -     methylphenidate (CONCERTA) 36 MG PO CR tablet; Take 1 tablet (36 mg total) by mouth daily. -     methylphenidate (CONCERTA) 36 MG PO CR tablet; Take 1 tablet (36 mg total) by mouth daily. -     methylphenidate (CONCERTA) 36 MG PO CR tablet; Take 1 tablet (36 mg total) by mouth daily. -     methylphenidate (RITALIN) 10 MG tablet; Take 1 tablet (10 mg total) by mouth daily as needed.  Mild mood disorder (HCC) -     escitalopram (LEXAPRO) 20 MG tablet; Take 1 tablet (20 mg total) by mouth daily.  RLS (restless legs syndrome) -     pramipexole (MIRAPEX) 0.5 MG tablet; Take 1.5 tablets (0.75 mg total) by mouth every evening.     Please see After Visit Summary for patient specific instructions.  Future Appointments  Date Time Provider [place]  12/31/2019  8:30 AM Thayer Headings, PMHNP [facility] None    No orders of the defined types were placed in this encounter.   -------------------------------

## 2019-12-02 DIAGNOSIS — Z20828 Contact with and (suspected) exposure to other viral communicable diseases: Secondary | ICD-10-CM | POA: Diagnosis not present

## 2019-12-02 DIAGNOSIS — R519 Headache, unspecified: Secondary | ICD-10-CM | POA: Diagnosis not present

## 2019-12-31 ENCOUNTER — Encounter: Payer: Self-pay | Admitting: Psychiatry

## 2019-12-31 ENCOUNTER — Ambulatory Visit (INDEPENDENT_AMBULATORY_CARE_PROVIDER_SITE_OTHER): Payer: BC Managed Care – PPO | Admitting: Psychiatry

## 2019-12-31 VITALS — BP 119/74

## 2019-12-31 DIAGNOSIS — F39 Unspecified mood [affective] disorder: Secondary | ICD-10-CM | POA: Diagnosis not present

## 2019-12-31 DIAGNOSIS — G2581 Restless legs syndrome: Secondary | ICD-10-CM | POA: Diagnosis not present

## 2019-12-31 DIAGNOSIS — F419 Anxiety disorder, unspecified: Secondary | ICD-10-CM | POA: Diagnosis not present

## 2019-12-31 DIAGNOSIS — F900 Attention-deficit hyperactivity disorder, predominantly inattentive type: Secondary | ICD-10-CM | POA: Diagnosis not present

## 2019-12-31 MED ORDER — METHYLPHENIDATE HCL ER (OSM) 36 MG PO TBCR: 36.0000 mg | EXTENDED_RELEASE_TABLET | Freq: Every day | ORAL | 0 refills | Status: DC

## 2019-12-31 MED ORDER — METHYLPHENIDATE HCL 10 MG PO TABS: 10.0000 mg | ORAL_TABLET | Freq: Every day | ORAL | 0 refills | Status: DC

## 2019-12-31 NOTE — Progress Notes (Signed)
Timothy Camacho [id number] 1961-10-09 59 y.o.  Virtual Visit via Telephone Note  I connected with pt on 12/31/19 at  8:30 AM EST by telephone and verified that I am speaking with the correct person using two identifiers.   I discussed the limitations, risks, security and privacy concerns of performing an evaluation and management service by telephone and the availability of in person appointments. I also discussed with the patient that there may be a patient responsible charge related to this service. The patient expressed understanding and agreed to proceed.   I discussed the assessment and treatment plan with the patient. The patient was provided an opportunity to ask questions and all were answered. The patient agreed with the plan and demonstrated an understanding of the instructions.   The patient was advised to call back or seek an in-person evaluation if the symptoms worsen or if the condition fails to improve as anticipated.  I provided 25 minutes of non-face-to-face time during this encounter.  The patient was located at home.  The provider was located at [facility].   Corie Chiquito, PMHNP   Subjective:   Patient ID:  Timothy Camacho is a 59 y.o. (DOB [birth year]) male.  Chief Complaint:  Chief Complaint  Patient presents with  . Follow-up    h/o Depression, Anxiety, ADD, and sleep disturbance    HPI Timothy Camacho presents for follow-up of ADD, Anxiety, and mood disturbance. He reports that he is dissatisfied with current job. "I feel trapped" and that his job options are limited. He reports that it is difficult to "get up and get going" and once he gets to work he is ok. He reports that his mood is better when he does not drink beer, even small 2-3 beers. He reports that he has been avoiding ETOH intake due to negative impact on his mood.   He reports that propranolol has been helpful for anxiety, irritability, and insomnia. Reports that if he does not take  propranolol at night when he is feeling anxious he will wake up SOB and "in a panic." He reports less middle of the night awakenings. Improved RLS. He reports that he typically takes Propranolol in the morning before work. He reports that he will occasionally take Propranolol prn in the middle of the day if he has acute stress. Appetite has been increased- "food is my comfort." Concentration depends on several factors to include his mood and anxiety. He reports that medication continues to be effective for concentration. Denies SI.   He reports that he has started to exercise again 2-3 times a week.   Past Medication Trials: Lexapro Celexa Wellbutrin XL Olanzapine Risperdal Trileptal Lamictal Gabapentin Topamax Naltrexone Klonopin NAC Concerta Adhansia- intrusive thoughts, grinding teeth methylphenidate   Review of Systems:  Review of Systems  Cardiovascular: Negative for palpitations.  Musculoskeletal: Negative for gait problem.  Neurological: Negative for dizziness, tremors and light-headedness.  Psychiatric/Behavioral:       Please refer to HPI    Medications: I have reviewed the patient's current medications.  Current Outpatient Medications  Medication Sig Dispense Refill  . acetaminophen (TYLENOL) 500 MG tablet Take 500 mg by mouth every 6 (six) hours as needed (pain).    Marland Kitchen allopurinol (ZYLOPRIM) 300 MG tablet Take 300 mg by mouth daily.    . Cholecalciferol (VITAMIN D) 2000 units CAPS Take by mouth.    . Cyanocobalamin (VITAMIN B-12) 1000 MCG/15ML LIQD Take by mouth.    . escitalopram (LEXAPRO) 20 MG tablet  Take 1 tablet (20 mg total) by mouth daily. 90 tablet 1  . ibuprofen (ADVIL,MOTRIN) 200 MG tablet Take 800 mg by mouth every 6 (six) hours as needed.    Marland Kitchen MAGNESIUM PO Take by mouth.    Melene Muller ON 01/18/2020] methylphenidate (CONCERTA) 36 MG PO CR tablet Take 1 tablet (36 mg total) by mouth daily. 30 tablet 0  . methylphenidate (CONCERTA) 36 MG PO CR tablet Take 1  tablet (36 mg total) by mouth daily. 30 tablet 0  . [START ON 02/15/2020] methylphenidate (CONCERTA) 36 MG PO CR tablet Take 1 tablet (36 mg total) by mouth daily. 30 tablet 0  . methylphenidate (RITALIN) 10 MG tablet Take 1/2-1 tab po q afternoon 30 tablet 0  . methylphenidate (RITALIN) 10 MG tablet Take 1 tablet (10 mg total) by mouth daily as needed. 30 tablet 0  . [START ON 01/28/2020] methylphenidate (RITALIN) 10 MG tablet Take 1 tablet (10 mg total) by mouth daily. 30 tablet 0  . Multiple Vitamin (MULTIVITAMIN WITH MINERALS) TABS tablet Take 1 tablet by mouth daily.    Marland Kitchen POTASSIUM PO Take by mouth.    . pramipexole (MIRAPEX) 0.5 MG tablet Take 1.5 tablets (0.75 mg total) by mouth every evening. 135 tablet 1  . propranolol (INDERAL) 10 MG tablet Take 1-2 tabs po BID prn anxiety 120 tablet 1  . Pyridoxine HCl (VITAMIN B-6 PO) Take by mouth.    . THEANINE PO Take by mouth.     No current facility-administered medications for this visit.    Medication Side Effects: None  Allergies: No Known Allergies  Past Medical History:  Diagnosis Date  . Gout   . History of peritonsillar abscess   . RLS (restless legs syndrome)     Family History  Problem Relation Age of Onset  . Cancer Mother        breast  . Diabetes Mother   . Alcohol abuse Father   . CVA Maternal Aunt   . Suicidality Maternal Uncle   . Alcohol abuse Paternal Uncle     Social History   Socioeconomic History  . Marital status: Married    Spouse name: Not on file  . Number of children: Not on file  . Years of education: Not on file  . Highest education level: Not on file  Occupational History  . Not on file  Tobacco Use  . Smoking status: Former Games developer  . Smokeless tobacco: Never Used  Substance and Sexual Activity  . Alcohol use: Yes    Comment: every other day   . Drug use: No  . Sexual activity: Not on file  Other Topics Concern  . Not on file  Social History Narrative  . Not on file   Social  Determinants of Health   Financial Resource Strain:   . Difficulty of Paying Living Expenses: Not on file  Food Insecurity:   . Worried About Programme researcher, broadcasting/film/video in the Last Year: Not on file  . Ran Out of Food in the Last Year: Not on file  Transportation Needs:   . Lack of Transportation (Medical): Not on file  . Lack of Transportation (Non-Medical): Not on file  Physical Activity:   . Days of Exercise per Week: Not on file  . Minutes of Exercise per Session: Not on file  Stress:   . Feeling of Stress : Not on file  Social Connections:   . Frequency of Communication with Friends and Family: Not on file  .  Frequency of Social Gatherings with Friends and Family: Not on file  . Attends Religious Services: Not on file  . Active Member of Clubs or Organizations: Not on file  . Attends Banker Meetings: Not on file  . Marital Status: Not on file  Intimate Partner Violence:   . Fear of Current or Ex-Partner: Not on file  . Emotionally Abused: Not on file  . Physically Abused: Not on file  . Sexually Abused: Not on file    Past Medical History, Surgical history, Social history, and Family history were reviewed and updated as appropriate.   Please see review of systems for further details on the patient's review from today.   Objective:   Physical Exam:  BP 119/74   Physical Exam Neurological:     Mental Status: He is alert and oriented to person, place, and time.     Cranial Nerves: No dysarthria.  Psychiatric:        Attention and Perception: Attention and perception normal.        Mood and Affect: Mood normal.        Speech: Speech normal.        Behavior: Behavior is cooperative.        Thought Content: Thought content normal. Thought content is not paranoid or delusional. Thought content does not include homicidal or suicidal ideation. Thought content does not include homicidal or suicidal plan.        Cognition and Memory: Cognition and memory normal.         Judgment: Judgment normal.     Comments: Insight intact     Lab Review:     Component Value Date/Time   CREATININE 1.09 12/06/2013 0119   GFRNONAA 77 (L) 12/06/2013 0119   GFRAA 89 (L) 12/06/2013 0119       Component Value Date/Time   WBC 12.2 (H) 12/07/2013 0348   RBC 3.61 (L) 12/07/2013 0348   HGB 11.5 (L) 12/07/2013 0348   HCT 34.7 (L) 12/07/2013 0348   PLT 198 12/07/2013 0348   MCV 96.1 12/07/2013 0348   MCH 31.9 12/07/2013 0348   MCHC 33.1 12/07/2013 0348   RDW 14.0 12/07/2013 0348   LYMPHSABS 2.2 12/07/2013 0348   MONOABS 1.4 (H) 12/07/2013 0348   EOSABS 0.2 12/07/2013 0348   BASOSABS 0.0 12/07/2013 0348    No results found for: POCLITH, LITHIUM   No results found for: PHENYTOIN, PHENOBARB, VALPROATE, CBMZ   .res Assessment: Plan:   Pt reports that mood and anxiety s/s seem to be well controlled considering current stressors and would like to continue current medications without changes. He reports that Concerta and Ritalin remain effective without any tolerability issues.  Will continue propanolol since patient reports that this has been helpful for anxiety, insomnia, and RLS. Will continue current plan of care since target signs and symptoms are well controlled without any tolerability issues. Patient to follow-up in 2 months or sooner if clinically indicated. Patient advised to contact office with any questions, adverse effects, or acute worsening in signs and symptoms.  Ky was seen today for follow-up.  Diagnoses and all orders for this visit:  Attention deficit hyperactivity disorder (ADHD), predominantly inattentive type -     methylphenidate (RITALIN) 10 MG tablet; Take 1 tablet (10 mg total) by mouth daily. -     methylphenidate (CONCERTA) 36 MG PO CR tablet; Take 1 tablet (36 mg total) by mouth daily.  Mood disorder (HCC)  Anxiety disorder, unspecified type  RLS (  restless legs syndrome)    Please see After Visit Summary for patient  specific instructions.  Future Appointments  Date Time Provider [place]  03/01/2020  8:30 AM Thayer Headings, PMHNP CP-CP None    No orders of the defined types were placed in this encounter.     -------------------------------

## 2020-01-21 ENCOUNTER — Other Ambulatory Visit: Payer: Self-pay | Admitting: Psychiatry

## 2020-01-21 DIAGNOSIS — F419 Anxiety disorder, unspecified: Secondary | ICD-10-CM

## 2020-02-10 DIAGNOSIS — M62838 Other muscle spasm: Secondary | ICD-10-CM | POA: Diagnosis not present

## 2020-02-10 DIAGNOSIS — M9901 Segmental and somatic dysfunction of cervical region: Secondary | ICD-10-CM | POA: Diagnosis not present

## 2020-02-10 DIAGNOSIS — M9902 Segmental and somatic dysfunction of thoracic region: Secondary | ICD-10-CM | POA: Diagnosis not present

## 2020-02-10 DIAGNOSIS — M542 Cervicalgia: Secondary | ICD-10-CM | POA: Diagnosis not present

## 2020-02-18 DIAGNOSIS — M9903 Segmental and somatic dysfunction of lumbar region: Secondary | ICD-10-CM | POA: Diagnosis not present

## 2020-02-18 DIAGNOSIS — M62830 Muscle spasm of back: Secondary | ICD-10-CM | POA: Diagnosis not present

## 2020-02-18 DIAGNOSIS — M9901 Segmental and somatic dysfunction of cervical region: Secondary | ICD-10-CM | POA: Diagnosis not present

## 2020-02-18 DIAGNOSIS — M545 Low back pain: Secondary | ICD-10-CM | POA: Diagnosis not present

## 2020-02-29 DIAGNOSIS — M9901 Segmental and somatic dysfunction of cervical region: Secondary | ICD-10-CM | POA: Diagnosis not present

## 2020-02-29 DIAGNOSIS — M62830 Muscle spasm of back: Secondary | ICD-10-CM | POA: Diagnosis not present

## 2020-02-29 DIAGNOSIS — M9903 Segmental and somatic dysfunction of lumbar region: Secondary | ICD-10-CM | POA: Diagnosis not present

## 2020-02-29 DIAGNOSIS — M545 Low back pain: Secondary | ICD-10-CM | POA: Diagnosis not present

## 2020-03-01 ENCOUNTER — Encounter: Payer: Self-pay | Admitting: Psychiatry

## 2020-03-01 ENCOUNTER — Other Ambulatory Visit: Payer: Self-pay

## 2020-03-01 ENCOUNTER — Ambulatory Visit (INDEPENDENT_AMBULATORY_CARE_PROVIDER_SITE_OTHER): Payer: BC Managed Care – PPO | Admitting: Psychiatry

## 2020-03-01 DIAGNOSIS — F900 Attention-deficit hyperactivity disorder, predominantly inattentive type: Secondary | ICD-10-CM | POA: Diagnosis not present

## 2020-03-01 DIAGNOSIS — F39 Unspecified mood [affective] disorder: Secondary | ICD-10-CM

## 2020-03-01 DIAGNOSIS — F419 Anxiety disorder, unspecified: Secondary | ICD-10-CM

## 2020-03-01 MED ORDER — METHYLPHENIDATE HCL ER (OSM) 36 MG PO TBCR: 36.0000 mg | EXTENDED_RELEASE_TABLET | Freq: Every day | ORAL | 0 refills | Status: DC

## 2020-03-01 MED ORDER — METHYLPHENIDATE HCL 10 MG PO TABS: ORAL_TABLET | ORAL | 0 refills | Status: DC

## 2020-03-01 MED ORDER — METHYLPHENIDATE HCL 10 MG PO TABS: 10.0000 mg | ORAL_TABLET | Freq: Every day | ORAL | 0 refills | Status: DC

## 2020-03-01 MED ORDER — PROPRANOLOL HCL 10 MG PO TABS: ORAL_TABLET | ORAL | 1 refills | Status: DC

## 2020-03-01 MED ORDER — METHYLPHENIDATE HCL 10 MG PO TABS: 10.0000 mg | ORAL_TABLET | Freq: Every day | ORAL | 0 refills | Status: DC | PRN

## 2020-03-01 MED ORDER — ESCITALOPRAM OXALATE 20 MG PO TABS: 20.0000 mg | ORAL_TABLET | Freq: Every day | ORAL | 1 refills | Status: DC

## 2020-03-01 NOTE — Progress Notes (Signed)
Timothy Camacho [id number] Mar 09, 1961 59 y.o.  Subjective:   Patient ID:  Timothy Camacho is a 59 y.o. (DOB 1961-04-11) male.  Chief Complaint:  Chief Complaint  Patient presents with  . Follow-up    ADD, h/o depression    HPI Timothy Camacho presents to the office today for follow-up of ADD and depression.  Patient reports that there have not been any significant changes since last visit.  He reports that his concentration is improved with medications.  He reports that his mood has been "okay."  He reports that his motivation varies and is slightly lower at times.  Energy has also been low at times.  He reports concern about weight gain over the past year and reports that he is currently at his heaviest weight.  He denies any significant changes in his appetite or food intake.  He reports that his sleep has been disrupted due to shoulder pain.  He denies significant anxiety.  He reports, "I wish there was little more relaxed states."  Denies SI.  Past Medication Trials: Lexapro Celexa Wellbutrin XL Olanzapine Risperdal Trileptal Lamictal Gabapentin Topamax Naltrexone Klonopin NAC Concerta Adhansia- intrusive thoughts, grinding teeth methylphenidate    Review of Systems:  Review of Systems  Respiratory: Positive for shortness of breath.   Musculoskeletal: Positive for back pain. Negative for gait problem.  Skin:       Changes in fingernails  Neurological: Negative for tremors.  Psychiatric/Behavioral:       Please refer to HPI    Medications: I have reviewed the patient's current medications.  Current Outpatient Medications  Medication Sig Dispense Refill  . [START ON 03/29/2020] methylphenidate (CONCERTA) 36 MG PO CR tablet Take 1 tablet (36 mg total) by mouth daily. 30 tablet 0  . propranolol (INDERAL) 10 MG tablet TAKE 1 TO 2 TABLETS BY MOUTH TWICE A DAY AS NEEDED FOR ANXIETY 120 tablet 1  . acetaminophen (TYLENOL) 500 MG tablet Take 500 mg by mouth every 6 (six)  hours as needed (pain).    Marland Kitchen allopurinol (ZYLOPRIM) 300 MG tablet Take 300 mg by mouth daily.    . Cholecalciferol (VITAMIN D) 2000 units CAPS Take by mouth.    . Cyanocobalamin (VITAMIN B-12) 1000 MCG/15ML LIQD Take by mouth.    . escitalopram (LEXAPRO) 20 MG tablet Take 1 tablet (20 mg total) by mouth daily. 90 tablet 1  . ibuprofen (ADVIL,MOTRIN) 200 MG tablet Take 800 mg by mouth every 6 (six) hours as needed.    Marland Kitchen MAGNESIUM PO Take by mouth.    Derrill Memo ON 04/26/2020] methylphenidate (CONCERTA) 36 MG PO CR tablet Take 1 tablet (36 mg total) by mouth daily. 30 tablet 0  . methylphenidate (CONCERTA) 36 MG PO CR tablet Take 1 tablet (36 mg total) by mouth daily. 30 tablet 0  . [START ON 04/26/2020] methylphenidate (RITALIN) 10 MG tablet Take 1/2-1 tab po q afternoon 30 tablet 0  . [START ON 03/29/2020] methylphenidate (RITALIN) 10 MG tablet Take 1 tablet (10 mg total) by mouth daily as needed. 30 tablet 0  . methylphenidate (RITALIN) 10 MG tablet Take 1 tablet (10 mg total) by mouth daily. 30 tablet 0  . Multiple Vitamin (MULTIVITAMIN WITH MINERALS) TABS tablet Take 1 tablet by mouth daily.    Marland Kitchen POTASSIUM PO Take by mouth.    . pramipexole (MIRAPEX) 0.5 MG tablet Take 1.5 tablets (0.75 mg total) by mouth every evening. 135 tablet 1  . Pyridoxine HCl (VITAMIN B-6 PO) Take  by mouth.    . THEANINE PO Take by mouth.     No current facility-administered medications for this visit.    Medication Side Effects: None  Allergies: No Known Allergies  Past Medical History:  Diagnosis Date  . Gout   . History of peritonsillar abscess   . RLS (restless legs syndrome)     Family History  Problem Relation Age of Onset  . Cancer Mother        breast  . Diabetes Mother   . Alcohol abuse Father   . CVA Maternal Aunt   . Suicidality Maternal Uncle   . Alcohol abuse Paternal Uncle     Social History   Socioeconomic History  . Marital status: Married    Spouse name: Not on file  . Number of  children: Not on file  . Years of education: Not on file  . Highest education level: Not on file  Occupational History  . Not on file  Tobacco Use  . Smoking status: Former Games developer  . Smokeless tobacco: Never Used  Substance and Sexual Activity  . Alcohol use: Yes    Comment: every other day   . Drug use: No  . Sexual activity: Not on file  Other Topics Concern  . Not on file  Social History Narrative  . Not on file   Social Determinants of Health   Financial Resource Strain:   . Difficulty of Paying Living Expenses:   Food Insecurity:   . Worried About Programme researcher, broadcasting/film/video in the Last Year:   . Barista in the Last Year:   Transportation Needs:   . Freight forwarder (Medical):   Marland Kitchen Lack of Transportation (Non-Medical):   Physical Activity:   . Days of Exercise per Week:   . Minutes of Exercise per Session:   Stress:   . Feeling of Stress :   Social Connections:   . Frequency of Communication with Friends and Family:   . Frequency of Social Gatherings with Friends and Family:   . Attends Religious Services:   . Active Member of Clubs or Organizations:   . Attends Banker Meetings:   Marland Kitchen Marital Status:   Intimate Partner Violence:   . Fear of Current or Ex-Partner:   . Emotionally Abused:   Marland Kitchen Physically Abused:   . Sexually Abused:     Past Medical History, Surgical history, Social history, and Family history were reviewed and updated as appropriate.   Please see review of systems for further details on the patient's review from today.   Objective:   Physical Exam:  BP 105/69   Pulse 61   Physical Exam Constitutional:      General: He is not in acute distress. Musculoskeletal:        General: No deformity.  Neurological:     Mental Status: He is alert and oriented to person, place, and time.     Coordination: Coordination normal.  Psychiatric:        Attention and Perception: Attention and perception normal. He does not perceive  auditory or visual hallucinations.        Mood and Affect: Mood normal. Mood is not anxious or depressed. Affect is not labile, blunt, angry or inappropriate.        Speech: Speech normal.        Behavior: Behavior normal.        Thought Content: Thought content normal. Thought content is not paranoid or delusional. Thought  content does not include homicidal or suicidal ideation. Thought content does not include homicidal or suicidal plan.        Cognition and Memory: Cognition and memory normal.        Judgment: Judgment normal.     Comments: Insight intact     Lab Review:     Component Value Date/Time   CREATININE 1.09 12/06/2013 0119   GFRNONAA 77 (L) 12/06/2013 0119   GFRAA 89 (L) 12/06/2013 0119       Component Value Date/Time   WBC 12.2 (H) 12/07/2013 0348   RBC 3.61 (L) 12/07/2013 0348   HGB 11.5 (L) 12/07/2013 0348   HCT 34.7 (L) 12/07/2013 0348   PLT 198 12/07/2013 0348   MCV 96.1 12/07/2013 0348   MCH 31.9 12/07/2013 0348   MCHC 33.1 12/07/2013 0348   RDW 14.0 12/07/2013 0348   LYMPHSABS 2.2 12/07/2013 0348   MONOABS 1.4 (H) 12/07/2013 0348   EOSABS 0.2 12/07/2013 0348   BASOSABS 0.0 12/07/2013 0348    No results found for: POCLITH, LITHIUM   No results found for: PHENYTOIN, PHENOBARB, VALPROATE, CBMZ   .res Assessment: Plan:   Will continue current plan of care since target signs and symptoms are well controlled without any tolerability issues. Patient to follow-up in 3 months or sooner if clinically indicated. Patient advised to contact office with any questions, adverse effects, or acute worsening in signs and symptoms.  Timothy Camacho was seen today for follow-up.  Diagnoses and all orders for this visit:  Attention deficit hyperactivity disorder (ADHD), predominantly inattentive type -     methylphenidate (CONCERTA) 36 MG PO CR tablet; Take 1 tablet (36 mg total) by mouth daily. -     methylphenidate (CONCERTA) 36 MG PO CR tablet; Take 1 tablet (36 mg total)  by mouth daily. -     methylphenidate (CONCERTA) 36 MG PO CR tablet; Take 1 tablet (36 mg total) by mouth daily. -     methylphenidate (RITALIN) 10 MG tablet; Take 1/2-1 tab po q afternoon -     methylphenidate (RITALIN) 10 MG tablet; Take 1 tablet (10 mg total) by mouth daily as needed. -     methylphenidate (RITALIN) 10 MG tablet; Take 1 tablet (10 mg total) by mouth daily.  Anxiety disorder, unspecified type -     propranolol (INDERAL) 10 MG tablet; TAKE 1 TO 2 TABLETS BY MOUTH TWICE A DAY AS NEEDED FOR ANXIETY -     escitalopram (LEXAPRO) 20 MG tablet; Take 1 tablet (20 mg total) by mouth daily.  Mild mood disorder (HCC) -     escitalopram (LEXAPRO) 20 MG tablet; Take 1 tablet (20 mg total) by mouth daily.     Please see After Visit Summary for patient specific instructions.  No future appointments.  No orders of the defined types were placed in this encounter.   -------------------------------

## 2020-03-02 DIAGNOSIS — M545 Low back pain: Secondary | ICD-10-CM | POA: Diagnosis not present

## 2020-03-02 DIAGNOSIS — M62830 Muscle spasm of back: Secondary | ICD-10-CM | POA: Diagnosis not present

## 2020-03-02 DIAGNOSIS — M9901 Segmental and somatic dysfunction of cervical region: Secondary | ICD-10-CM | POA: Diagnosis not present

## 2020-03-02 DIAGNOSIS — M9903 Segmental and somatic dysfunction of lumbar region: Secondary | ICD-10-CM | POA: Diagnosis not present

## 2020-03-07 DIAGNOSIS — M9901 Segmental and somatic dysfunction of cervical region: Secondary | ICD-10-CM | POA: Diagnosis not present

## 2020-03-07 DIAGNOSIS — M545 Low back pain: Secondary | ICD-10-CM | POA: Diagnosis not present

## 2020-03-07 DIAGNOSIS — M9903 Segmental and somatic dysfunction of lumbar region: Secondary | ICD-10-CM | POA: Diagnosis not present

## 2020-03-07 DIAGNOSIS — M62830 Muscle spasm of back: Secondary | ICD-10-CM | POA: Diagnosis not present

## 2020-03-09 DIAGNOSIS — M9901 Segmental and somatic dysfunction of cervical region: Secondary | ICD-10-CM | POA: Diagnosis not present

## 2020-03-09 DIAGNOSIS — M62830 Muscle spasm of back: Secondary | ICD-10-CM | POA: Diagnosis not present

## 2020-03-09 DIAGNOSIS — M9903 Segmental and somatic dysfunction of lumbar region: Secondary | ICD-10-CM | POA: Diagnosis not present

## 2020-03-09 DIAGNOSIS — M545 Low back pain: Secondary | ICD-10-CM | POA: Diagnosis not present

## 2020-03-10 DIAGNOSIS — M545 Low back pain: Secondary | ICD-10-CM | POA: Diagnosis not present

## 2020-03-10 DIAGNOSIS — M9901 Segmental and somatic dysfunction of cervical region: Secondary | ICD-10-CM | POA: Diagnosis not present

## 2020-03-10 DIAGNOSIS — M9903 Segmental and somatic dysfunction of lumbar region: Secondary | ICD-10-CM | POA: Diagnosis not present

## 2020-03-10 DIAGNOSIS — M62830 Muscle spasm of back: Secondary | ICD-10-CM | POA: Diagnosis not present

## 2020-03-14 DIAGNOSIS — M9903 Segmental and somatic dysfunction of lumbar region: Secondary | ICD-10-CM | POA: Diagnosis not present

## 2020-03-14 DIAGNOSIS — M9901 Segmental and somatic dysfunction of cervical region: Secondary | ICD-10-CM | POA: Diagnosis not present

## 2020-03-14 DIAGNOSIS — M62830 Muscle spasm of back: Secondary | ICD-10-CM | POA: Diagnosis not present

## 2020-03-14 DIAGNOSIS — M545 Low back pain: Secondary | ICD-10-CM | POA: Diagnosis not present

## 2020-03-17 DIAGNOSIS — M9901 Segmental and somatic dysfunction of cervical region: Secondary | ICD-10-CM | POA: Diagnosis not present

## 2020-03-17 DIAGNOSIS — M545 Low back pain: Secondary | ICD-10-CM | POA: Diagnosis not present

## 2020-03-17 DIAGNOSIS — M9903 Segmental and somatic dysfunction of lumbar region: Secondary | ICD-10-CM | POA: Diagnosis not present

## 2020-03-17 DIAGNOSIS — M62830 Muscle spasm of back: Secondary | ICD-10-CM | POA: Diagnosis not present

## 2020-03-21 DIAGNOSIS — M62830 Muscle spasm of back: Secondary | ICD-10-CM | POA: Diagnosis not present

## 2020-03-21 DIAGNOSIS — M9901 Segmental and somatic dysfunction of cervical region: Secondary | ICD-10-CM | POA: Diagnosis not present

## 2020-03-21 DIAGNOSIS — M545 Low back pain: Secondary | ICD-10-CM | POA: Diagnosis not present

## 2020-03-21 DIAGNOSIS — M9903 Segmental and somatic dysfunction of lumbar region: Secondary | ICD-10-CM | POA: Diagnosis not present

## 2020-03-23 DIAGNOSIS — M62830 Muscle spasm of back: Secondary | ICD-10-CM | POA: Diagnosis not present

## 2020-03-23 DIAGNOSIS — M9903 Segmental and somatic dysfunction of lumbar region: Secondary | ICD-10-CM | POA: Diagnosis not present

## 2020-03-23 DIAGNOSIS — M545 Low back pain: Secondary | ICD-10-CM | POA: Diagnosis not present

## 2020-03-23 DIAGNOSIS — M9901 Segmental and somatic dysfunction of cervical region: Secondary | ICD-10-CM | POA: Diagnosis not present

## 2020-03-28 DIAGNOSIS — M545 Low back pain: Secondary | ICD-10-CM | POA: Diagnosis not present

## 2020-03-28 DIAGNOSIS — M62838 Other muscle spasm: Secondary | ICD-10-CM | POA: Diagnosis not present

## 2020-03-28 DIAGNOSIS — M9903 Segmental and somatic dysfunction of lumbar region: Secondary | ICD-10-CM | POA: Diagnosis not present

## 2020-03-28 DIAGNOSIS — M9901 Segmental and somatic dysfunction of cervical region: Secondary | ICD-10-CM | POA: Diagnosis not present

## 2020-03-28 DIAGNOSIS — M62830 Muscle spasm of back: Secondary | ICD-10-CM | POA: Diagnosis not present

## 2020-04-07 DIAGNOSIS — M545 Low back pain: Secondary | ICD-10-CM | POA: Diagnosis not present

## 2020-04-07 DIAGNOSIS — M62830 Muscle spasm of back: Secondary | ICD-10-CM | POA: Diagnosis not present

## 2020-04-07 DIAGNOSIS — M62838 Other muscle spasm: Secondary | ICD-10-CM | POA: Diagnosis not present

## 2020-04-07 DIAGNOSIS — M9901 Segmental and somatic dysfunction of cervical region: Secondary | ICD-10-CM | POA: Diagnosis not present

## 2020-04-07 DIAGNOSIS — M9903 Segmental and somatic dysfunction of lumbar region: Secondary | ICD-10-CM | POA: Diagnosis not present

## 2020-05-11 ENCOUNTER — Other Ambulatory Visit: Payer: Self-pay

## 2020-05-11 ENCOUNTER — Encounter: Payer: Self-pay | Admitting: Psychiatry

## 2020-05-11 ENCOUNTER — Ambulatory Visit (INDEPENDENT_AMBULATORY_CARE_PROVIDER_SITE_OTHER): Payer: BC Managed Care – PPO | Admitting: Psychiatry

## 2020-05-11 DIAGNOSIS — F419 Anxiety disorder, unspecified: Secondary | ICD-10-CM | POA: Diagnosis not present

## 2020-05-11 DIAGNOSIS — F900 Attention-deficit hyperactivity disorder, predominantly inattentive type: Secondary | ICD-10-CM | POA: Diagnosis not present

## 2020-05-11 MED ORDER — METHYLPHENIDATE HCL 10 MG PO TABS: 10.0000 mg | ORAL_TABLET | Freq: Every day | ORAL | 0 refills | Status: DC | PRN

## 2020-05-11 MED ORDER — METHYLPHENIDATE HCL ER (OSM) 36 MG PO TBCR: 36.0000 mg | EXTENDED_RELEASE_TABLET | Freq: Every day | ORAL | 0 refills | Status: DC

## 2020-05-11 MED ORDER — METHYLPHENIDATE HCL 10 MG PO TABS: 10.0000 mg | ORAL_TABLET | Freq: Every day | ORAL | 0 refills | Status: DC

## 2020-05-11 MED ORDER — PROPRANOLOL HCL 10 MG PO TABS: 10.0000 mg | ORAL_TABLET | Freq: Three times a day (TID) | ORAL | 2 refills | Status: DC

## 2020-05-11 NOTE — Progress Notes (Signed)
Timothy Camacho [id number] Mar 01, 1961 59 y.o.  Subjective:   Patient ID:  Timothy Camacho is a 59 y.o. (DOB 20-Oct-1961) male.  Chief Complaint:  Chief Complaint  Patient presents with  . Anxiety  . ADD  . Follow-up    h/o depression    HPI Timothy Camacho presents to the office today for follow-up of ADD and depression. 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 He reports that he started feeling nauseous, sweaty, and had some vertigo. Reports that he experienced a feeling of "entrapment." He reports that he slept until 2 pm the next day. He reports that he checked his BP the next day and BP was 150/100.  He reports at times he is quicker to anger/ feels more defensive. He reports that he continued not to feel well for several days after this incident. He reports that he felt like he was waiting for something else to happen. He reports that he has been having unusual dreams, when he usually does not have dreams. He reports that he has been sleeping at night. He reports that he is not sure if his mood is sad. Reports feeling "defeated." He reports that he is not experiencing significant enjoyment. Concentration has been better with ritalin versus Concerta. He reports that response seems to last a couple of hours. Appetite has been good and reports wt gain. Energy and motivation have been ok once he gets started. He reports that he started taking Propranolol 10 mg TID last week. He reports that he has been feeling "ok" and that he has not had anymore vertigo.   He has been gritting his teeth. He reports that when he awakens he feels as if his jaw has been locked.   Denies SI.   He reports some job stress and frustration at work.    Past Medication Trials: Lexapro Celexa Wellbutrin  XL Olanzapine Risperdal Trileptal Lamictal Gabapentin Topamax Naltrexone Klonopin NAC Concerta Adhansia- intrusive thoughts, grinding teeth Methylphenidate Propranolol  Review of Systems:  Review of Systems  Musculoskeletal: Positive for neck pain. Negative for gait problem.  Neurological: Negative for tremors.  Psychiatric/Behavioral:       Please refer to HPI    Medications: I have reviewed the patient's current medications.  Current Outpatient Medications  Medication Sig Dispense Refill  . acetaminophen (TYLENOL) 500 MG tablet Take 500 mg by mouth every 6 (six) hours as needed (pain).    Marland Kitchen allopurinol (ZYLOPRIM) 300 MG tablet Take 300 mg by mouth daily.    . Cholecalciferol (VITAMIN D) 2000 units CAPS Take by mouth.    . Cyanocobalamin (VITAMIN B-12) 1000 MCG/15ML LIQD Take by mouth.    . escitalopram (LEXAPRO) 20 MG tablet Take 1 tablet (20 mg total) by mouth daily. 90 tablet 1  . ibuprofen (ADVIL,MOTRIN) 200 MG tablet Take 800 mg by mouth every 6 (six) hours as needed.    Marland Kitchen MAGNESIUM PO Take by mouth.    Melene Muller ON 08/03/2020] methylphenidate (CONCERTA) 36 MG PO CR tablet Take 1 tablet (36 mg total) by mouth daily. 30 tablet 0  . [START ON 07/06/2020] methylphenidate (CONCERTA) 36 MG PO CR tablet Take 1 tablet (36 mg total) by mouth daily. 30 tablet 0  . [START ON 06/08/2020] methylphenidate (CONCERTA) 36 MG PO CR tablet Take 1 tablet (36 mg total) by mouth daily. 30 tablet 0  . [  START ON 07/06/2020] methylphenidate (RITALIN) 10 MG tablet Take 1 tablet (10 mg total) by mouth daily as needed. 30 tablet 0  . [START ON 06/08/2020] methylphenidate (RITALIN) 10 MG tablet Take 1 tablet (10 mg total) by mouth daily as needed. 30 tablet 0  . methylphenidate (RITALIN) 10 MG tablet Take 1 tablet (10 mg total) by mouth daily. 30 tablet 0  . Multiple Vitamin (MULTIVITAMIN WITH MINERALS) TABS tablet Take 1 tablet by mouth daily.    Marland Kitchen POTASSIUM PO Take by mouth.    . pramipexole  (MIRAPEX) 0.5 MG tablet Take 1.5 tablets (0.75 mg total) by mouth every evening. 135 tablet 1  . propranolol (INDERAL) 10 MG tablet Take 1 tablet (10 mg total) by mouth 3 (three) times daily. 90 tablet 2  . Pyridoxine HCl (VITAMIN B-6 PO) Take by mouth.    . THEANINE PO Take by mouth.     No current facility-administered medications for this visit.    Medication Side Effects: None  Allergies: No Known Allergies  Past Medical History:  Diagnosis Date  . Gout   . History of peritonsillar abscess   . RLS (restless legs syndrome)     Family History  Problem Relation Age of Onset  . Cancer Mother        breast  . Diabetes Mother   . Alcohol abuse Father   . CVA Maternal Aunt   . Suicidality Maternal Uncle   . Alcohol abuse Paternal Uncle     Social History   Socioeconomic History  . Marital status: Married    Spouse name: Not on file  . Number of children: Not on file  . Years of education: Not on file  . Highest education level: Not on file  Occupational History  . Not on file  Tobacco Use  . Smoking status: Former Games developer  . Smokeless tobacco: Never Used  Substance and Sexual Activity  . Alcohol use: Yes    Comment: every other day   . Drug use: No  . Sexual activity: Not on file  Other Topics Concern  . Not on file  Social History Narrative  . Not on file   Social Determinants of Health   Financial Resource Strain:   . Difficulty of Paying Living Expenses:   Food Insecurity:   . Worried About Programme researcher, broadcasting/film/video in the Last Year:   . Barista in the Last Year:   Transportation Needs:   . Freight forwarder (Medical):   Marland Kitchen Lack of Transportation (Non-Medical):   Physical Activity:   . Days of Exercise per Week:   . Minutes of Exercise per Session:   Stress:   . Feeling of Stress :   Social Connections:   . Frequency of Communication with Friends and Family:   . Frequency of Social Gatherings with Friends and Family:   . Attends Religious  Services:   . Active Member of Clubs or Organizations:   . Attends Banker Meetings:   Marland Kitchen Marital Status:   Intimate Partner Violence:   . Fear of Current or Ex-Partner:   . Emotionally Abused:   Marland Kitchen Physically Abused:   . Sexually Abused:     Past Medical History, Surgical history, Social history, and Family history were reviewed and updated as appropriate.   Please see review of systems for further details on the patient's review from today.   Objective:   Physical Exam:  BP 125/88   Pulse 65  Physical Exam Constitutional:      General: He is not in acute distress. Musculoskeletal:        General: No deformity.  Neurological:     Mental Status: He is alert and oriented to person, place, and time.     Coordination: Coordination normal.  Psychiatric:        Attention and Perception: Attention and perception normal. He does not perceive auditory or visual hallucinations.        Mood and Affect: Mood normal. Mood is not anxious or depressed. Affect is not labile, blunt, angry or inappropriate.        Speech: Speech normal.        Behavior: Behavior normal.        Thought Content: Thought content normal. Thought content is not paranoid or delusional. Thought content does not include homicidal or suicidal ideation. Thought content does not include homicidal or suicidal plan.        Cognition and Memory: Cognition and memory normal.        Judgment: Judgment normal.     Comments: Insight intact     Lab Review:     Component Value Date/Time   CREATININE 1.09 12/06/2013 0119   GFRNONAA 77 (L) 12/06/2013 0119   GFRAA 89 (L) 12/06/2013 0119       Component Value Date/Time   WBC 12.2 (H) 12/07/2013 0348   RBC 3.61 (L) 12/07/2013 0348   HGB 11.5 (L) 12/07/2013 0348   HCT 34.7 (L) 12/07/2013 0348   PLT 198 12/07/2013 0348   MCV 96.1 12/07/2013 0348   MCH 31.9 12/07/2013 0348   MCHC 33.1 12/07/2013 0348   RDW 14.0 12/07/2013 0348   LYMPHSABS 2.2 12/07/2013  0348   MONOABS 1.4 (H) 12/07/2013 0348   EOSABS 0.2 12/07/2013 0348   BASOSABS 0.0 12/07/2013 0348    No results found for: POCLITH, LITHIUM   No results found for: PHENYTOIN, PHENOBARB, VALPROATE, CBMZ   .res Assessment: Plan:   Pt seen for 30 minutes and discussed recent episode of increased anxiety and BP.  Agreed that taking Propranolol three times daily would likely be helpful for controlling anxiety and BP. Discussed that Propranolol typically has a duration of about 4-6 hours and taking TID would allow for coverage throughout the day and that BP is normal today. Continue Propranolol 10 mg po TID. Pt reports that he does not need a refill at this time. Continue Concerta 36 mg po qd for ADD.  Continue Ritalin 10 mg po qd prn ADD. Continue Lexapro 20 mg po qd for anxiety and mood s/s.  Pt to f/u in 3 months or sooner if clinically indicated.  Patient advised to contact office with any questions, adverse effects, or acute worsening in signs and symptoms.  Yakir was seen today for anxiety, add and follow-up.  Diagnoses and all orders for this visit:  Anxiety disorder, unspecified type -     propranolol (INDERAL) 10 MG tablet; Take 1 tablet (10 mg total) by mouth 3 (three) times daily.  Attention deficit hyperactivity disorder (ADHD), predominantly inattentive type -     methylphenidate (CONCERTA) 36 MG PO CR tablet; Take 1 tablet (36 mg total) by mouth daily. -     methylphenidate (CONCERTA) 36 MG PO CR tablet; Take 1 tablet (36 mg total) by mouth daily. -     methylphenidate (CONCERTA) 36 MG PO CR tablet; Take 1 tablet (36 mg total) by mouth daily. -     methylphenidate (RITALIN) 10  MG tablet; Take 1 tablet (10 mg total) by mouth daily as needed. -     methylphenidate (RITALIN) 10 MG tablet; Take 1 tablet (10 mg total) by mouth daily as needed. -     methylphenidate (RITALIN) 10 MG tablet; Take 1 tablet (10 mg total) by mouth daily.     Please see After Visit Summary for  patient specific instructions.  Future Appointments  Date Time Provider Department Center  08/11/2020 11:45 AM Corie Chiquito, PMHNP [facility] None    No orders of the defined types were placed in this encounter.   -------------------------------

## 2020-05-22 ENCOUNTER — Other Ambulatory Visit: Payer: Self-pay | Admitting: Psychiatry

## 2020-05-22 DIAGNOSIS — F419 Anxiety disorder, unspecified: Secondary | ICD-10-CM

## 2020-06-07 ENCOUNTER — Other Ambulatory Visit: Payer: Self-pay

## 2020-06-07 ENCOUNTER — Encounter: Payer: Self-pay | Admitting: Cardiovascular Disease

## 2020-06-07 ENCOUNTER — Ambulatory Visit: Payer: BC Managed Care – PPO | Admitting: Cardiovascular Disease

## 2020-06-07 VITALS — BP 120/84 | HR 58 | Ht 70.0 in | Wt 245.4 lb

## 2020-06-07 DIAGNOSIS — R0602 Shortness of breath: Secondary | ICD-10-CM

## 2020-06-07 DIAGNOSIS — M7989 Other specified soft tissue disorders: Secondary | ICD-10-CM | POA: Diagnosis not present

## 2020-06-07 DIAGNOSIS — F419 Anxiety disorder, unspecified: Secondary | ICD-10-CM | POA: Diagnosis not present

## 2020-06-07 DIAGNOSIS — I10 Essential (primary) hypertension: Secondary | ICD-10-CM | POA: Diagnosis not present

## 2020-06-07 MED ORDER — HYDROCHLOROTHIAZIDE 25 MG PO TABS
25.0000 mg | ORAL_TABLET | Freq: Every day | ORAL | 11 refills | Status: DC
Start: 2020-06-07 — End: 2022-03-22

## 2020-06-07 MED ORDER — POTASSIUM CHLORIDE ER 10 MEQ PO TBCR
10.0000 meq | EXTENDED_RELEASE_TABLET | Freq: Every day | ORAL | 11 refills | Status: AC
Start: 2020-06-07 — End: ?

## 2020-06-07 MED ORDER — PROPRANOLOL HCL 10 MG PO TABS: 10.0000 mg | ORAL_TABLET | Freq: Four times a day (QID) | ORAL | 6 refills | Status: DC | PRN

## 2020-06-07 NOTE — Patient Instructions (Addendum)
Medication Instructions:  Your physician has recommended you make the following change in your medication:  START HCTZ (Hydrochlorothiazide) 25 mg one daily START Kdur (potassium chloride) 10 mEq once daily CHANGE Propranolol (Inderal) to 10 mg up to 4 times per day only as needed for palpitations or fast heart rate  *If you need a refill on your cardiac medications before your next appointment, please call your pharmacy*   Lab Work: Your physician recommends that you return for lab work in: 3 weeks on July 13. You may come in anytime between 7:30 am and 4:45 pm. You do not have to fast for this appointment.  If you have labs (blood work) drawn today and your tests are completely normal, you will receive your results only by: Marland Kitchen MyChart Message (if you have MyChart) OR . A paper copy in the mail If you have any lab test that is abnormal or we need to change your treatment, we will call you to review the results.   Testing/Procedures: None Ordered   Follow-Up: At [facility], you and your health needs are our priority.  As part of our continuing mission to provide you with exceptional heart care, we have created designated Provider Care Teams.  These Care Teams include your primary Cardiologist (physician) and Advanced Practice Providers (APPs -  Physician Assistants and Nurse Practitioners) who all work together to provide you with the care you need, when you need it.   Your next appointment:   3 month(s) on Friday Sept. 24 at 4:20 pm  The format for your next appointment:   In Person  Provider:   Kristeen Miss, MD

## 2020-06-07 NOTE — Progress Notes (Signed)
Cardiology Office Note:    Date:  06/07/2020   ID:  Timothy Camacho, DOB December 25, 1960, MRN [id number]  PCP:  Rodrigo Ran, MD  [facility] Cardiologist:  [facility] Electrophysiologist:  None   Referring MD: Rodrigo Ran, MD   Chief Complaint  Patient presents with  . Leg Swelling    History of Present Illness:    Timothy Camacho is a 59 y.o. male with a hx of palpitations and chest pain ,  Hx of peritonsillar  abscess.  I saw him many years ago . Echo showed normal left ventricular systolic function.    there is no evidence of endocarditis.  For the past several months , He thinks he likely had Covid last year.   Had persistent cough,   Dr. Delena Serve thought it might be viral induced asthma. Has gained weight  Has been under more stress recently  His in now working with [employer]  2 months ago - lots of stress ( new GM at work ) developed high BP .  More DOE. DOE walking hills  Has gained 28 lbs over the past year  This past weekend, he went to [place],  Got dehydrated at the drag races.   Has felt felt poorly since that time .  Is not getting any regular exercise  Has not had covid vaccine.    Has not changed his diet much .  Drinks lots of water  Eats breakfast at [employer] ,  [employer] weekly,  Pizza weekly   His leg edema has improved this past .   Legs still hurt.   Taking propranolol 10 mg BID scheduled    Past Medical History:  Diagnosis Date  . Gout   . History of peritonsillar abscess   . RLS (restless legs syndrome)     Past Surgical History:  Procedure Laterality Date  . APPENDECTOMY    . DIRECT LARYNGOSCOPY N/A 12/05/2013   Procedure: DIRECT LARYNGOSCOPY;  Surgeon: Flo Shanks, MD;  Location: [facility];  Service: ENT;  Laterality: N/A;  . TONSILLECTOMY Right 12/05/2013   Procedure: TONSILLECTOMY;  Surgeon: Flo Shanks, MD;  Location: [facility];  Service: ENT;  Laterality: Right;  . TRACHEOSTOMY TUBE PLACEMENT N/A 12/05/2013    Procedure: TRACHEOSTOMY;  Surgeon: Flo Shanks, MD;  Location: [facility];  Service: ENT;  Laterality: N/A;    Current Medications: Current Meds  Medication Sig  . acetaminophen (TYLENOL) 500 MG tablet Take 500 mg by mouth every 6 (six) hours as needed (pain).  Marland Kitchen allopurinol (ZYLOPRIM) 300 MG tablet Take 300 mg by mouth daily.  . Cholecalciferol (VITAMIN D) 2000 units CAPS Take by mouth.  . Cyanocobalamin (VITAMIN B-12) 1000 MCG/15ML LIQD Take by mouth.  . escitalopram (LEXAPRO) 20 MG tablet Take 1 tablet (20 mg total) by mouth daily.  . fluticasone (FLONASE) 50 MCG/ACT nasal spray Place 1 spray into both nostrils as needed.  Marland Kitchen ibuprofen (ADVIL,MOTRIN) 200 MG tablet Take 800 mg by mouth every 6 (six) hours as needed.  Marland Kitchen MAGNESIUM PO Take by mouth.  Melene Muller ON 08/03/2020] methylphenidate (CONCERTA) 36 MG PO CR tablet Take 1 tablet (36 mg total) by mouth daily.  Melene Muller ON 07/06/2020] methylphenidate (RITALIN) 10 MG tablet Take 1 tablet (10 mg total) by mouth daily as needed.  . Multiple Vitamin (MULTIVITAMIN WITH MINERALS) TABS tablet Take 1 tablet by mouth daily.  Marland Kitchen POTASSIUM PO Take by mouth.  . pramipexole (MIRAPEX) 0.5 MG tablet Take 1.5 tablets (  0.75 mg total) by mouth every evening.  . propranolol (INDERAL) 10 MG tablet Take 1 tablet (10 mg total) by mouth 4 (four) times daily as needed (palpitations or fast heart rate).  . Pyridoxine HCl (VITAMIN B-6 PO) Take by mouth.  . THEANINE PO Take by mouth.  . [DISCONTINUED] propranolol (INDERAL) 10 MG tablet TAKE 1 TO 2 TABLETS BY MOUTH TWICE A DAY AS NEEDED FOR ANXIETY     Allergies:   Patient has no known allergies.   Social History   Socioeconomic History  . Marital status: Married    Spouse name: Not on file  . Number of children: Not on file  . Years of education: Not on file  . Highest education level: Not on file  Occupational History  . Not on file  Tobacco Use  . Smoking status: Former Research scientist (life sciences)  . Smokeless tobacco: Never  Used  Vaping Use  . Vaping Use: Never used  Substance and Sexual Activity  . Alcohol use: Yes    Comment: every other day   . Drug use: No  . Sexual activity: Not on file  Other Topics Concern  . Not on file  Social History Narrative  . Not on file   Social Determinants of Health   Financial Resource Strain:   . Difficulty of Paying Living Expenses:   Food Insecurity:   . Worried About Charity fundraiser in the Last Year:   . Arboriculturist in the Last Year:   Transportation Needs:   . Film/video editor (Medical):   Marland Kitchen Lack of Transportation (Non-Medical):   Physical Activity:   . Days of Exercise per Week:   . Minutes of Exercise per Session:   Stress:   . Feeling of Stress :   Social Connections:   . Frequency of Communication with Friends and Family:   . Frequency of Social Gatherings with Friends and Family:   . Attends Religious Services:   . Active Member of Clubs or Organizations:   . Attends Archivist Meetings:   Marland Kitchen Marital Status:      Family History: The patient's family history includes Alcohol abuse in his father and paternal uncle; CVA in his maternal aunt; Cancer in his mother; Diabetes in his mother; Suicidality in his maternal uncle.  ROS:   Please see the history of present illness.     All other systems reviewed and are negative.  EKGs/Labs/Other Studies Reviewed:    The following studies were reviewed today:   EKG: Sinus bradycardia 58 beats minute.  No ST or T wave changes.  Recent Labs: No results found for requested labs within last 8760 hours.  Recent Lipid Panel No results found for: CHOL, TRIG, HDL, CHOLHDL, VLDL, LDLCALC, LDLDIRECT  Physical Exam:    VS:  BP 120/84   Pulse (!) 58   Ht 5\' 10"  (1.778 m)   Wt 245 lb 6.4 oz (111.3 kg)   SpO2 96%   BMI 35.21 kg/m     Wt Readings from Last 3 Encounters:  06/07/20 245 lb 6.4 oz (111.3 kg)  01/02/19 235 lb (106.6 kg)  08/19/18 233 lb (105.7 kg)     GEN:   Well  nourished, well developed in no acute distress.  moderatley obese  HEENT: Normal NECK: No JVD; No carotid bruits LYMPHATICS: No lymphadenopathy CARDIAC: RRR, no murmurs, rubs, gallops RESPIRATORY:  Clear to auscultation without rales, wheezing or rhonchi  ABDOMEN: Soft, non-tender, non-distended MUSCULOSKELETAL:  No edema;  No deformity  SKIN: Warm and dry NEUROLOGIC:  Alert and oriented x 3 PSYCHIATRIC:  Normal affect   ASSESSMENT:    1. Leg swelling   2. Anxiety disorder, unspecified type   3. Essential hypertension   4. SOB (shortness of breath)    PLAN:    In order of problems listed above:  1. Essential hypertension: Timothy Camacho presents with mild hypertension.  He has started taking his Inderal twice a day as scheduled medication.  He has been eating a lot of salt and takeout food.  I encouraged him to work on a better diet, exercise, weight loss program.  We will start him on HCTZ 25 mg a day and potassium chloride 10 mEq a day.  Check basic metabolic profile in 3 weeks.  2.  Fatigue: He is having lots of generalized fatigue and shortness of breath with exertion.  We will check a CBC, TSH.  Check basic metabolic profile and liver enzymes. Some of this might be due to weight gain.  We had a long discussion about improving his diet and weight loss.  I will see him back in 3 months.   Medication Adjustments/Labs and Tests Ordered: Current medicines are reviewed at length with the patient today.  Concerns regarding medicines are outlined above.  Orders Placed This Encounter  Procedures  . CBC  . Basic Metabolic Panel (BMET)  . TSH  . Hepatic function panel  . Basic Metabolic Panel (BMET)  . EKG 12-Lead   Meds ordered this encounter  Medications  . hydrochlorothiazide (HYDRODIURIL) 25 MG tablet    Sig: Take 1 tablet (25 mg total) by mouth daily.    Dispense:  30 tablet    Refill:  11  . potassium chloride (KLOR-CON) 10 MEQ tablet    Sig: Take 1 tablet (10 mEq total) by  mouth daily.    Dispense:  30 tablet    Refill:  11  . propranolol (INDERAL) 10 MG tablet    Sig: Take 1 tablet (10 mg total) by mouth 4 (four) times daily as needed (palpitations or fast heart rate).    Dispense:  60 tablet    Refill:  6    Patient Instructions  Medication Instructions:  Your physician has recommended you make the following change in your medication:  START HCTZ (Hydrochlorothiazide) 25 mg one daily START Kdur (potassium chloride) 10 mEq once daily CHANGE Propranolol (Inderal) to 10 mg up to 4 times per day only as needed for palpitations or fast heart rate  *If you need a refill on your cardiac medications before your next appointment, please call your pharmacy*   Lab Work: Your physician recommends that you return for lab work in: 3 weeks on July 13. You may come in anytime between 7:30 am and 4:45 pm. You do not have to fast for this appointment.  If you have labs (blood work) drawn today and your tests are completely normal, you will receive your results only by: Marland Kitchen MyChart Message (if you have MyChart) OR . A paper copy in the mail If you have any lab test that is abnormal or we need to change your treatment, we will call you to review the results.   Testing/Procedures: None Ordered   Follow-Up: At [facility], you and your health needs are our priority.  As part of our continuing mission to provide you with exceptional heart care, we have created designated Provider Care Teams.  These Care Teams include your primary Cardiologist (physician) and Advanced  Practice Providers (APPs -  Physician Assistants and Nurse Practitioners) who all work together to provide you with the care you need, when you need it.   Your next appointment:   3 month(s) on Friday Sept. 24 at 4:20 pm  The format for your next appointment:   In Person  Provider:   Kristeen Miss, MD       Signed, Kristeen Miss, MD  06/07/2020 6:28 PM    [facility]  [facility]

## 2020-06-08 LAB — CBC
Hematocrit: 42.8 % (ref 37.5–51.0)
Hemoglobin: 14.7 g/dL (ref 13.0–17.7)
MCH: 31.7 pg (ref 26.6–33.0)
MCHC: 34.3 g/dL (ref 31.5–35.7)
MCV: 92 fL (ref 79–97)
Platelets: 265 10*3/uL (ref 150–450)
RBC: 4.63 x10E6/uL (ref 4.14–5.80)
RDW: 13.4 % (ref 11.6–15.4)
WBC: 8.1 10*3/uL (ref 3.4–10.8)

## 2020-06-08 LAB — BASIC METABOLIC PANEL
BUN/Creatinine Ratio: 19 (ref 9–20)
BUN: 17 mg/dL (ref 6–24)
CO2: 23 mmol/L (ref 20–29)
Calcium: 9.8 mg/dL (ref 8.7–10.2)
Chloride: 102 mmol/L (ref 96–106)
Creatinine, Ser: 0.89 mg/dL (ref 0.76–1.27)
GFR calc Af Amer: 110 mL/min/{1.73_m2} (ref >59)
GFR calc non Af Amer: 95 mL/min/{1.73_m2} (ref >59)
Glucose: 94 mg/dL (ref 65–99)
Potassium: 4.8 mmol/L (ref 3.5–5.2)
Sodium: 138 mmol/L (ref 134–144)

## 2020-06-08 LAB — HEPATIC FUNCTION PANEL
ALT: 26 IU/L (ref 0–44)
AST: 29 IU/L (ref 0–40)
Albumin: 4.5 g/dL (ref 3.8–4.9)
Alkaline Phosphatase: 61 IU/L (ref 48–121)
Bilirubin Total: 0.3 mg/dL (ref 0.0–1.2)
Bilirubin, Direct: 0.11 mg/dL (ref 0.00–0.40)
Total Protein: 7.1 g/dL (ref 6.0–8.5)

## 2020-06-08 LAB — TSH: TSH: 2.23 u[IU]/mL (ref 0.450–4.500)

## 2020-06-28 ENCOUNTER — Other Ambulatory Visit: Payer: Self-pay

## 2020-06-28 ENCOUNTER — Other Ambulatory Visit: Payer: BC Managed Care – PPO

## 2020-06-28 DIAGNOSIS — F419 Anxiety disorder, unspecified: Secondary | ICD-10-CM | POA: Diagnosis not present

## 2020-06-28 DIAGNOSIS — M7989 Other specified soft tissue disorders: Secondary | ICD-10-CM | POA: Diagnosis not present

## 2020-06-28 DIAGNOSIS — I10 Essential (primary) hypertension: Secondary | ICD-10-CM

## 2020-06-28 DIAGNOSIS — R0602 Shortness of breath: Secondary | ICD-10-CM | POA: Diagnosis not present

## 2020-06-29 LAB — BASIC METABOLIC PANEL
BUN/Creatinine Ratio: 15 (ref 9–20)
BUN: 15 mg/dL (ref 6–24)
CO2: 22 mmol/L (ref 20–29)
Calcium: 9.1 mg/dL (ref 8.7–10.2)
Chloride: 106 mmol/L (ref 96–106)
Creatinine, Ser: 1.03 mg/dL (ref 0.76–1.27)
GFR calc Af Amer: 93 mL/min/{1.73_m2} (ref >59)
GFR calc non Af Amer: 80 mL/min/{1.73_m2} (ref >59)
Glucose: 131 mg/dL — ABNORMAL HIGH (ref 65–99)
Potassium: 4.2 mmol/L (ref 3.5–5.2)
Sodium: 143 mmol/L (ref 134–144)

## 2020-07-25 ENCOUNTER — Other Ambulatory Visit: Payer: Self-pay | Admitting: Psychiatry

## 2020-07-25 DIAGNOSIS — G2581 Restless legs syndrome: Secondary | ICD-10-CM

## 2020-08-11 ENCOUNTER — Ambulatory Visit (INDEPENDENT_AMBULATORY_CARE_PROVIDER_SITE_OTHER): Payer: BC Managed Care – PPO | Admitting: Psychiatry

## 2020-08-11 ENCOUNTER — Encounter: Payer: Self-pay | Admitting: Psychiatry

## 2020-08-11 ENCOUNTER — Other Ambulatory Visit: Payer: Self-pay

## 2020-08-11 DIAGNOSIS — F900 Attention-deficit hyperactivity disorder, predominantly inattentive type: Secondary | ICD-10-CM | POA: Diagnosis not present

## 2020-08-11 DIAGNOSIS — G2581 Restless legs syndrome: Secondary | ICD-10-CM | POA: Diagnosis not present

## 2020-08-11 DIAGNOSIS — F419 Anxiety disorder, unspecified: Secondary | ICD-10-CM

## 2020-08-11 DIAGNOSIS — F39 Unspecified mood [affective] disorder: Secondary | ICD-10-CM

## 2020-08-11 MED ORDER — METHYLPHENIDATE HCL 10 MG PO TABS: 10.0000 mg | ORAL_TABLET | Freq: Every day | ORAL | 0 refills | Status: DC

## 2020-08-11 MED ORDER — METHYLPHENIDATE HCL ER (OSM) 36 MG PO TBCR: 36.0000 mg | EXTENDED_RELEASE_TABLET | Freq: Every day | ORAL | 0 refills | Status: DC

## 2020-08-11 MED ORDER — METHYLPHENIDATE HCL 10 MG PO TABS: 10.0000 mg | ORAL_TABLET | Freq: Every day | ORAL | 0 refills | Status: DC | PRN

## 2020-08-11 MED ORDER — PRAMIPEXOLE DIHYDROCHLORIDE 0.5 MG PO TABS: ORAL_TABLET | ORAL | 1 refills | Status: DC

## 2020-08-11 MED ORDER — ESCITALOPRAM OXALATE 10 MG PO TABS: 10.0000 mg | ORAL_TABLET | Freq: Every day | ORAL | 1 refills | Status: DC

## 2020-08-11 NOTE — Progress Notes (Signed)
Timothy Camacho [id number] 04/06/1961 59 y.o.  Subjective:   Patient ID:  Timothy LabellaDonald G Camacho is a 59 y.o. (DOB 04/06/1961) male.  Chief Complaint:  Chief Complaint  Patient presents with  . Follow-up    ADD    HPI Timothy Camacho presents to the office today for follow-up of ADD. 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 He decreased Lexapro to 10 mg due to leg cramps around mid-July. Denies any change in mood and anxiety. Sleep has been ok with intermittent awakening. Appetite has been ok.  Has been enjoying motor sports. He reports motivation is fair. Energy is fair. Concentration is ok with methylphenidate. Occasionally will take immediate release methylphenidate upon awakening and then take Concerta later in the day. He reports that he feels calmer after taking Ritalin. Denies SI.   He reports that work has been going ok. Reports that shop has been busy. New grandson born a few weeks ago.   Past Medication Trials: Lexapro Celexa Wellbutrin XL Olanzapine Risperdal Trileptal Lamictal Gabapentin Topamax Naltrexone Klonopin NAC Concerta Adhansia- intrusive thoughts, grinding teeth Methylphenidate Propranolol  Review of Systems:  Review of Systems  Cardiovascular: Positive for palpitations and leg swelling.       Reports that palpitations occur with anxiety  Musculoskeletal: Negative for gait problem.  Skin:       Reports changes in fingernails  Neurological: Negative for tremors.  Psychiatric/Behavioral:       Please refer to HPI    Had LE edema after prolonged standing. Started HCTZ and Potassium and then started having leg cramps. He then reduced Lexapro and cramping resolved.   Medications: I have reviewed the patient's current medications.  Current Outpatient Medications  Medication Sig Dispense Refill  . acetaminophen (TYLENOL) 500 MG tablet Take 500 mg by mouth every 6 (six) hours as needed (pain).    Marland Kitchen. allopurinol (ZYLOPRIM) 300 MG tablet Take  300 mg by mouth daily.    . Cholecalciferol (VITAMIN D) 2000 units CAPS Take by mouth.    Marland Kitchen. FEXOFENADINE HCL PO Take by mouth.    Melene Muller. [START ON 08/20/2020] methylphenidate (CONCERTA) 36 MG PO CR tablet Take 1 tablet (36 mg total) by mouth daily. 30 tablet 0  . [START ON 08/20/2020] methylphenidate (RITALIN) 10 MG tablet Take 1 tablet (10 mg total) by mouth daily as needed. 30 tablet 0  . potassium chloride (KLOR-CON) 10 MEQ tablet Take 1 tablet (10 mEq total) by mouth daily. 30 tablet 11  . POTASSIUM PO Take by mouth.    . propranolol (INDERAL) 10 MG tablet Take 1 tablet (10 mg total) by mouth 4 (four) times daily as needed (palpitations or fast heart rate). 60 tablet 6  . Cyanocobalamin (VITAMIN B-12) 1000 MCG/15ML LIQD Take by mouth. (Patient not taking: Reported on 08/11/2020)    . escitalopram (LEXAPRO) 10 MG tablet Take 1 tablet (10 mg total) by mouth daily. 90 tablet 1  . fluticasone (FLONASE) 50 MCG/ACT nasal spray Place 1 spray into both nostrils as needed.    . hydrochlorothiazide (HYDRODIURIL) 25 MG tablet Take 1 tablet (25 mg total) by mouth daily. (Patient not taking: Reported on 08/11/2020) 30 tablet 11  . ibuprofen (ADVIL,MOTRIN) 200 MG tablet Take 800 mg by mouth every 6 (six) hours as needed.    Melene Muller. [START ON 09/17/2020] methylphenidate (CONCERTA) 36 MG PO CR tablet Take 1 tablet (36 mg total) by mouth daily. 30 tablet 0  . [START ON 10/15/2020] methylphenidate (CONCERTA) 36 MG PO  CR tablet Take 1 tablet (36 mg total) by mouth daily. 30 tablet 0  . [START ON 09/17/2020] methylphenidate (RITALIN) 10 MG tablet Take 1 tablet (10 mg total) by mouth daily. 30 tablet 0  . [START ON 10/15/2020] methylphenidate (RITALIN) 10 MG tablet Take 1 tablet (10 mg total) by mouth daily. 30 tablet 0  . Multiple Vitamin (MULTIVITAMIN WITH MINERALS) TABS tablet Take 1 tablet by mouth daily. (Patient not taking: Reported on 08/11/2020)    . pramipexole (MIRAPEX) 0.5 MG tablet TAKE 1 & 1/2 TABLETS (0.75 MG TOTAL) BY  MOUTH EVERY EVENING. 135 tablet 1  . Pyridoxine HCl (VITAMIN B-6 PO) Take by mouth. (Patient not taking: Reported on 08/11/2020)    . THEANINE PO Take by mouth. (Patient not taking: Reported on 08/11/2020)     No current facility-administered medications for this visit.    Medication Side Effects: None  Allergies: No Known Allergies  Past Medical History:  Diagnosis Date  . Gout   . History of peritonsillar abscess   . RLS (restless legs syndrome)     Family History  Problem Relation Age of Onset  . Cancer Mother        breast  . Diabetes Mother   . Alcohol abuse Father   . CVA Maternal Aunt   . Suicidality Maternal Uncle   . Alcohol abuse Paternal Uncle     Social History   Socioeconomic History  . Marital status: Married    Spouse name: Not on file  . Number of children: Not on file  . Years of education: Not on file  . Highest education level: Not on file  Occupational History  . Not on file  Tobacco Use  . Smoking status: Former Games developer  . Smokeless tobacco: Never Used  Vaping Use  . Vaping Use: Never used  Substance and Sexual Activity  . Alcohol use: Yes    Comment: every other day   . Drug use: No  . Sexual activity: Not on file  Other Topics Concern  . Not on file  Social History Narrative  . Not on file   Social Determinants of Health   Financial Resource Strain:   . Difficulty of Paying Living Expenses: Not on file  Food Insecurity:   . Worried About Programme researcher, broadcasting/film/video in the Last Year: Not on file  . Ran Out of Food in the Last Year: Not on file  Transportation Needs:   . Lack of Transportation (Medical): Not on file  . Lack of Transportation (Non-Medical): Not on file  Physical Activity:   . Days of Exercise per Week: Not on file  . Minutes of Exercise per Session: Not on file  Stress:   . Feeling of Stress : Not on file  Social Connections:   . Frequency of Communication with Friends and Family: Not on file  . Frequency of Social  Gatherings with Friends and Family: Not on file  . Attends Religious Services: Not on file  . Active Member of Clubs or Organizations: Not on file  . Attends Banker Meetings: Not on file  . Marital Status: Not on file  Intimate Partner Violence:   . Fear of Current or Ex-Partner: Not on file  . Emotionally Abused: Not on file  . Physically Abused: Not on file  . Sexually Abused: Not on file    Past Medical History, Surgical history, Social history, and Family history were reviewed and updated as appropriate.   Please see review  of systems for further details on the patient's review from today.   Objective:   Physical Exam:  BP 124/80   Pulse 69   Physical Exam Constitutional:      General: He is not in acute distress. Musculoskeletal:        General: No deformity.  Neurological:     Mental Status: He is alert and oriented to person, place, and time.     Coordination: Coordination normal.  Psychiatric:        Attention and Perception: Attention and perception normal. He does not perceive auditory or visual hallucinations.        Mood and Affect: Mood normal. Mood is not anxious or depressed. Affect is not labile, blunt, angry or inappropriate.        Speech: Speech normal.        Behavior: Behavior normal.        Thought Content: Thought content normal. Thought content is not paranoid or delusional. Thought content does not include homicidal or suicidal ideation. Thought content does not include homicidal or suicidal plan.        Cognition and Memory: Cognition and memory normal.        Judgment: Judgment normal.     Comments: Insight intact     Lab Review:     Component Value Date/Time   NA 143 06/28/2020 1556   K 4.2 06/28/2020 1556   CL 106 06/28/2020 1556   CO2 22 06/28/2020 1556   GLUCOSE 131 (H) 06/28/2020 1556   BUN 15 06/28/2020 1556   CREATININE 1.03 06/28/2020 1556   CALCIUM 9.1 06/28/2020 1556   PROT 7.1 06/07/2020 1659   ALBUMIN 4.5  06/07/2020 1659   AST 29 06/07/2020 1659   ALT 26 06/07/2020 1659   ALKPHOS 61 06/07/2020 1659   BILITOT 0.3 06/07/2020 1659   GFRNONAA 80 06/28/2020 1556   GFRAA 93 06/28/2020 1556       Component Value Date/Time   WBC 8.1 06/07/2020 1659   WBC 12.2 (H) 12/07/2013 0348   RBC 4.63 06/07/2020 1659   RBC 3.61 (L) 12/07/2013 0348   HGB 14.7 06/07/2020 1659   HCT 42.8 06/07/2020 1659   PLT 265 06/07/2020 1659   MCV 92 06/07/2020 1659   MCH 31.7 06/07/2020 1659   MCH 31.9 12/07/2013 0348   MCHC 34.3 06/07/2020 1659   MCHC 33.1 12/07/2013 0348   RDW 13.4 06/07/2020 1659   LYMPHSABS 2.2 12/07/2013 0348   MONOABS 1.4 (H) 12/07/2013 0348   EOSABS 0.2 12/07/2013 0348   BASOSABS 0.0 12/07/2013 0348    No results found for: POCLITH, LITHIUM   No results found for: PHENYTOIN, PHENOBARB, VALPROATE, CBMZ   .res Assessment: Plan:   Will continue current plan of care since target signs and symptoms are well controlled without any tolerability issues. Will continue Lexapro 10 mg po qd since pt reports that he has been tolerating lower dose without any worsening s/s.  Continue Concerta 36 mg po qd and Ritalin 10 mg po qd for ADHD.  Pt to f/u in 3 months or sooner if clinically indicated.  Patient advised to contact office with any questions, adverse effects, or acute worsening in signs and symptoms.  Timothy Camacho was seen today for follow-up.  Diagnoses and all orders for this visit:  Attention deficit hyperactivity disorder (ADHD), predominantly inattentive type -     methylphenidate (CONCERTA) 36 MG PO CR tablet; Take 1 tablet (36 mg total) by mouth daily. -  methylphenidate (RITALIN) 10 MG tablet; Take 1 tablet (10 mg total) by mouth daily as needed. -     methylphenidate (CONCERTA) 36 MG PO CR tablet; Take 1 tablet (36 mg total) by mouth daily. -     methylphenidate (RITALIN) 10 MG tablet; Take 1 tablet (10 mg total) by mouth daily. -     methylphenidate (CONCERTA) 36 MG PO CR  tablet; Take 1 tablet (36 mg total) by mouth daily. -     methylphenidate (RITALIN) 10 MG tablet; Take 1 tablet (10 mg total) by mouth daily.  Anxiety disorder, unspecified type -     escitalopram (LEXAPRO) 10 MG tablet; Take 1 tablet (10 mg total) by mouth daily.  Mild mood disorder (HCC) -     escitalopram (LEXAPRO) 10 MG tablet; Take 1 tablet (10 mg total) by mouth daily.  RLS (restless legs syndrome) -     pramipexole (MIRAPEX) 0.5 MG tablet; TAKE 1 & 1/2 TABLETS (0.75 MG TOTAL) BY MOUTH EVERY EVENING.     Please see After Visit Summary for patient specific instructions.  Future Appointments  Date Time Provider Department Center  09/09/2020  4:20 PM Nahser, Deloris Ping, MD [facility]  11/09/2020  1:45 PM Corie Chiquito, PMHNP [facility] None    No orders of the defined types were placed in this encounter.   -------------------------------

## 2020-09-08 ENCOUNTER — Encounter: Payer: Self-pay | Admitting: Cardiovascular Disease

## 2020-09-08 NOTE — Progress Notes (Signed)
This encounter was created in error - please disregard.

## 2020-09-09 ENCOUNTER — Encounter: Payer: BC Managed Care – PPO | Admitting: Cardiovascular Disease

## 2020-09-20 DIAGNOSIS — R5382 Chronic fatigue, unspecified: Secondary | ICD-10-CM | POA: Diagnosis not present

## 2020-09-20 DIAGNOSIS — R6882 Decreased libido: Secondary | ICD-10-CM | POA: Diagnosis not present

## 2020-10-19 ENCOUNTER — Other Ambulatory Visit: Payer: Self-pay | Admitting: Psychiatry

## 2020-10-19 DIAGNOSIS — F39 Unspecified mood [affective] disorder: Secondary | ICD-10-CM

## 2020-10-19 DIAGNOSIS — F419 Anxiety disorder, unspecified: Secondary | ICD-10-CM

## 2020-11-09 ENCOUNTER — Ambulatory Visit: Payer: BC Managed Care – PPO | Admitting: Psychiatry

## 2020-11-14 ENCOUNTER — Other Ambulatory Visit: Payer: Self-pay

## 2020-11-14 ENCOUNTER — Ambulatory Visit (INDEPENDENT_AMBULATORY_CARE_PROVIDER_SITE_OTHER): Payer: BC Managed Care – PPO | Admitting: Psychiatry

## 2020-11-14 ENCOUNTER — Encounter: Payer: Self-pay | Admitting: Psychiatry

## 2020-11-14 DIAGNOSIS — F900 Attention-deficit hyperactivity disorder, predominantly inattentive type: Secondary | ICD-10-CM

## 2020-11-14 DIAGNOSIS — F419 Anxiety disorder, unspecified: Secondary | ICD-10-CM

## 2020-11-14 DIAGNOSIS — F39 Unspecified mood [affective] disorder: Secondary | ICD-10-CM

## 2020-11-14 MED ORDER — METHYLPHENIDATE HCL ER (OSM) 36 MG PO TBCR: 36.0000 mg | EXTENDED_RELEASE_TABLET | Freq: Every day | ORAL | 0 refills | Status: DC

## 2020-11-14 MED ORDER — METHYLPHENIDATE HCL 10 MG PO TABS: 10.0000 mg | ORAL_TABLET | Freq: Every day | ORAL | 0 refills | Status: DC

## 2020-11-14 MED ORDER — ESCITALOPRAM OXALATE 10 MG PO TABS: 10.0000 mg | ORAL_TABLET | Freq: Every day | ORAL | 1 refills | Status: DC

## 2020-11-14 MED ORDER — METHYLPHENIDATE HCL 10 MG PO TABS: 10.0000 mg | ORAL_TABLET | Freq: Every day | ORAL | 0 refills | Status: DC | PRN

## 2020-11-14 NOTE — Progress Notes (Signed)
Timothy LabellaDonald G Dalpe [id number] 1961/03/02 59 y.o.  Subjective:   Patient ID:  Timothy Camacho is a 59 y.o. (DOB 1961/03/02) male.  Chief Complaint:  Chief Complaint  Patient presents with  . Follow-up    ADD, h/o mood changes, anxiety    HPI Timothy Camacho presents to the office today for follow-up of mood disturbance, anxiety, and insomnia. He reports that he has been "about the same." He reports that concentration is not as good when he is frustrated- "I seem to stay frustrated." He denies feeling nervous. He reports replaying some events from the past. "I feel more aggravated than sad." He describes low energy and motivation. He reports that he is sleeping ok and "could sleep 24 hours." Denies SI.   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 He has been working about 55 hours a week.    He reports that he feels "clearer" with Ritalin compared to Concerta and reports that response lasts about 1-2 hours.   He reports that he was taking Lexapro 1/2 tablet and increased it back to one tablet daily about a month ago.   Past Medication Trials: Lexapro Celexa Wellbutrin XL Olanzapine Risperdal Trileptal Lamictal Gabapentin Topamax Naltrexone Klonopin NAC Concerta Adhansia- intrusive thoughts, grinding teeth Methylphenidate Propranolol  Review of Systems:  Review of Systems  Cardiovascular: Negative for palpitations.  Musculoskeletal: Negative for gait problem.  Neurological: Positive for headaches. Negative for tremors.  Psychiatric/Behavioral:       Please refer to HPI    Medications: I have reviewed the patient's current medications.  Current Outpatient Medications  Medication Sig Dispense Refill  . acetaminophen (TYLENOL) 500 MG tablet Take 500 mg by mouth every 6 (six) hours as needed (pain).    Marland Kitchen. allopurinol (ZYLOPRIM) 300 MG tablet Take 300 mg by mouth daily.    . Cholecalciferol (VITAMIN D) 2000 units CAPS Take by mouth.    . Cyanocobalamin (VITAMIN  B-12) 1000 MCG/15ML LIQD Take by mouth.     Marland Kitchen. FEXOFENADINE HCL PO Take by mouth.    . hydrochlorothiazide (HYDRODIURIL) 25 MG tablet Take 1 tablet (25 mg total) by mouth daily. 30 tablet 11  . ibuprofen (ADVIL,MOTRIN) 200 MG tablet Take 800 mg by mouth every 6 (six) hours as needed.    Melene Muller. [START ON 01/09/2021] methylphenidate (CONCERTA) 36 MG PO CR tablet Take 1 tablet (36 mg total) by mouth daily. 30 tablet 0  . [START ON 12/12/2020] methylphenidate (CONCERTA) 36 MG PO CR tablet Take 1 tablet (36 mg total) by mouth daily. 30 tablet 0  . [START ON 01/09/2021] methylphenidate (RITALIN) 10 MG tablet Take 1 tablet (10 mg total) by mouth daily as needed. 30 tablet 0  . Multiple Vitamins-Minerals (ZINC PO) Take by mouth.    . potassium chloride (KLOR-CON) 10 MEQ tablet Take 1 tablet (10 mEq total) by mouth daily. 30 tablet 11  . pramipexole (MIRAPEX) 0.5 MG tablet TAKE 1 & 1/2 TABLETS (0.75 MG TOTAL) BY MOUTH EVERY EVENING. 135 tablet 1  . propranolol (INDERAL) 10 MG tablet Take 1 tablet (10 mg total) by mouth 4 (four) times daily as needed (palpitations or fast heart rate). 60 tablet 6  . Pyridoxine HCl (VITAMIN B-6 PO) Take by mouth.     . escitalopram (LEXAPRO) 10 MG tablet Take 1 tablet (10 mg total) by mouth daily. 90 tablet 1  . fluticasone (FLONASE) 50 MCG/ACT nasal spray Place 1 spray into both nostrils as needed. (Patient not taking: Reported on 11/14/2020)    .  methylphenidate (CONCERTA) 36 MG PO CR tablet Take 1 tablet (36 mg total) by mouth daily. 30 tablet 0  . [START ON 12/12/2020] methylphenidate (RITALIN) 10 MG tablet Take 1 tablet (10 mg total) by mouth daily. 30 tablet 0  . methylphenidate (RITALIN) 10 MG tablet Take 1 tablet (10 mg total) by mouth daily. 30 tablet 0  . POTASSIUM PO Take by mouth.     No current facility-administered medications for this visit.    Medication Side Effects: None  Allergies: No Known Allergies  Past Medical History:  Diagnosis Date  . Gout   .  History of peritonsillar abscess   . RLS (restless legs syndrome)     Family History  Problem Relation Age of Onset  . Cancer Mother        breast  . Diabetes Mother   . Alcohol abuse Father   . CVA Maternal Aunt   . Suicidality Maternal Uncle   . Alcohol abuse Paternal Uncle     Social History   Socioeconomic History  . Marital status: Married    Spouse name: Not on file  . Number of children: Not on file  . Years of education: Not on file  . Highest education level: Not on file  Occupational History  . Not on file  Tobacco Use  . Smoking status: Former Games developer  . Smokeless tobacco: Never Used  Vaping Use  . Vaping Use: Never used  Substance and Sexual Activity  . Alcohol use: Yes    Comment: every other day   . Drug use: No  . Sexual activity: Not on file  Other Topics Concern  . Not on file  Social History Narrative  . Not on file   Social Determinants of Health   Financial Resource Strain:   . Difficulty of Paying Living Expenses: Not on file  Food Insecurity:   . Worried About Programme researcher, broadcasting/film/video in the Last Year: Not on file  . Ran Out of Food in the Last Year: Not on file  Transportation Needs:   . Lack of Transportation (Medical): Not on file  . Lack of Transportation (Non-Medical): Not on file  Physical Activity:   . Days of Exercise per Week: Not on file  . Minutes of Exercise per Session: Not on file  Stress:   . Feeling of Stress : Not on file  Social Connections:   . Frequency of Communication with Friends and Family: Not on file  . Frequency of Social Gatherings with Friends and Family: Not on file  . Attends Religious Services: Not on file  . Active Member of Clubs or Organizations: Not on file  . Attends Banker Meetings: Not on file  . Marital Status: Not on file  Intimate Partner Violence:   . Fear of Current or Ex-Partner: Not on file  . Emotionally Abused: Not on file  . Physically Abused: Not on file  . Sexually  Abused: Not on file    Past Medical History, Surgical history, Social history, and Family history were reviewed and updated as appropriate.   Please see review of systems for further details on the patient's review from today.   Objective:   Physical Exam:  BP 128/84   Pulse (!) 59   Physical Exam Constitutional:      General: He is not in acute distress. Musculoskeletal:        General: No deformity.  Neurological:     Mental Status: He is alert and oriented  to person, place, and time.     Coordination: Coordination normal.  Psychiatric:        Attention and Perception: Attention and perception normal. He does not perceive auditory or visual hallucinations.        Mood and Affect: Affect is not labile, blunt, angry or inappropriate.        Speech: Speech normal.        Behavior: Behavior normal.        Thought Content: Thought content normal. Thought content is not paranoid or delusional. Thought content does not include homicidal or suicidal ideation. Thought content does not include homicidal or suicidal plan.        Cognition and Memory: Cognition and memory normal.        Judgment: Judgment normal.     Comments: Insight intact Dysphoric mood     Lab Review:     Component Value Date/Time   NA 143 06/28/2020 1556   K 4.2 06/28/2020 1556   CL 106 06/28/2020 1556   CO2 22 06/28/2020 1556   GLUCOSE 131 (H) 06/28/2020 1556   BUN 15 06/28/2020 1556   CREATININE 1.03 06/28/2020 1556   CALCIUM 9.1 06/28/2020 1556   PROT 7.1 06/07/2020 1659   ALBUMIN 4.5 06/07/2020 1659   AST 29 06/07/2020 1659   ALT 26 06/07/2020 1659   ALKPHOS 61 06/07/2020 1659   BILITOT 0.3 06/07/2020 1659   GFRNONAA 80 06/28/2020 1556   GFRAA 93 06/28/2020 1556       Component Value Date/Time   WBC 8.1 06/07/2020 1659   WBC 12.2 (H) 12/07/2013 0348   RBC 4.63 06/07/2020 1659   RBC 3.61 (L) 12/07/2013 0348   HGB 14.7 06/07/2020 1659   HCT 42.8 06/07/2020 1659   PLT 265 06/07/2020 1659    MCV 92 06/07/2020 1659   MCH 31.7 06/07/2020 1659   MCH 31.9 12/07/2013 0348   MCHC 34.3 06/07/2020 1659   MCHC 33.1 12/07/2013 0348   RDW 13.4 06/07/2020 1659   LYMPHSABS 2.2 12/07/2013 0348   MONOABS 1.4 (H) 12/07/2013 0348   EOSABS 0.2 12/07/2013 0348   BASOSABS 0.0 12/07/2013 0348    No results found for: POCLITH, LITHIUM   No results found for: PHENYTOIN, PHENOBARB, VALPROATE, CBMZ   .res Assessment: Plan:   Patient reports that he would like to continue current medications without changes.  He reports that Concerta and methylphenidate do not seem to be causing any anxiety or irritability and reports that medications seem to decrease his frustration overall. Continue Concerta 36 mg daily for attention deficit disorder. Continue Ritalin 10 mg daily for concentration. Continue Lexapro 10 mg daily for mood and anxiety. Continue pramipexole for restless legs. Patient to follow-up in 3 months or sooner if clinically indicated. Patient advised to contact office with any questions, adverse effects, or acute worsening in signs and symptoms.  Yannick was seen today for follow-up.  Diagnoses and all orders for this visit:  Anxiety disorder, unspecified type -     escitalopram (LEXAPRO) 10 MG tablet; Take 1 tablet (10 mg total) by mouth daily.  Mild mood disorder (HCC) -     escitalopram (LEXAPRO) 10 MG tablet; Take 1 tablet (10 mg total) by mouth daily.  Attention deficit hyperactivity disorder (ADHD), predominantly inattentive type -     methylphenidate (CONCERTA) 36 MG PO CR tablet; Take 1 tablet (36 mg total) by mouth daily. -     methylphenidate (CONCERTA) 36 MG PO CR tablet; Take 1 tablet (  36 mg total) by mouth daily. -     methylphenidate (RITALIN) 10 MG tablet; Take 1 tablet (10 mg total) by mouth daily as needed. -     methylphenidate (CONCERTA) 36 MG PO CR tablet; Take 1 tablet (36 mg total) by mouth daily. -     methylphenidate (RITALIN) 10 MG tablet; Take 1 tablet (10 mg  total) by mouth daily. -     methylphenidate (RITALIN) 10 MG tablet; Take 1 tablet (10 mg total) by mouth daily.     Please see After Visit Summary for patient specific instructions.  Future Appointments  Date Time Provider Department Center  02/13/2021 12:45 PM Corie Chiquito, PMHNP [facility] None    No orders of the defined types were placed in this encounter.   -------------------------------

## 2020-12-14 ENCOUNTER — Ambulatory Visit
Admission: RE | Admit: 2020-12-14 | Discharge: 2020-12-14 | Disposition: A | Discharge status: Home / Self Care | Payer: BC Managed Care – PPO | Source: Ambulatory Visit | Attending: Internal Medicine | Admitting: Internal Medicine

## 2020-12-14 ENCOUNTER — Other Ambulatory Visit: Payer: Self-pay | Admitting: Internal Medicine

## 2020-12-14 ENCOUNTER — Other Ambulatory Visit: Payer: Self-pay

## 2020-12-14 DIAGNOSIS — R5382 Chronic fatigue, unspecified: Secondary | ICD-10-CM | POA: Diagnosis not present

## 2020-12-14 DIAGNOSIS — R519 Headache, unspecified: Secondary | ICD-10-CM | POA: Diagnosis not present

## 2020-12-14 DIAGNOSIS — G4452 New daily persistent headache (NDPH): Secondary | ICD-10-CM

## 2020-12-21 DIAGNOSIS — E559 Vitamin D deficiency, unspecified: Secondary | ICD-10-CM | POA: Diagnosis not present

## 2020-12-21 DIAGNOSIS — R7303 Prediabetes: Secondary | ICD-10-CM | POA: Diagnosis not present

## 2020-12-21 DIAGNOSIS — E291 Testicular hypofunction: Secondary | ICD-10-CM | POA: Diagnosis not present

## 2020-12-21 DIAGNOSIS — R635 Abnormal weight gain: Secondary | ICD-10-CM | POA: Diagnosis not present

## 2020-12-29 DIAGNOSIS — Z1331 Encounter for screening for depression: Secondary | ICD-10-CM | POA: Diagnosis not present

## 2020-12-29 DIAGNOSIS — R635 Abnormal weight gain: Secondary | ICD-10-CM | POA: Diagnosis not present

## 2020-12-29 DIAGNOSIS — Z1339 Encounter for screening examination for other mental health and behavioral disorders: Secondary | ICD-10-CM | POA: Diagnosis not present

## 2020-12-29 DIAGNOSIS — M109 Gout, unspecified: Secondary | ICD-10-CM | POA: Diagnosis not present

## 2021-01-03 DIAGNOSIS — F419 Anxiety disorder, unspecified: Secondary | ICD-10-CM | POA: Diagnosis not present

## 2021-01-03 DIAGNOSIS — R7303 Prediabetes: Secondary | ICD-10-CM | POA: Diagnosis not present

## 2021-01-03 DIAGNOSIS — R6882 Decreased libido: Secondary | ICD-10-CM | POA: Diagnosis not present

## 2021-01-03 DIAGNOSIS — E291 Testicular hypofunction: Secondary | ICD-10-CM | POA: Diagnosis not present

## 2021-02-07 DIAGNOSIS — E291 Testicular hypofunction: Secondary | ICD-10-CM | POA: Diagnosis not present

## 2021-02-07 DIAGNOSIS — Z79890 Hormone replacement therapy (postmenopausal): Secondary | ICD-10-CM | POA: Diagnosis not present

## 2021-02-09 DIAGNOSIS — R6882 Decreased libido: Secondary | ICD-10-CM | POA: Diagnosis not present

## 2021-02-09 DIAGNOSIS — F419 Anxiety disorder, unspecified: Secondary | ICD-10-CM | POA: Diagnosis not present

## 2021-02-09 DIAGNOSIS — E291 Testicular hypofunction: Secondary | ICD-10-CM | POA: Diagnosis not present

## 2021-02-13 ENCOUNTER — Encounter: Payer: Self-pay | Admitting: Psychiatry

## 2021-02-13 ENCOUNTER — Telehealth (INDEPENDENT_AMBULATORY_CARE_PROVIDER_SITE_OTHER): Payer: Commercial Managed Care - PPO | Admitting: Psychiatry

## 2021-02-13 ENCOUNTER — Telehealth: Payer: Self-pay | Admitting: Psychiatry

## 2021-02-13 DIAGNOSIS — F900 Attention-deficit hyperactivity disorder, predominantly inattentive type: Secondary | ICD-10-CM

## 2021-02-13 DIAGNOSIS — F419 Anxiety disorder, unspecified: Secondary | ICD-10-CM

## 2021-02-13 DIAGNOSIS — F39 Unspecified mood [affective] disorder: Secondary | ICD-10-CM

## 2021-02-13 MED ORDER — PROPRANOLOL HCL 10 MG PO TABS: 10.0000 mg | ORAL_TABLET | Freq: Four times a day (QID) | ORAL | 6 refills | Status: DC | PRN

## 2021-02-13 MED ORDER — ESCITALOPRAM OXALATE 10 MG PO TABS: 10.0000 mg | ORAL_TABLET | Freq: Every day | ORAL | 1 refills | Status: DC

## 2021-02-13 MED ORDER — METHYLPHENIDATE HCL ER (OSM) 36 MG PO TBCR: 36.0000 mg | EXTENDED_RELEASE_TABLET | Freq: Every day | ORAL | 0 refills | Status: DC

## 2021-02-13 MED ORDER — METHYLPHENIDATE HCL 10 MG PO TABS: 10.0000 mg | ORAL_TABLET | Freq: Every day | ORAL | 0 refills | Status: DC

## 2021-02-13 MED ORDER — METHYLPHENIDATE HCL 10 MG PO TABS: 10.0000 mg | ORAL_TABLET | Freq: Every day | ORAL | 0 refills | Status: DC | PRN

## 2021-02-13 NOTE — Progress Notes (Signed)
Timothy Camacho [id number] March 25, 1961 60 y.o.  Virtual Visit via Telephone Note  I connected with pt on 02/13/21 at 12:45 PM EST by telephone and verified that I am speaking with the correct person using two identifiers.   I discussed the limitations, risks, security and privacy concerns of performing an evaluation and management service by telephone and the availability of in person appointments. I also discussed with the patient that there may be a patient responsible charge related to this service. The patient expressed understanding and agreed to proceed.   I discussed the assessment and treatment plan with the patient. The patient was provided an opportunity to ask questions and all were answered. The patient agreed with the plan and demonstrated an understanding of the instructions.   The patient was advised to call back or seek an in-person evaluation if the symptoms worsen or if the condition fails to improve as anticipated.  I provided 20 minutes of non-face-to-face time during this encounter.  The patient was located at home.  The provider was located at [facility].   Corie Chiquito, PMHNP   Subjective:   Patient ID:  Timothy Camacho is a 60 y.o. (DOB 1961-04-06) male.  Chief Complaint:  Chief Complaint  Patient presents with  . Follow-up    ADHD, Anxiety     HPI Timothy Camacho presents for follow-up of ADD.   He is now working in [employer]. 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   He reports feeling "somewhat better." He reports that he has been going to [facility] and started HRT for low T. He reports that he started pellets about a month ago and that his hormone levels have improved significantly. He reports, "I do feel better. I don't feel 100%." He is now able to stay up later if he would like instead of feeling like he has to go to bed at 8 pm. Denies difficulty falling asleep. Awakens nightly at 2 am  and then returns to sleep. He reports that getting up in the morning has been easier. He reports that his energy has improved slightly. Motivation has also improved some. Appetite has been good. He reports that he does not feel hungry all the time. Denies persistent sad mood. He reports that he is "a little snippy, a little less patient." He reports that concentration has been somewhat improved. Denies SI.   He reports that he has had some occasional anxiety in the middle of the night/early morning with some shortness of breath. He reports that it is relieved with Propranolol prn.  He reports that it happened several nights in a row about 2-3 weeks ago. Denies any recent anxiety in the middle of the day since he changed jobs. Continues to grit teeth in his sleep.   He reports that his mother has not been doing as well and is having some heart issues. Mother has upcoming cardioversion.   Past Medication Trials: Lexapro Celexa Wellbutrin XL Olanzapine Risperdal Trileptal Lamictal Gabapentin Topamax Naltrexone Klonopin NAC Concerta Adhansia- intrusive thoughts, grinding teeth Methylphenidate Propranolol  Review of Systems:  Review of Systems  Cardiovascular: Negative for palpitations.  Gastrointestinal: Negative.   Musculoskeletal: Negative for gait problem.  Neurological: Negative for tremors and headaches.  Psychiatric/Behavioral:       Please refer to HPI    Medications: I have reviewed the patient's current medications.  Current Outpatient Medications  Medication Sig Dispense Refill  . allopurinol (  ZYLOPRIM) 300 MG tablet Take 300 mg by mouth daily.    Marland Kitchen. anastrozole (ARIMIDEX) 1 MG tablet Take 1 mg by mouth once a week.    Marland Kitchen. acetaminophen (TYLENOL) 500 MG tablet Take 500 mg by mouth every 6 (six) hours as needed (pain).    . Cholecalciferol (VITAMIN D) 2000 units CAPS Take by mouth.    . Cyanocobalamin (VITAMIN B-12) 1000 MCG/15ML LIQD Take by mouth.     . escitalopram  (LEXAPRO) 10 MG tablet Take 1 tablet (10 mg total) by mouth daily. 90 tablet 1  . FEXOFENADINE HCL PO Take by mouth.    . fluticasone (FLONASE) 50 MCG/ACT nasal spray Place 1 spray into both nostrils as needed. (Patient not taking: No sig reported)    . hydrochlorothiazide (HYDRODIURIL) 25 MG tablet Take 1 tablet (25 mg total) by mouth daily. 30 tablet 11  . ibuprofen (ADVIL,MOTRIN) 200 MG tablet Take 800 mg by mouth every 6 (six) hours as needed.    Melene Muller. [START ON 04/27/2021] methylphenidate (CONCERTA) 36 MG PO CR tablet Take 1 tablet (36 mg total) by mouth daily. 30 tablet 0  . [START ON 03/30/2021] methylphenidate (CONCERTA) 36 MG PO CR tablet Take 1 tablet (36 mg total) by mouth daily. 30 tablet 0  . [START ON 03/02/2021] methylphenidate (CONCERTA) 36 MG PO CR tablet Take 1 tablet (36 mg total) by mouth daily. 30 tablet 0  . [START ON 04/27/2021] methylphenidate (RITALIN) 10 MG tablet Take 1 tablet (10 mg total) by mouth daily as needed. 30 tablet 0  . [START ON 03/30/2021] methylphenidate (RITALIN) 10 MG tablet Take 1 tablet (10 mg total) by mouth daily. 30 tablet 0  . [START ON 03/02/2021] methylphenidate (RITALIN) 10 MG tablet Take 1 tablet (10 mg total) by mouth daily. 30 tablet 0  . Multiple Vitamins-Minerals (ZINC PO) Take by mouth.    . potassium chloride (KLOR-CON) 10 MEQ tablet Take 1 tablet (10 mEq total) by mouth daily. 30 tablet 11  . POTASSIUM PO Take by mouth.    . pramipexole (MIRAPEX) 0.5 MG tablet TAKE 1 & 1/2 TABLETS (0.75 MG TOTAL) BY MOUTH EVERY EVENING. 135 tablet 1  . propranolol (INDERAL) 10 MG tablet Take 1 tablet (10 mg total) by mouth 4 (four) times daily as needed (palpitations, fast heart rate, or anxiety). 60 tablet 6  . Pyridoxine HCl (VITAMIN B-6 PO) Take by mouth.      No current facility-administered medications for this visit.    Medication Side Effects: None  Allergies: No Known Allergies  Past Medical History:  Diagnosis Date  . Gout   . History of  peritonsillar abscess   . RLS (restless legs syndrome)     Family History  Problem Relation Age of Onset  . Cancer Mother        breast  . Diabetes Mother   . Alcohol abuse Father   . CVA Maternal Aunt   . Suicidality Maternal Uncle   . Alcohol abuse Paternal Uncle     Social History   Socioeconomic History  . Marital status: Married    Spouse name: Not on file  . Number of children: Not on file  . Years of education: Not on file  . Highest education level: Not on file  Occupational History  . Not on file  Tobacco Use  . Smoking status: Former Games developermoker  . Smokeless tobacco: Never Used  Vaping Use  . Vaping Use: Never used  Substance and Sexual Activity  .  Alcohol use: Yes    Comment: every other day   . Drug use: No  . Sexual activity: Not on file  Other Topics Concern  . Not on file  Social History Narrative  . Not on file   Social Determinants of Health   Financial Resource Strain: Not on file  Food Insecurity: Not on file  Transportation Needs: Not on file  Physical Activity: Not on file  Stress: Not on file  Social Connections: Not on file  Intimate Partner Violence: Not on file    Past Medical History, Surgical history, Social history, and Family history were reviewed and updated as appropriate.   Please see review of systems for further details on the patient's review from today.   Objective:   Physical Exam:  BP 125/80   Pulse 70   Wt 256 lb (116.1 kg)   BMI 36.73 kg/m   Physical Exam Neurological:     Mental Status: He is alert and oriented to person, place, and time.     Cranial Nerves: No dysarthria.  Psychiatric:        Attention and Perception: Attention and perception normal.        Mood and Affect: Mood normal.        Speech: Speech normal.        Behavior: Behavior is cooperative.        Thought Content: Thought content normal. Thought content is not paranoid or delusional. Thought content does not include homicidal or suicidal  ideation. Thought content does not include homicidal or suicidal plan.        Cognition and Memory: Cognition and memory normal.        Judgment: Judgment normal.     Comments: Insight intact     Lab Review:     Component Value Date/Time   NA 143 06/28/2020 1556   K 4.2 06/28/2020 1556   CL 106 06/28/2020 1556   CO2 22 06/28/2020 1556   GLUCOSE 131 (H) 06/28/2020 1556   BUN 15 06/28/2020 1556   CREATININE 1.03 06/28/2020 1556   CALCIUM 9.1 06/28/2020 1556   PROT 7.1 06/07/2020 1659   ALBUMIN 4.5 06/07/2020 1659   AST 29 06/07/2020 1659   ALT 26 06/07/2020 1659   ALKPHOS 61 06/07/2020 1659   BILITOT 0.3 06/07/2020 1659   GFRNONAA 80 06/28/2020 1556   GFRAA 93 06/28/2020 1556       Component Value Date/Time   WBC 8.1 06/07/2020 1659   WBC 12.2 (H) 12/07/2013 0348   RBC 4.63 06/07/2020 1659   RBC 3.61 (L) 12/07/2013 0348   HGB 14.7 06/07/2020 1659   HCT 42.8 06/07/2020 1659   PLT 265 06/07/2020 1659   MCV 92 06/07/2020 1659   MCH 31.7 06/07/2020 1659   MCH 31.9 12/07/2013 0348   MCHC 34.3 06/07/2020 1659   MCHC 33.1 12/07/2013 0348   RDW 13.4 06/07/2020 1659   LYMPHSABS 2.2 12/07/2013 0348   MONOABS 1.4 (H) 12/07/2013 0348   EOSABS 0.2 12/07/2013 0348   BASOSABS 0.0 12/07/2013 0348    No results found for: POCLITH, LITHIUM   No results found for: PHENYTOIN, PHENOBARB, VALPROATE, CBMZ   .res Assessment: Plan:   Will continue current plan of care since pt reports some recent overall improvement in s/s with starting hormone replacement therapy. Will continue Lexapro 10 mg po qd for depression and anxiety.  Continue Propranolol 10 mg po QID prn anxiety.  Continue Concerta 26 mg po q am for ADHD.  Continue Ritalin 10 mg po qd prn.  Pt to follow-up in 3 months or sooner if clinically indicated.  Patient advised to contact office with any questions, adverse effects, or acute worsening in signs and symptoms.   Timothy Camacho was seen today for follow-up.  Diagnoses and  all orders for this visit:  Attention deficit hyperactivity disorder (ADHD), predominantly inattentive type -     methylphenidate (CONCERTA) 36 MG PO CR tablet; Take 1 tablet (36 mg total) by mouth daily. -     methylphenidate (CONCERTA) 36 MG PO CR tablet; Take 1 tablet (36 mg total) by mouth daily. -     methylphenidate (CONCERTA) 36 MG PO CR tablet; Take 1 tablet (36 mg total) by mouth daily. -     methylphenidate (RITALIN) 10 MG tablet; Take 1 tablet (10 mg total) by mouth daily as needed. -     methylphenidate (RITALIN) 10 MG tablet; Take 1 tablet (10 mg total) by mouth daily. -     methylphenidate (RITALIN) 10 MG tablet; Take 1 tablet (10 mg total) by mouth daily.  Anxiety disorder, unspecified type -     escitalopram (LEXAPRO) 10 MG tablet; Take 1 tablet (10 mg total) by mouth daily. -     propranolol (INDERAL) 10 MG tablet; Take 1 tablet (10 mg total) by mouth 4 (four) times daily as needed (palpitations, fast heart rate, or anxiety).  Mild mood disorder (HCC) -     escitalopram (LEXAPRO) 10 MG tablet; Take 1 tablet (10 mg total) by mouth daily.    Please see After Visit Summary for patient specific instructions.  No future appointments.  No orders of the defined types were placed in this encounter.     -------------------------------

## 2021-02-13 NOTE — Telephone Encounter (Signed)
Timothy Camacho, Timothy Camacho are scheduled for a virtual visit with your provider today.    Just as we do with appointments in the office, we must obtain your consent to participate.  Your consent will be active for this visit and any virtual visit you may have with one of our providers in the next 365 days.    If you have a MyChart account, I can also send a copy of this consent to you electronically.  All virtual visits are billed to your insurance company just like a traditional visit in the office.  As this is a virtual visit, video technology does not allow for your provider to perform a traditional examination.  This may limit your provider's ability to fully assess your condition.  If your provider identifies any concerns that need to be evaluated in person or the need to arrange testing such as labs, EKG, etc, we will make arrangements to do so.    Although advances in technology are sophisticated, we cannot ensure that it will always work on either your end or our end.  If the connection with a video visit is poor, we may have to switch to a telephone visit.  With either a video or telephone visit, we are not always able to ensure that we have a secure connection.   I need to obtain your verbal consent now.   Are you willing to proceed with your visit today?   Timothy Camacho has provided verbal consent on 02/13/2021 for a virtual visit (video or telephone).   Corie Chiquito, PMHNP 02/13/2021  12:46 PM

## 2021-04-20 ENCOUNTER — Other Ambulatory Visit: Payer: Self-pay | Admitting: Psychiatry

## 2021-04-20 DIAGNOSIS — G2581 Restless legs syndrome: Secondary | ICD-10-CM

## 2021-04-29 ENCOUNTER — Emergency Department (HOSPITAL_COMMUNITY)
Admission: EM | Admit: 2021-04-29 | Discharge: 2021-04-29 | Disposition: A | Discharge status: Home / Self Care | Payer: Self-pay | Attending: Emergency Medicine | Admitting: Emergency Medicine

## 2021-04-29 ENCOUNTER — Encounter (HOSPITAL_COMMUNITY): Payer: Self-pay

## 2021-04-29 ENCOUNTER — Emergency Department (HOSPITAL_COMMUNITY): Payer: Self-pay

## 2021-04-29 ENCOUNTER — Other Ambulatory Visit: Payer: Self-pay

## 2021-04-29 DIAGNOSIS — I10 Essential (primary) hypertension: Secondary | ICD-10-CM | POA: Insufficient documentation

## 2021-04-29 DIAGNOSIS — R1110 Vomiting, unspecified: Secondary | ICD-10-CM | POA: Insufficient documentation

## 2021-04-29 DIAGNOSIS — Z79899 Other long term (current) drug therapy: Secondary | ICD-10-CM | POA: Insufficient documentation

## 2021-04-29 DIAGNOSIS — Z87891 Personal history of nicotine dependence: Secondary | ICD-10-CM | POA: Insufficient documentation

## 2021-04-29 DIAGNOSIS — R451 Restlessness and agitation: Secondary | ICD-10-CM | POA: Insufficient documentation

## 2021-04-29 DIAGNOSIS — F419 Anxiety disorder, unspecified: Secondary | ICD-10-CM | POA: Insufficient documentation

## 2021-04-29 DIAGNOSIS — R61 Generalized hyperhidrosis: Secondary | ICD-10-CM | POA: Insufficient documentation

## 2021-04-29 LAB — CBC WITH DIFFERENTIAL/PLATELET
Abs Immature Granulocytes: 0.07 10*3/uL (ref 0.00–0.07)
Basophils Absolute: 0.1 10*3/uL (ref 0.0–0.1)
Basophils Relative: 1 %
Eosinophils Absolute: 0.4 10*3/uL (ref 0.0–0.5)
Eosinophils Relative: 4 %
HCT: 47.5 % (ref 39.0–52.0)
Hemoglobin: 15.8 g/dL (ref 13.0–17.0)
Immature Granulocytes: 1 %
Lymphocytes Relative: 25 %
Lymphs Abs: 2.4 10*3/uL (ref 0.7–4.0)
MCH: 30.6 pg (ref 26.0–34.0)
MCHC: 33.3 g/dL (ref 30.0–36.0)
MCV: 91.9 fL (ref 80.0–100.0)
Monocytes Absolute: 1.3 10*3/uL — ABNORMAL HIGH (ref 0.1–1.0)
Monocytes Relative: 13 %
Neutro Abs: 5.5 10*3/uL (ref 1.7–7.7)
Neutrophils Relative %: 56 %
Platelets: 262 10*3/uL (ref 150–400)
RBC: 5.17 MIL/uL (ref 4.22–5.81)
RDW: 14.2 % (ref 11.5–15.5)
WBC: 9.7 10*3/uL (ref 4.0–10.5)
nRBC: 0 % (ref 0.0–0.2)

## 2021-04-29 LAB — COMPREHENSIVE METABOLIC PANEL
ALT: 33 U/L (ref 0–44)
AST: 33 U/L (ref 15–41)
Albumin: 3.8 g/dL (ref 3.5–5.0)
Alkaline Phosphatase: 45 U/L (ref 38–126)
Anion gap: 5 (ref 5–15)
BUN: 15 mg/dL (ref 6–20)
CO2: 29 mmol/L (ref 22–32)
Calcium: 9 mg/dL (ref 8.9–10.3)
Chloride: 105 mmol/L (ref 98–111)
Creatinine, Ser: 1.11 mg/dL (ref 0.61–1.24)
GFR, Estimated: >60 mL/min (ref >60)
Glucose, Bld: 126 mg/dL — ABNORMAL HIGH (ref 70–99)
Potassium: 4.5 mmol/L (ref 3.5–5.1)
Sodium: 139 mmol/L (ref 135–145)
Total Bilirubin: 0.6 mg/dL (ref 0.3–1.2)
Total Protein: 7.1 g/dL (ref 6.5–8.1)

## 2021-04-29 LAB — TROPONIN I (HIGH SENSITIVITY)
Troponin I (High Sensitivity): 6 ng/L (ref <18)
Troponin I (High Sensitivity): 5 ng/L (ref <18)

## 2021-04-29 NOTE — ED Notes (Signed)
Provider at bedside

## 2021-04-29 NOTE — ED Provider Notes (Signed)
MSE was initiated and I personally evaluated the patient and placed orders (if any) at  12:17 AM on Apr 29, 2021.  SOB mainly with lying down, but that prevents sleep, "like what I think a panic attack feels like". Has had vomiting. Reports tingling into his hands. Fluctuates but does not go away. No chest pain. No fever, cough, congestion.   RRR Lungs clear  The patient appears stable so that the remainder of the MSE may be completed by another provider.   Elpidio Anis, PA-C 04/29/21 0020    Gilda Crease, MD 04/29/21 [phone]

## 2021-04-29 NOTE — ED Notes (Addendum)
Unable to lay down for the last 2 nights, up pacing around, with nausea, dizziness, numbness in hands and feet, and headache, tonight diaphoretic with vomiting

## 2021-04-29 NOTE — ED Provider Notes (Signed)
[facility] EMERGENCY DEPARTMENT Provider Note   CSN: [id number] Arrival date & time: 04/29/21  0008     History Chief Complaint  Patient presents with  . Anxiety  . Shortness of Breath  . Emesis    Timothy Camacho is a 60 y.o. male.  Patient to ED with symptoms described as agitation and restlessness. He reports that when he lies down to go to sleep he will waken suddenly after a very short period with chest tightness, SOB, heart racing, sometimes with facial, hand and feet tingling and nausea. Tonight symptoms worsened and included diaphoresis and vomiting. No recent fever or illness. No abdominal pain. No syncope or near syncope. Wife gave him 0.5 mg Ativan that did not provide any change in symptoms.   The history is provided by the patient and the spouse. No language interpreter was used.  Anxiety Associated symptoms include shortness of breath.  Shortness of Breath Associated symptoms: diaphoresis and vomiting   Associated symptoms: no fever   Emesis Associated symptoms: no chills and no fever        Past Medical History:  Diagnosis Date  . Gout   . History of peritonsillar abscess   . RLS (restless legs syndrome)     Patient Active Problem List   Diagnosis Date Noted  . HTN (hypertension) 06/07/2020  . Mild mood disorder (HCC) 09/29/2018  . Anxiety 09/29/2018  . Biceps rupture, proximal, right, initial encounter 08/19/2018  . Peritonsillar abscess 12/06/2013    Past Surgical History:  Procedure Laterality Date  . APPENDECTOMY    . DIRECT LARYNGOSCOPY N/A 12/05/2013   Procedure: DIRECT LARYNGOSCOPY;  Surgeon: Flo Shanks, MD;  Location: [facility] ORS;  Service: ENT;  Laterality: N/A;  . TONSILLECTOMY Right 12/05/2013   Procedure: TONSILLECTOMY;  Surgeon: Flo Shanks, MD;  Location: [facility] ORS;  Service: ENT;  Laterality: Right;  . TRACHEOSTOMY TUBE PLACEMENT N/A 12/05/2013   Procedure: TRACHEOSTOMY;  Surgeon: Flo Shanks, MD;  Location: [facility]  ORS;  Service: ENT;  Laterality: N/A;       Family History  Problem Relation Age of Onset  . Cancer Mother        breast  . Diabetes Mother   . Alcohol abuse Father   . CVA Maternal Aunt   . Suicidality Maternal Uncle   . Alcohol abuse Paternal Uncle     Social History   Tobacco Use  . Smoking status: Former Games developer  . Smokeless tobacco: Never Used  Vaping Use  . Vaping Use: Never used  Substance Use Topics  . Alcohol use: Yes    Comment: every other day   . Drug use: No    Home Medications Prior to Admission medications   Medication Sig Start Date End Date Taking? Authorizing Provider  acetaminophen (TYLENOL) 500 MG tablet Take 500 mg by mouth every 6 (six) hours as needed (pain).   Yes [provider]  allopurinol (ZYLOPRIM) 300 MG tablet Take 300 mg by mouth daily.   Yes [provider]  Cholecalciferol (VITAMIN D) 2000 units CAPS Take by mouth.   Yes [provider]  Cyanocobalamin (VITAMIN B-12) 1000 MCG/15ML LIQD Take 15 mLs by mouth daily.   Yes [provider]  escitalopram (LEXAPRO) 10 MG tablet Take 1 tablet (10 mg total) by mouth daily. Patient taking differently: Take 20 mg by mouth daily. 02/13/21 05/14/21 Yes Corie Chiquito, PMHNP  hydrochlorothiazide (HYDRODIURIL) 25 MG tablet Take 1 tablet (25 mg total) by mouth daily. 06/07/20  Yes Nahser, Deloris Ping, MD  ibuprofen (ADVIL,MOTRIN) 200 MG tablet Take 800 mg by mouth every 6 (six) hours as needed for headache or moderate pain.   Yes [provider]  methylphenidate (CONCERTA) 36 MG PO CR tablet Take 1 tablet (36 mg total) by mouth daily. 04/27/21  Yes Corie Chiquito, PMHNP  methylphenidate (RITALIN) 10 MG tablet Take 1 tablet (10 mg total) by mouth daily as needed. Patient taking differently: Take 10 mg by mouth daily as needed (focus). 04/27/21  Yes Corie Chiquito, PMHNP  potassium chloride (KLOR-CON) 10 MEQ tablet Take 1 tablet (10 mEq total) by mouth daily. 06/07/20   Yes Nahser, Deloris Ping, MD  pramipexole (MIRAPEX) 0.5 MG tablet TAKE 1 & 1/2 TABLETS (0.75 MG TOTAL) BY MOUTH EVERY EVENING. Patient taking differently: Take 0.75 mg by mouth every evening. 04/21/21  Yes Corie Chiquito, PMHNP  propranolol (INDERAL) 10 MG tablet Take 1 tablet (10 mg total) by mouth 4 (four) times daily as needed (palpitations, fast heart rate, or anxiety). 02/13/21  Yes Corie Chiquito, PMHNP  Pyridoxine HCl (VITAMIN B-6 PO) Take by mouth.    Yes [provider]  methylphenidate (CONCERTA) 36 MG PO CR tablet Take 1 tablet (36 mg total) by mouth daily. 03/30/21   Corie Chiquito, PMHNP  methylphenidate (RITALIN) 10 MG tablet Take 1 tablet (10 mg total) by mouth daily. 03/30/21 04/29/21  Corie Chiquito, PMHNP    Allergies    Risperidone and related and Trileptal [oxcarbazepine]  Review of Systems   Review of Systems  Constitutional: Positive for diaphoresis. Negative for chills and fever.  HENT: Negative.   Eyes: Negative for visual disturbance.  Respiratory: Positive for chest tightness and shortness of breath.   Cardiovascular: Positive for palpitations.  Gastrointestinal: Positive for vomiting.  Musculoskeletal: Negative.   Skin: Negative.   Neurological: Negative.  Negative for syncope.       Paresthesia  Psychiatric/Behavioral: Negative for confusion.    Physical Exam Updated Vital Signs BP 124/79   Pulse 68   Temp (!) 97.4 F (36.3 C) (Oral)   Resp 20   Ht 5\' 10"  (1.778 m)   Wt 120.7 kg   SpO2 93%   BMI 38.17 kg/m   Physical Exam Vitals and nursing note reviewed.  Constitutional:      Appearance: He is well-developed.  HENT:     Head: Normocephalic.  Cardiovascular:     Rate and Rhythm: Normal rate and regular rhythm.     Heart sounds: No murmur heard.   Pulmonary:     Effort: Pulmonary effort is normal.     Breath sounds: Normal breath sounds. No wheezing, rhonchi or rales.  Chest:     Chest wall: No tenderness.  Abdominal:     General:  Bowel sounds are normal.     Palpations: Abdomen is soft.     Tenderness: There is no abdominal tenderness. There is no guarding or rebound.  Musculoskeletal:        General: Normal range of motion.     Cervical back: Normal range of motion and neck supple.     Right lower leg: No edema.     Left lower leg: No edema.  Skin:    General: Skin is warm and dry.     Findings: No rash.  Neurological:     Mental Status: He is alert and oriented to person, place, and time.     Sensory: No sensory deficit.     ED Results / Procedures /  Treatments   Labs (all labs ordered are listed, but only abnormal results are displayed) Labs Reviewed  CBC WITH DIFFERENTIAL/PLATELET - Abnormal; Notable for the following components:      Result Value   Monocytes Absolute 1.3 (*)    All other components within normal limits  COMPREHENSIVE METABOLIC PANEL - Abnormal; Notable for the following components:   Glucose, Bld 126 (*)    All other components within normal limits  TROPONIN I (HIGH SENSITIVITY)  TROPONIN I (HIGH SENSITIVITY)    EKG EKG Interpretation  Date/Time:  Saturday Apr 29 2021 00:19:30 EDT Ventricular Rate:  68 PR Interval:  162 QRS Duration: 84 QT Interval:  382 QTC Calculation: 406 R Axis:   -8 Text Interpretation: Normal sinus rhythm Normal ECG No old tracing to compare Confirmed by Drema Pry [phone]) on 04/29/2021 6:28:48 AM   Radiology DG Chest 2 View  Result Date: 04/29/2021 CLINICAL DATA:  Shortness of breath patient reports having an anxiety attack EXAM: CHEST - 2 VIEW COMPARISON:  September 23, 2006 FINDINGS: Borderline cardiac enlargement. Low lung volumes with bibasilar atelectasis. No pleural effusion or pneumothorax. Thoracic spondylosis. IMPRESSION: Borderline cardiac enlargement. Low lung volumes with bibasilar atelectasis. Electronically Signed   By: Maudry Mayhew MD   On: 04/29/2021 01:12    Procedures Procedures   Medications Ordered in ED Medications - No  data to display  ED Course  I have reviewed the triage vital signs and the nursing notes.  Pertinent labs & imaging results that were available during my care of the patient were reviewed by me and considered in my medical decision making (see chart for details).    MDM Rules/Calculators/A&P                          Patient to ED with ss/sxs as detailed in the HPI.  Labs reviewed, including CBC, CMET, Troponin x 2 and are negative for abnormality. EKG NSR. CXR clear. Symptoms are persistent but not as intense as on arrival.   Discussed at length the DDx: Anxiety vs potential sleep apnea vs pulmonary origin given dx of viral induced asthma. Patient and wife reassured.   He is felt appropriate for discharge home. Return precautions discussed.   Final Clinical Impression(s) / ED Diagnoses Final diagnoses:  None   1. Restlessness and agitation  Rx / DC Orders ED Discharge Orders    None       Danne Harbor 04/30/21 0820    Nira Conn, MD 05/01/21 2118

## 2021-04-29 NOTE — ED Triage Notes (Signed)
Patient reports "what he thinks an anxiety attack", emesis x 4, states he is unable to sleep, also with shortness of breath

## 2021-04-29 NOTE — Discharge Instructions (Signed)
Your tests here are reassuring and do not show any evidence of a serious condition or infection.   Follow up with Dr. Waynard Edwards soon to discuss further outpatient work up.   Return to the emergency department if you have any new or worsening symptoms.

## 2021-05-05 ENCOUNTER — Other Ambulatory Visit: Payer: Self-pay | Admitting: Internal Medicine

## 2021-05-05 DIAGNOSIS — R0789 Other chest pain: Secondary | ICD-10-CM

## 2021-05-05 DIAGNOSIS — R062 Wheezing: Secondary | ICD-10-CM

## 2021-05-11 ENCOUNTER — Encounter: Payer: Self-pay | Admitting: Psychiatry

## 2021-05-11 ENCOUNTER — Ambulatory Visit (INDEPENDENT_AMBULATORY_CARE_PROVIDER_SITE_OTHER): Payer: Self-pay | Admitting: Psychiatry

## 2021-05-11 ENCOUNTER — Other Ambulatory Visit: Payer: Self-pay

## 2021-05-11 ENCOUNTER — Ambulatory Visit
Admission: RE | Admit: 2021-05-11 | Discharge: 2021-05-11 | Disposition: A | Discharge status: Home / Self Care | Payer: Self-pay | Source: Ambulatory Visit | Attending: Internal Medicine | Admitting: Internal Medicine

## 2021-05-11 VITALS — BP 124/82 | HR 55

## 2021-05-11 DIAGNOSIS — F39 Unspecified mood [affective] disorder: Secondary | ICD-10-CM

## 2021-05-11 DIAGNOSIS — F900 Attention-deficit hyperactivity disorder, predominantly inattentive type: Secondary | ICD-10-CM

## 2021-05-11 DIAGNOSIS — F410 Panic disorder [episodic paroxysmal anxiety] without agoraphobia: Secondary | ICD-10-CM

## 2021-05-11 DIAGNOSIS — G2581 Restless legs syndrome: Secondary | ICD-10-CM

## 2021-05-11 DIAGNOSIS — R062 Wheezing: Secondary | ICD-10-CM

## 2021-05-11 DIAGNOSIS — G4700 Insomnia, unspecified: Secondary | ICD-10-CM

## 2021-05-11 DIAGNOSIS — R0789 Other chest pain: Secondary | ICD-10-CM

## 2021-05-11 DIAGNOSIS — F419 Anxiety disorder, unspecified: Secondary | ICD-10-CM

## 2021-05-11 MED ORDER — ESCITALOPRAM OXALATE 20 MG PO TABS: 20.0000 mg | ORAL_TABLET | Freq: Every day | ORAL | 1 refills | Status: DC

## 2021-05-11 MED ORDER — CONCERTA 36 MG PO TBCR: 36.0000 mg | EXTENDED_RELEASE_TABLET | Freq: Every day | ORAL | 0 refills | Status: DC

## 2021-05-11 MED ORDER — METHYLPHENIDATE HCL 10 MG PO TABS: 10.0000 mg | ORAL_TABLET | Freq: Every day | ORAL | 0 refills | Status: DC | PRN

## 2021-05-11 MED ORDER — METHYLPHENIDATE HCL 10 MG PO TABS: 10.0000 mg | ORAL_TABLET | Freq: Every day | ORAL | 0 refills | Status: DC

## 2021-05-11 MED ORDER — IOPAMIDOL (ISOVUE-300) INJECTION 61%
75.0000 mL | Freq: Once | INTRAVENOUS | Status: AC | PRN
  Administered 2021-05-11: 75 mL via INTRAVENOUS

## 2021-05-11 MED ORDER — CLONAZEPAM 0.5 MG PO TABS: 0.5000 mg | ORAL_TABLET | Freq: Every day | ORAL | 2 refills | Status: DC

## 2021-05-11 NOTE — Progress Notes (Signed)
Timothy Camacho [id number] [birth year] 60 y.o.  Subjective:   Patient ID:  Timothy Camacho is a 60 y.o. (DOB Apr 25, 1961) male.  Chief Complaint:  Chief Complaint  Patient presents with  . Anxiety  . Sleeping Problem    HPI Timothy Camacho presents to the office today for follow-up of anxiety, insomnia, ADHD, and sleep disturbance. He reports that he has difficulty breathing at night and his legs will get restless, "almost like a panic attack." He was awake all night and the following night was "worse" to the point of vomiting and went to the ER. He reports that he has continued to have shortness of breath with lying down. He f/u with PCP's office and had cough and fever. He was told he was negative for COVID and flu. He was later prescribed 5-days of steroids and reports that this was helpful. He reports that he has restless legs when he is short of breath.   He reports that he resigned from his job and plans to return to previous job on June 6th. He reports that he had stress with several jobs. He reports that anxiety is ok "when life is decent" and that anxiety manifests when stressors are increased. He reports that he has had some panic attacks and will wake up gasping. He falls asleep quickly and has multiple awakenings throughout the night. He reports that there are days that his mood is lower in response to situational stressors. He reports that he has periods where mood is improved. He reports that he is constantly trying to improve upon things and do it as efficiently as possible. He reports that he is "burdened" if steps are skipped or things are not done properly. He reports that he has to plan and prepare with all tasks. Energy is up and down.   He reports that brand name Concerta was much more effective for him compared to generic. He reports that generic seems to be minimally effective. He reports that Methylphenidate immediate release seems to be minimally effective.   Denies SI.    Has not had a sleep study. He reports snoring some. He wakes up gasping some. No observed apneic episodes.   Past Medication Trials: Lexapro Celexa Wellbutrin XL Olanzapine Risperdal Trileptal Lamictal Gabapentin Topamax Naltrexone Klonopin NAC Concerta Adhansia- intrusive thoughts, grinding teeth Methylphenidate Propranolol   Flowsheet Row ED from 04/29/2021 in [facility] EMERGENCY DEPARTMENT  C-SSRS RISK CATEGORY No Risk       Review of Systems:  Review of Systems  Respiratory: Positive for cough and shortness of breath. Negative for chest tightness.        He reports that pulse ox has been going into the 80's-90's at times.   Cardiovascular: Negative for chest pain and palpitations.  Musculoskeletal: Negative for gait problem.  Psychiatric/Behavioral:       Please refer to HPI    Medications: I have reviewed the patient's current medications.  Current Outpatient Medications  Medication Sig Dispense Refill  . acetaminophen (TYLENOL) 500 MG tablet Take 500 mg by mouth every 6 (six) hours as needed (pain).    Marland Kitchen allopurinol (ZYLOPRIM) 300 MG tablet Take 300 mg by mouth daily.    . Cholecalciferol (VITAMIN D) 2000 units CAPS Take by mouth.    . clonazePAM (KLONOPIN) 0.5 MG tablet Take 1 tablet (0.5 mg total) by mouth at bedtime. 30 tablet 2  . [START ON 07/20/2021] CONCERTA 36 MG CR tablet Take 1 tablet (36 mg total) by  mouth daily. 30 tablet 0  . Cyanocobalamin (VITAMIN B-12) 1000 MCG/15ML LIQD Take 15 mLs by mouth daily.    Marland Kitchen escitalopram (LEXAPRO) 10 MG tablet Take 1 tablet (10 mg total) by mouth daily. (Patient taking differently: Take 20 mg by mouth daily.) 90 tablet 1  . escitalopram (LEXAPRO) 20 MG tablet Take 1 tablet (20 mg total) by mouth daily. 90 tablet 1  . ibuprofen (ADVIL,MOTRIN) 200 MG tablet Take 800 mg by mouth every 6 (six) hours as needed for headache or moderate pain.    Melene Muller ON 07/20/2021] methylphenidate (RITALIN) 10 MG tablet  Take 1 tablet (10 mg total) by mouth daily as needed. 30 tablet 0  . propranolol (INDERAL) 10 MG tablet Take 1 tablet (10 mg total) by mouth 4 (four) times daily as needed (palpitations, fast heart rate, or anxiety). 60 tablet 6  . [START ON 06/22/2021] CONCERTA 36 MG CR tablet Take 1 tablet (36 mg total) by mouth daily. 30 tablet 0  . [START ON 05/25/2021] CONCERTA 36 MG CR tablet Take 1 tablet (36 mg total) by mouth daily. 30 tablet 0  . hydrochlorothiazide (HYDRODIURIL) 25 MG tablet Take 1 tablet (25 mg total) by mouth daily. (Patient not taking: Reported on 05/11/2021) 30 tablet 11  . [START ON 06/22/2021] methylphenidate (RITALIN) 10 MG tablet Take 1 tablet (10 mg total) by mouth daily as needed. 30 tablet 0  . [START ON 05/25/2021] methylphenidate (RITALIN) 10 MG tablet Take 1 tablet (10 mg total) by mouth daily. 30 tablet 0  . potassium chloride (KLOR-CON) 10 MEQ tablet Take 1 tablet (10 mEq total) by mouth daily. (Patient not taking: Reported on 05/11/2021) 30 tablet 11  . pramipexole (MIRAPEX) 0.5 MG tablet TAKE 1 & 1/2 TABLETS (0.75 MG TOTAL) BY MOUTH EVERY EVENING. (Patient taking differently: Take 0.75 mg by mouth every evening.) 135 tablet 1  . Pyridoxine HCl (VITAMIN B-6 PO) Take by mouth.      No current facility-administered medications for this visit.    Medication Side Effects: None  Allergies:  Allergies  Allergen Reactions  . Risperidone And Related     intolerance  . Trileptal [Oxcarbazepine]     intolerance    Past Medical History:  Diagnosis Date  . Gout   . History of peritonsillar abscess   . RLS (restless legs syndrome)     Past Medical History, Surgical history, Social history, and Family history were reviewed and updated as appropriate.   Please see review of systems for further details on the patient's review from today.   Objective:   Physical Exam:  BP 124/82   Pulse (!) 55   Physical Exam Constitutional:      General: He is not in acute  distress. Musculoskeletal:        General: No deformity.  Neurological:     Mental Status: He is alert and oriented to person, place, and time.     Coordination: Coordination normal.  Psychiatric:        Attention and Perception: Attention and perception normal. He does not perceive auditory or visual hallucinations.        Mood and Affect: Mood is anxious. Affect is not labile, blunt, angry or inappropriate.        Speech: Speech normal.        Behavior: Behavior normal.        Thought Content: Thought content normal. Thought content is not paranoid or delusional. Thought content does not include homicidal or suicidal ideation.  Thought content does not include homicidal or suicidal plan.        Cognition and Memory: Cognition and memory normal.        Judgment: Judgment normal.     Comments: Insight intact Dysphoric mood     Lab Review:     Component Value Date/Time   NA 139 04/29/2021 0026   NA 143 06/28/2020 1556   K 4.5 04/29/2021 0026   CL 105 04/29/2021 0026   CO2 29 04/29/2021 0026   GLUCOSE 126 (H) 04/29/2021 0026   BUN 15 04/29/2021 0026   BUN 15 06/28/2020 1556   CREATININE 1.11 04/29/2021 0026   CALCIUM 9.0 04/29/2021 0026   PROT 7.1 04/29/2021 0026   PROT 7.1 06/07/2020 1659   ALBUMIN 3.8 04/29/2021 0026   ALBUMIN 4.5 06/07/2020 1659   AST 33 04/29/2021 0026   ALT 33 04/29/2021 0026   ALKPHOS 45 04/29/2021 0026   BILITOT 0.6 04/29/2021 0026   BILITOT 0.3 06/07/2020 1659   GFRNONAA >60 04/29/2021 0026   GFRAA 93 06/28/2020 1556       Component Value Date/Time   WBC 9.7 04/29/2021 0026   RBC 5.17 04/29/2021 0026   HGB 15.8 04/29/2021 0026   HGB 14.7 06/07/2020 1659   HCT 47.5 04/29/2021 0026   HCT 42.8 06/07/2020 1659   PLT 262 04/29/2021 0026   PLT 265 06/07/2020 1659   MCV 91.9 04/29/2021 0026   MCV 92 06/07/2020 1659   MCH 30.6 04/29/2021 0026   MCHC 33.3 04/29/2021 0026   RDW 14.2 04/29/2021 0026   RDW 13.4 06/07/2020 1659   LYMPHSABS 2.4  04/29/2021 0026   MONOABS 1.3 (H) 04/29/2021 0026   EOSABS 0.4 04/29/2021 0026   BASOSABS 0.1 04/29/2021 0026    No results found for: POCLITH, LITHIUM   No results found for: PHENYTOIN, PHENOBARB, VALPROATE, CBMZ   .res Assessment: Plan:    Will restart low-dose Klonopin 0.5 mg at bedtime to improve insomnia, restless legs, and possible nocturnal panic attacks. Agree with plan to follow-up with medical providers to evaluate for medical causes of shortness of breath, cough, and chest tightness. Continue Concerta 36 mg in the morning for attention deficit disorder. Continue methylphenidate 10 mg as needed for attention deficit disorder. Continue Lexapro 20 mg daily for mood and anxiety signs and symptoms. Continue propanolol as needed for anxiety. Patient to follow-up in 3 months or sooner if clinically indicated. Patient advised to contact office with any questions, adverse effects, or acute worsening in signs and symptoms.   Jerrod was seen today for anxiety and sleeping problem.  Diagnoses and all orders for this visit:  RLS (restless legs syndrome) -     clonazePAM (KLONOPIN) 0.5 MG tablet; Take 1 tablet (0.5 mg total) by mouth at bedtime.  Panic disorder -     clonazePAM (KLONOPIN) 0.5 MG tablet; Take 1 tablet (0.5 mg total) by mouth at bedtime.  Insomnia, unspecified type -     clonazePAM (KLONOPIN) 0.5 MG tablet; Take 1 tablet (0.5 mg total) by mouth at bedtime.  Attention deficit hyperactivity disorder (ADHD), predominantly inattentive type -     CONCERTA 36 MG CR tablet; Take 1 tablet (36 mg total) by mouth daily. -     methylphenidate (RITALIN) 10 MG tablet; Take 1 tablet (10 mg total) by mouth daily as needed. -     CONCERTA 36 MG CR tablet; Take 1 tablet (36 mg total) by mouth daily. -     methylphenidate (RITALIN)  10 MG tablet; Take 1 tablet (10 mg total) by mouth daily. -     CONCERTA 36 MG CR tablet; Take 1 tablet (36 mg total) by mouth daily. -      methylphenidate (RITALIN) 10 MG tablet; Take 1 tablet (10 mg total) by mouth daily as needed.  Anxiety disorder, unspecified type -     escitalopram (LEXAPRO) 20 MG tablet; Take 1 tablet (20 mg total) by mouth daily.  Mild mood disorder (HCC) -     escitalopram (LEXAPRO) 20 MG tablet; Take 1 tablet (20 mg total) by mouth daily.     Please see After Visit Summary for patient specific instructions.  Future Appointments  Date Time Provider Department Center  08/08/2021  9:00 AM Corie Chiquitoarter, Addisen Chappelle, PMHNP CP-CP None    No orders of the defined types were placed in this encounter.   -------------------------------

## 2021-06-30 ENCOUNTER — Other Ambulatory Visit: Payer: Self-pay | Admitting: Psychiatry

## 2021-06-30 DIAGNOSIS — F419 Anxiety disorder, unspecified: Secondary | ICD-10-CM

## 2021-08-02 ENCOUNTER — Telehealth: Payer: Self-pay | Admitting: Psychiatry

## 2021-08-02 NOTE — Telephone Encounter (Signed)
[facility] @ [facility] [address] called. Rx for Concerta 36 mg Shows brand and insurance only pays for generic.  Pt agreed he'd take generic until provider can contact insurance.  Return call [phone] [facility] @ [facility]

## 2021-08-03 ENCOUNTER — Telehealth: Payer: Self-pay

## 2021-08-03 NOTE — Telephone Encounter (Signed)
Pending PA for Brand

## 2021-08-03 NOTE — Telephone Encounter (Signed)
In the past he had requested brand name since one generic manufacturer was ineffective.

## 2021-08-03 NOTE — Telephone Encounter (Signed)
Does pt require BRAND? If so I can submit PA. Looks like he's been getting generic as of 7/16.

## 2021-08-04 NOTE — Telephone Encounter (Signed)
Prior Authorization DENIED for Brand Concerta    The denial was based on our criteria for Concerta Tab 36mg .  Per your health plan's criteria, this drug is covered if you meet the following: You have tried or cannot use at least two drugs from the following: (a) Adzenys XR-ODT or Dyanavel XR*. (b) Atomoxetine. (c) Amphetamine/dextroamphetamine XR or Mydayis*. (d) Dexmethylphenidate ER*. (e) Vyvanse*.

## 2021-08-07 NOTE — Telephone Encounter (Signed)
Rtc to pt but he's coming Tuesday for apt and everything can be discussed then. He has not received the denial letter yet.

## 2021-08-08 ENCOUNTER — Ambulatory Visit (INDEPENDENT_AMBULATORY_CARE_PROVIDER_SITE_OTHER): Payer: BC Managed Care – PPO | Admitting: Psychiatry

## 2021-08-08 ENCOUNTER — Other Ambulatory Visit: Payer: Self-pay

## 2021-08-08 ENCOUNTER — Encounter: Payer: Self-pay | Admitting: Psychiatry

## 2021-08-08 DIAGNOSIS — G4700 Insomnia, unspecified: Secondary | ICD-10-CM

## 2021-08-08 DIAGNOSIS — F419 Anxiety disorder, unspecified: Secondary | ICD-10-CM

## 2021-08-08 DIAGNOSIS — F410 Panic disorder [episodic paroxysmal anxiety] without agoraphobia: Secondary | ICD-10-CM | POA: Diagnosis not present

## 2021-08-08 DIAGNOSIS — F39 Unspecified mood [affective] disorder: Secondary | ICD-10-CM

## 2021-08-08 DIAGNOSIS — G2581 Restless legs syndrome: Secondary | ICD-10-CM | POA: Diagnosis not present

## 2021-08-08 DIAGNOSIS — F900 Attention-deficit hyperactivity disorder, predominantly inattentive type: Secondary | ICD-10-CM

## 2021-08-08 MED ORDER — METHYLPHENIDATE HCL 20 MG PO TABS: 20.0000 mg | ORAL_TABLET | Freq: Two times a day (BID) | ORAL | 0 refills | Status: DC

## 2021-08-08 MED ORDER — ESCITALOPRAM OXALATE 20 MG PO TABS
20.0000 mg | ORAL_TABLET | Freq: Every day | ORAL | 1 refills | Status: DC
Start: 2021-08-08 — End: 2021-12-01

## 2021-08-08 MED ORDER — PROPRANOLOL HCL 10 MG PO TABS: ORAL_TABLET | ORAL | 0 refills | Status: DC

## 2021-08-08 MED ORDER — CLONAZEPAM 0.5 MG PO TABS
0.5000 mg | ORAL_TABLET | Freq: Every day | ORAL | 3 refills | Status: DC
Start: 2021-08-08 — End: 2021-12-13

## 2021-08-08 MED ORDER — PRAMIPEXOLE DIHYDROCHLORIDE 0.5 MG PO TABS: 0.7500 mg | ORAL_TABLET | Freq: Every evening | ORAL | 1 refills | Status: DC

## 2021-08-08 MED ORDER — METHYLPHENIDATE HCL 20 MG PO TABS
20.0000 mg | ORAL_TABLET | Freq: Two times a day (BID) | ORAL | 0 refills | Status: DC
Start: 2021-09-05 — End: 2021-12-25

## 2021-08-08 NOTE — Progress Notes (Signed)
Timothy Camacho [id number] [birth year] 60 y.o.  Subjective:   Patient ID:  Timothy Camacho is a 60 y.o. (DOB 11/21/1961) male.  Chief Complaint:  Chief Complaint  Patient presents with   Follow-up    ADHD, anxiety, depression    HPI Timothy Camacho presents to the office today for follow-up of anxiety, ADHD, and insomnia. He reports that brand name Concerta is now $400 and this is cost-prohibitive. He has been taking Concerta 36 mg in the morning and then Ritalin 10 mg in the afternoon on work days  He tried Ritalin 20 mg BID when he could not fill Concerta. He reports that this has been significantly more effective for him compared to Concerta. He reports that he feels more focused, energized, and able to effectively problem solve without becoming overwhelmed. He reports that generic Concerta had inconsistent response.   "I feel like I did years ago." He reports that he no longer feels as overwhelmed in response to stressors and different events. He reports that anxiety has been more manageable this past week when taking Ritalin BID. He reports, "there are days I have frustrations" and denies any irritation or sadness without trigger. He reports energy and motivation have been improved with getting adequate sleep. Appetite has been good. Denies SI.   He reports that his sleep has significantly improved with Klonopin and not experiencing wake-ups. He has been taking Pramipexole earlier in the evenings when his legs start to become restless.    Past Medication Trials: Lexapro Celexa Wellbutrin XL Olanzapine Risperdal Trileptal Lamictal Gabapentin Topamax Naltrexone Klonopin NAC Concerta Adhansia- intrusive thoughts, grinding teeth Methylphenidate Propranolol  Flowsheet Row ED from 04/29/2021 in [facility] EMERGENCY DEPARTMENT  C-SSRS RISK CATEGORY No Risk        Review of Systems:  Review of Systems  Respiratory:  Negative for shortness of breath.    Cardiovascular:  Negative for palpitations.  Musculoskeletal:  Negative for gait problem.  Psychiatric/Behavioral:         Please refer to HPI   Medications: I have reviewed the patient's current medications.  Current Outpatient Medications  Medication Sig Dispense Refill   acetaminophen (TYLENOL) 500 MG tablet Take 500 mg by mouth every 6 (six) hours as needed (pain).     allopurinol (ZYLOPRIM) 300 MG tablet Take 300 mg by mouth daily.     Cholecalciferol (VITAMIN D) 2000 units CAPS Take by mouth.     hydrochlorothiazide (HYDRODIURIL) 25 MG tablet Take 1 tablet (25 mg total) by mouth daily. 30 tablet 11   [START ON 09/05/2021] methylphenidate (RITALIN) 20 MG tablet Take 1 tablet (20 mg total) by mouth 2 (two) times daily. 60 tablet 0   [START ON 10/03/2021] methylphenidate (RITALIN) 20 MG tablet Take 1 tablet (20 mg total) by mouth 2 (two) times daily. 60 tablet 0   Multiple Vitamin (MULTIVITAMIN) tablet Take 1 tablet by mouth daily.     potassium chloride (KLOR-CON) 10 MEQ tablet Take 1 tablet (10 mEq total) by mouth daily. 30 tablet 11   clonazePAM (KLONOPIN) 0.5 MG tablet Take 1 tablet (0.5 mg total) by mouth at bedtime. 30 tablet 3   escitalopram (LEXAPRO) 20 MG tablet Take 1 tablet (20 mg total) by mouth daily. 90 tablet 1   ibuprofen (ADVIL,MOTRIN) 200 MG tablet Take 800 mg by mouth every 6 (six) hours as needed for headache or moderate pain.     methylphenidate (RITALIN) 20 MG tablet Take 1 tablet (20 mg total) by  mouth 2 (two) times daily with breakfast and lunch. 60 tablet 0   pramipexole (MIRAPEX) 0.5 MG tablet Take 1.5 tablets (0.75 mg total) by mouth every evening. 90 tablet 1   propranolol (INDERAL) 10 MG tablet TAKE 1 TO 2 TABLETS BY MOUTH TWICE A DAY AS NEEDED FOR ANXIETY 120 tablet 0   No current facility-administered medications for this visit.    Medication Side Effects: None  Allergies:  Allergies  Allergen Reactions   Risperidone And Related     intolerance    Trileptal [Oxcarbazepine]     intolerance    Past Medical History:  Diagnosis Date   Gout    History of peritonsillar abscess    RLS (restless legs syndrome)     Past Medical History, Surgical history, Social history, and Family history were reviewed and updated as appropriate.   Please see review of systems for further details on the patient's review from today.   Objective:   Physical Exam:  BP 118/75   Pulse 67   Physical Exam Constitutional:      General: He is not in acute distress. Musculoskeletal:        General: No deformity.  Neurological:     Mental Status: He is alert and oriented to person, place, and time.     Coordination: Coordination normal.  Psychiatric:        Attention and Perception: Attention and perception normal. He does not perceive auditory or visual hallucinations.        Mood and Affect: Mood normal. Mood is not anxious or depressed. Affect is not labile, blunt, angry or inappropriate.        Speech: Speech normal.        Behavior: Behavior normal.        Thought Content: Thought content normal. Thought content is not paranoid or delusional. Thought content does not include homicidal or suicidal ideation. Thought content does not include homicidal or suicidal plan.        Cognition and Memory: Cognition and memory normal.        Judgment: Judgment normal.     Comments: Insight intact    Lab Review:     Component Value Date/Time   NA 139 04/29/2021 0026   NA 143 06/28/2020 1556   K 4.5 04/29/2021 0026   CL 105 04/29/2021 0026   CO2 29 04/29/2021 0026   GLUCOSE 126 (H) 04/29/2021 0026   BUN 15 04/29/2021 0026   BUN 15 06/28/2020 1556   CREATININE 1.11 04/29/2021 0026   CALCIUM 9.0 04/29/2021 0026   PROT 7.1 04/29/2021 0026   PROT 7.1 06/07/2020 1659   ALBUMIN 3.8 04/29/2021 0026   ALBUMIN 4.5 06/07/2020 1659   AST 33 04/29/2021 0026   ALT 33 04/29/2021 0026   ALKPHOS 45 04/29/2021 0026   BILITOT 0.6 04/29/2021 0026   BILITOT 0.3  06/07/2020 1659   GFRNONAA >60 04/29/2021 0026   GFRAA 93 06/28/2020 1556       Component Value Date/Time   WBC 9.7 04/29/2021 0026   RBC 5.17 04/29/2021 0026   HGB 15.8 04/29/2021 0026   HGB 14.7 06/07/2020 1659   HCT 47.5 04/29/2021 0026   HCT 42.8 06/07/2020 1659   PLT 262 04/29/2021 0026   PLT 265 06/07/2020 1659   MCV 91.9 04/29/2021 0026   MCV 92 06/07/2020 1659   MCH 30.6 04/29/2021 0026   MCHC 33.3 04/29/2021 0026   RDW 14.2 04/29/2021 0026   RDW 13.4 06/07/2020 1659  LYMPHSABS 2.4 04/29/2021 0026   MONOABS 1.3 (H) 04/29/2021 0026   EOSABS 0.4 04/29/2021 0026   BASOSABS 0.1 04/29/2021 0026    No results found for: POCLITH, LITHIUM   No results found for: PHENYTOIN, PHENOBARB, VALPROATE, CBMZ   .res Assessment: Plan:   Will change Concerta 36 mg po q am and Ritalin 10 mg po q afternoon to Ritalin 20 mg po BID since this has been most effective for ADHD s/s. Continue Lexapro 20 mg po qd for mood and anxiety.  Continue Pramipexole 0.75 mg po q evening for RLS.  Continue Klonopin 0.5 mg po QHS for anxiety and RLS.  Continue Propranolol prn anxiety.  Pt to follow-up in 3 months or sooner if clinically indicated.  Patient advised to contact office with any questions, adverse effects, or acute worsening in signs and symptoms.   Timothy Camacho was seen today for follow-up.  Diagnoses and all orders for this visit:  Anxiety disorder, unspecified type -     escitalopram (LEXAPRO) 20 MG tablet; Take 1 tablet (20 mg total) by mouth daily. -     propranolol (INDERAL) 10 MG tablet; TAKE 1 TO 2 TABLETS BY MOUTH TWICE A DAY AS NEEDED FOR ANXIETY  Mild mood disorder (HCC) -     escitalopram (LEXAPRO) 20 MG tablet; Take 1 tablet (20 mg total) by mouth daily.  RLS (restless legs syndrome) -     clonazePAM (KLONOPIN) 0.5 MG tablet; Take 1 tablet (0.5 mg total) by mouth at bedtime. -     pramipexole (MIRAPEX) 0.5 MG tablet; Take 1.5 tablets (0.75 mg total) by mouth every  evening.  Panic disorder -     clonazePAM (KLONOPIN) 0.5 MG tablet; Take 1 tablet (0.5 mg total) by mouth at bedtime.  Insomnia, unspecified type -     clonazePAM (KLONOPIN) 0.5 MG tablet; Take 1 tablet (0.5 mg total) by mouth at bedtime.  Attention deficit hyperactivity disorder (ADHD), predominantly inattentive type -     methylphenidate (RITALIN) 20 MG tablet; Take 1 tablet (20 mg total) by mouth 2 (two) times daily with breakfast and lunch. -     methylphenidate (RITALIN) 20 MG tablet; Take 1 tablet (20 mg total) by mouth 2 (two) times daily. -     methylphenidate (RITALIN) 20 MG tablet; Take 1 tablet (20 mg total) by mouth 2 (two) times daily.    Please see After Visit Summary for patient specific instructions.  Future Appointments  Date Time Provider Department Center  08/14/2021  9:00 AM Huston Foley, MD [facility] None  11/02/2021  9:00 AM Corie Chiquito, PM[facility]NP [facility] None    No orders of the defined types were placed in this encounter.   -------------------------------

## 2021-08-14 ENCOUNTER — Institutional Professional Consult (permissible substitution): Payer: Self-pay | Admitting: Neurology

## 2021-10-18 NOTE — Telephone Encounter (Signed)
Error for refill request

## 2021-11-02 ENCOUNTER — Ambulatory Visit: Payer: Self-pay | Admitting: Psychiatry

## 2021-11-30 ENCOUNTER — Other Ambulatory Visit: Payer: Self-pay | Admitting: Psychiatry

## 2021-11-30 DIAGNOSIS — F419 Anxiety disorder, unspecified: Secondary | ICD-10-CM

## 2021-11-30 DIAGNOSIS — F39 Unspecified mood [affective] disorder: Secondary | ICD-10-CM

## 2021-12-01 ENCOUNTER — Other Ambulatory Visit: Payer: Self-pay

## 2021-12-01 ENCOUNTER — Telehealth: Payer: Self-pay | Admitting: Psychiatry

## 2021-12-01 DIAGNOSIS — Z125 Encounter for screening for malignant neoplasm of prostate: Secondary | ICD-10-CM | POA: Diagnosis not present

## 2021-12-01 DIAGNOSIS — F900 Attention-deficit hyperactivity disorder, predominantly inattentive type: Secondary | ICD-10-CM

## 2021-12-01 DIAGNOSIS — R7301 Impaired fasting glucose: Secondary | ICD-10-CM | POA: Diagnosis not present

## 2021-12-01 DIAGNOSIS — F39 Unspecified mood [affective] disorder: Secondary | ICD-10-CM

## 2021-12-01 DIAGNOSIS — M109 Gout, unspecified: Secondary | ICD-10-CM | POA: Diagnosis not present

## 2021-12-01 DIAGNOSIS — E559 Vitamin D deficiency, unspecified: Secondary | ICD-10-CM | POA: Diagnosis not present

## 2021-12-01 DIAGNOSIS — I700 Atherosclerosis of aorta: Secondary | ICD-10-CM | POA: Diagnosis not present

## 2021-12-01 DIAGNOSIS — F419 Anxiety disorder, unspecified: Secondary | ICD-10-CM

## 2021-12-01 NOTE — Telephone Encounter (Signed)
Pended.

## 2021-12-01 NOTE — Telephone Encounter (Signed)
Pt would like refill of Klonopin, Lexipro and Methylphenidate to [facility] [address]   Next appt 2/1

## 2021-12-02 MED ORDER — METHYLPHENIDATE HCL 20 MG PO TABS: 20.0000 mg | ORAL_TABLET | Freq: Two times a day (BID) | ORAL | 0 refills | Status: DC

## 2021-12-02 MED ORDER — ESCITALOPRAM OXALATE 20 MG PO TABS: 20.0000 mg | ORAL_TABLET | Freq: Every day | ORAL | 0 refills | Status: DC

## 2021-12-07 DIAGNOSIS — Z23 Encounter for immunization: Secondary | ICD-10-CM | POA: Diagnosis not present

## 2021-12-07 DIAGNOSIS — Z0000 Encounter for general adult medical examination without abnormal findings: Secondary | ICD-10-CM | POA: Diagnosis not present

## 2021-12-07 DIAGNOSIS — R82998 Other abnormal findings in urine: Secondary | ICD-10-CM | POA: Diagnosis not present

## 2021-12-07 DIAGNOSIS — Z1331 Encounter for screening for depression: Secondary | ICD-10-CM | POA: Diagnosis not present

## 2021-12-07 DIAGNOSIS — Z1389 Encounter for screening for other disorder: Secondary | ICD-10-CM | POA: Diagnosis not present

## 2021-12-07 DIAGNOSIS — E559 Vitamin D deficiency, unspecified: Secondary | ICD-10-CM | POA: Diagnosis not present

## 2021-12-08 ENCOUNTER — Other Ambulatory Visit: Payer: Self-pay | Admitting: Psychiatry

## 2021-12-08 DIAGNOSIS — G4700 Insomnia, unspecified: Secondary | ICD-10-CM

## 2021-12-08 DIAGNOSIS — G2581 Restless legs syndrome: Secondary | ICD-10-CM

## 2021-12-08 DIAGNOSIS — F410 Panic disorder [episodic paroxysmal anxiety] without agoraphobia: Secondary | ICD-10-CM

## 2021-12-13 NOTE — Telephone Encounter (Signed)
°  Pt left a message for refill on his klonopin. He has been out for 7 days. He asked on 12/16 for this same medicine. P_lease send to the pharmacy

## 2021-12-14 ENCOUNTER — Telehealth: Payer: Self-pay | Admitting: Psychiatry

## 2021-12-14 NOTE — Telephone Encounter (Signed)
LVM to rtc 

## 2021-12-14 NOTE — Telephone Encounter (Signed)
Pt called reporting he may need med change/adjustment. Provider out until 12/19/21. Pt put on canc list. Has apt 2/1. Also, ask nurse to call pt back @ [phone] for more details.

## 2021-12-24 ENCOUNTER — Other Ambulatory Visit: Payer: Self-pay | Admitting: Psychiatry

## 2021-12-24 DIAGNOSIS — G2581 Restless legs syndrome: Secondary | ICD-10-CM

## 2021-12-25 ENCOUNTER — Encounter: Payer: Self-pay | Admitting: Psychiatry

## 2021-12-25 ENCOUNTER — Other Ambulatory Visit: Payer: Self-pay

## 2021-12-25 ENCOUNTER — Ambulatory Visit (INDEPENDENT_AMBULATORY_CARE_PROVIDER_SITE_OTHER): Payer: BC Managed Care – PPO | Admitting: Psychiatry

## 2021-12-25 DIAGNOSIS — G4700 Insomnia, unspecified: Secondary | ICD-10-CM | POA: Diagnosis not present

## 2021-12-25 DIAGNOSIS — F410 Panic disorder [episodic paroxysmal anxiety] without agoraphobia: Secondary | ICD-10-CM | POA: Diagnosis not present

## 2021-12-25 DIAGNOSIS — G2581 Restless legs syndrome: Secondary | ICD-10-CM

## 2021-12-25 DIAGNOSIS — F900 Attention-deficit hyperactivity disorder, predominantly inattentive type: Secondary | ICD-10-CM

## 2021-12-25 DIAGNOSIS — F39 Unspecified mood [affective] disorder: Secondary | ICD-10-CM

## 2021-12-25 DIAGNOSIS — F419 Anxiety disorder, unspecified: Secondary | ICD-10-CM | POA: Diagnosis not present

## 2021-12-25 MED ORDER — PRAMIPEXOLE DIHYDROCHLORIDE 0.5 MG PO TABS: 0.7500 mg | ORAL_TABLET | Freq: Every evening | ORAL | 1 refills | Status: DC

## 2021-12-25 MED ORDER — PROPRANOLOL HCL 10 MG PO TABS: ORAL_TABLET | ORAL | 0 refills | Status: DC

## 2021-12-25 MED ORDER — METHYLPHENIDATE HCL 20 MG PO TABS: 20.0000 mg | ORAL_TABLET | Freq: Two times a day (BID) | ORAL | 0 refills | Status: DC

## 2021-12-25 MED ORDER — CLONAZEPAM 0.5 MG PO TABS: 0.5000 mg | ORAL_TABLET | Freq: Two times a day (BID) | ORAL | 2 refills | Status: DC

## 2021-12-25 MED ORDER — ESCITALOPRAM OXALATE 20 MG PO TABS: 20.0000 mg | ORAL_TABLET | Freq: Every day | ORAL | 0 refills | Status: DC
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
## 2021-12-26 ENCOUNTER — Ambulatory Visit: Payer: BC Managed Care – PPO | Admitting: Psychiatry

## 2021-12-26 DIAGNOSIS — F39 Unspecified mood [affective] disorder: Secondary | ICD-10-CM
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
## 2021-12-31 IMAGING — DX DG CHEST 2V
2 series · 2 of 2 positions shown · non-contrast
Comparison: September 23, 2006

CLINICAL DATA: Shortness of breath patient reports having an
anxiety attack

EXAM:
CHEST - 2 VIEW

[chest pa]
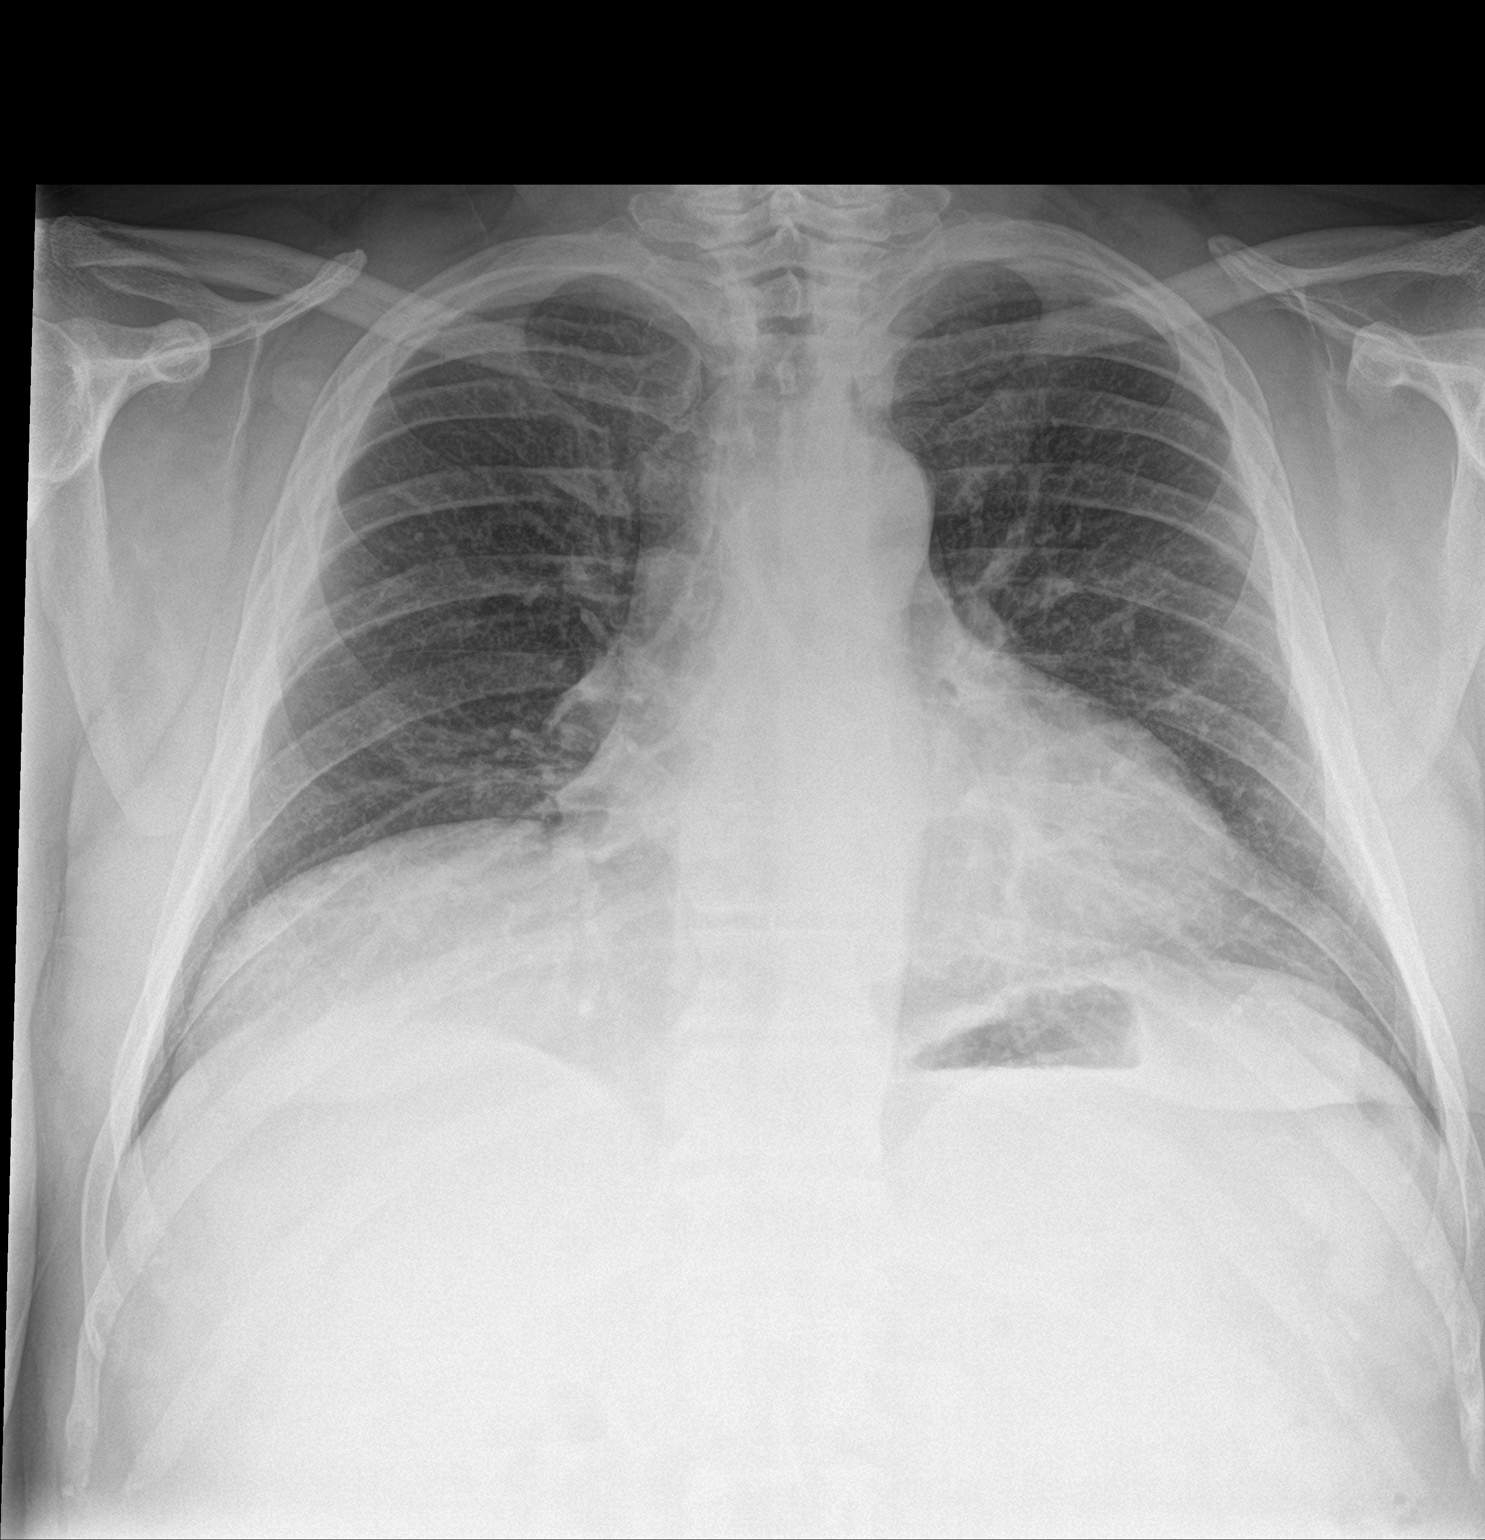

[chest lat]
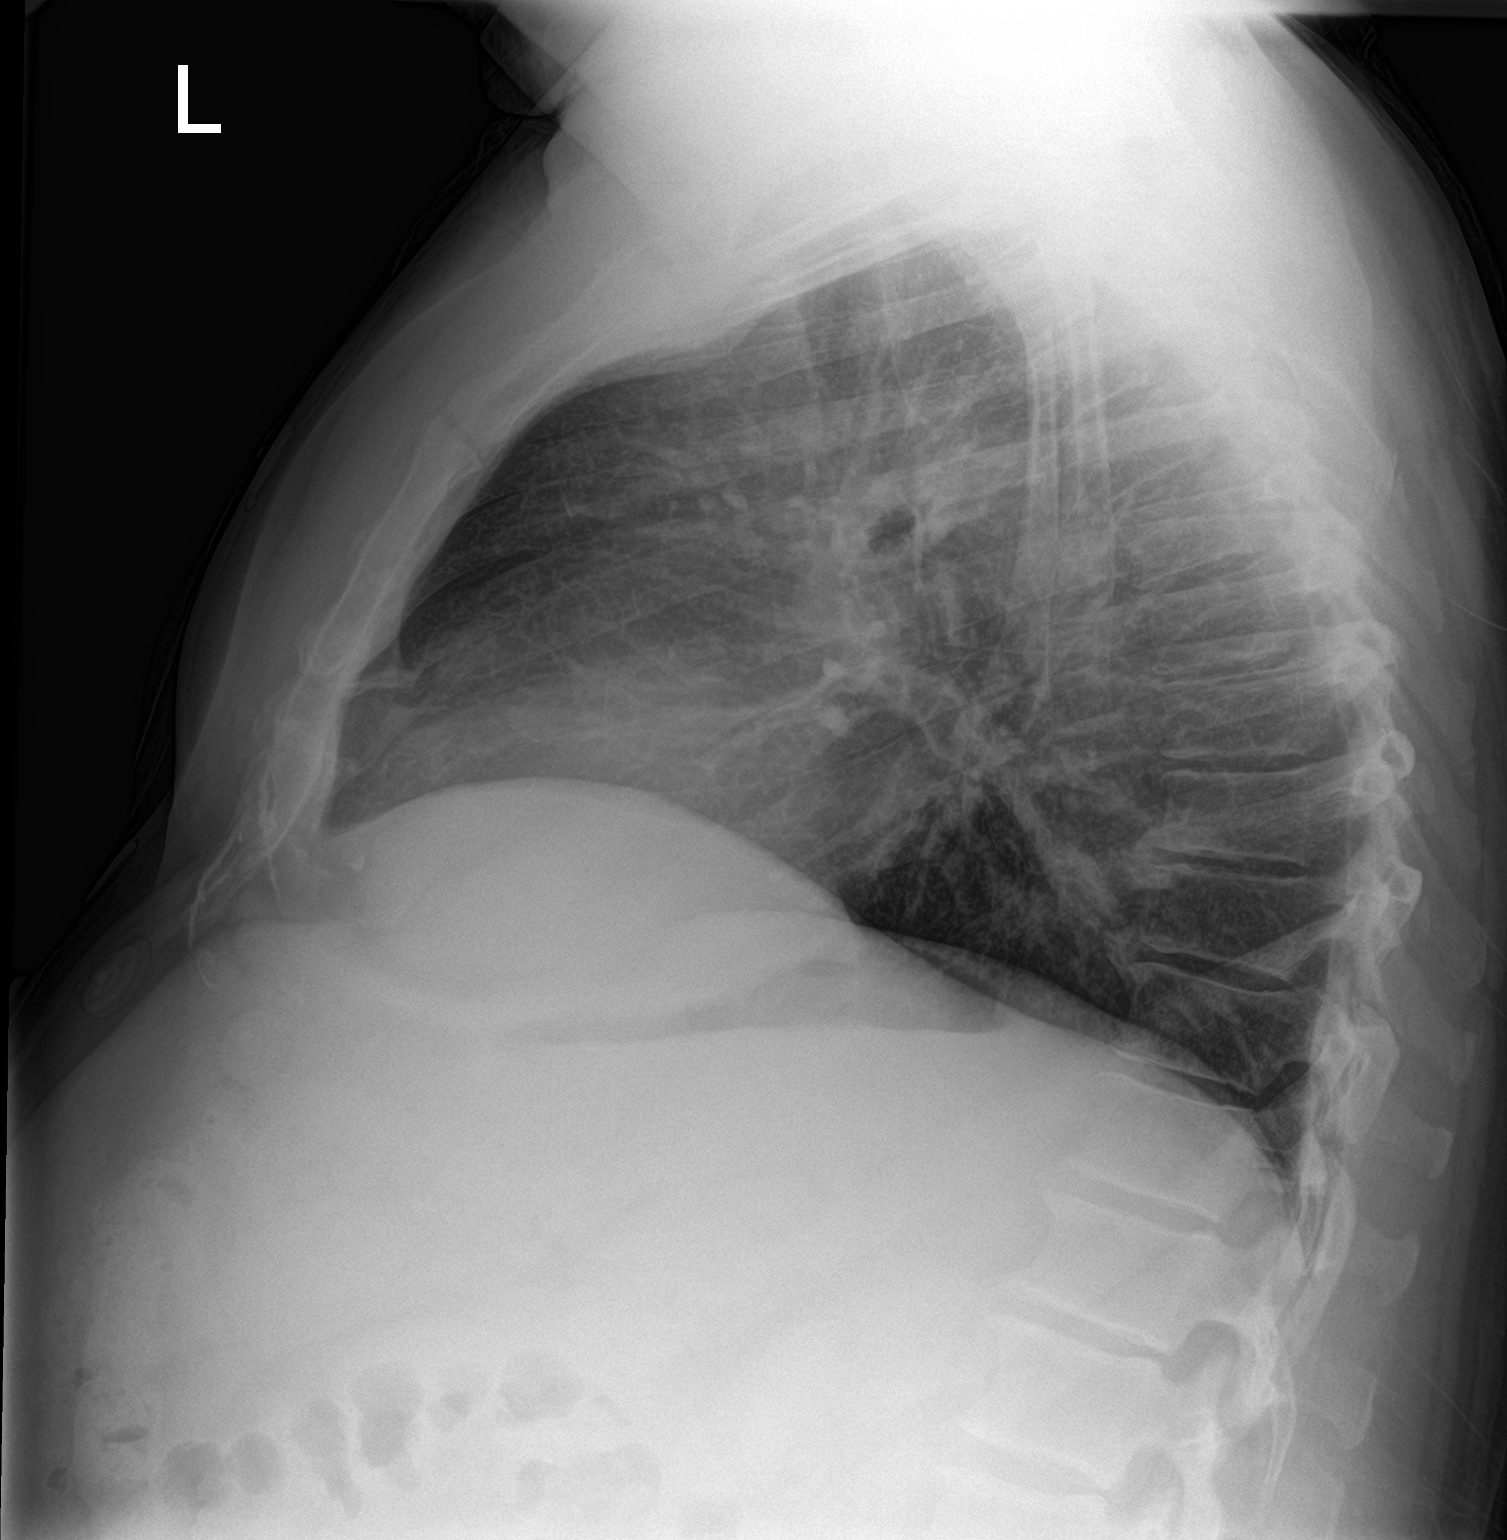

[2 of 2 positions shown; findings below may reference images not displayed]

FINDINGS: Borderline cardiac enlargement. Low lung volumes with bibasilar
atelectasis. No pleural effusion or pneumothorax. Thoracic
spondylosis.
IMPRESSION: Borderline cardiac enlargement. Low lung volumes with bibasilar
atelectasis.

## 2022-01-12 IMAGING — CT CT CHEST W/ CM
1 series · 15 of 33 positions shown, 19 images · IV contrast (APPLIED)
Comparison: Chest radiograph 03/31/2021

CLINICAL DATA: Wheezing and chest tightness.

EXAM:
CT CHEST WITH CONTRAST
TECHNIQUE: Multidetector CT imaging of the chest was performed during
intravenous contrast administration.
CONTRAST:  75mL [id number] IOPAMIDOL ([id number]) INJECTION 61%

[Series 2: chest w/cm · axial · 0.96mm/px · z∈[-350,-64]mm · 15 of 169 slices shown, 19 images]
[im 13/169  mediastinal]
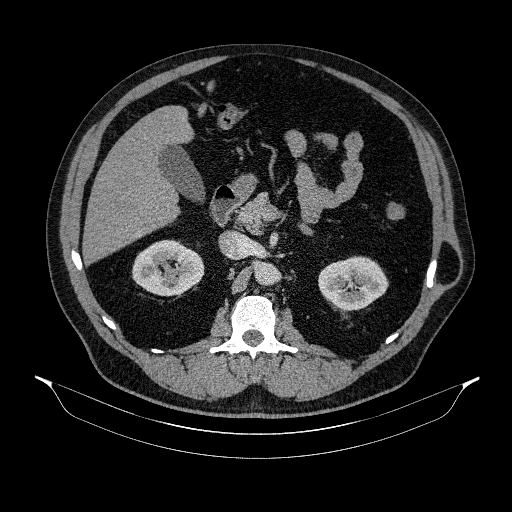
[im 13/169  lung]
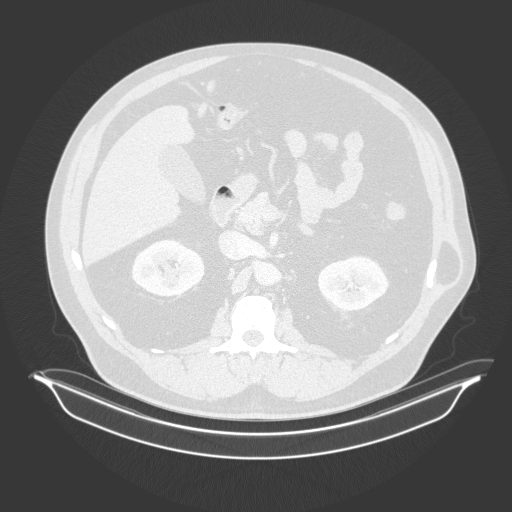
[im 25/169  lung]
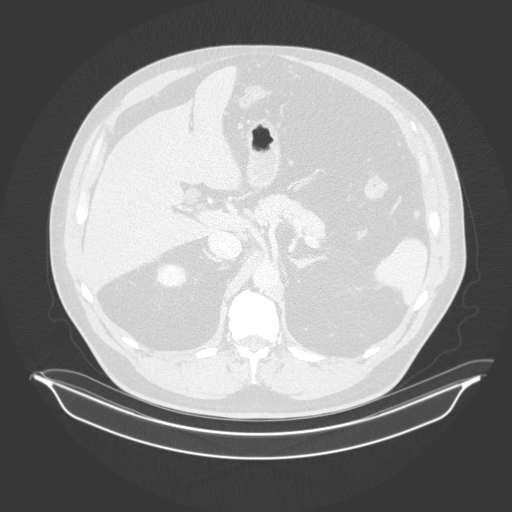
[im 34/169  lung]
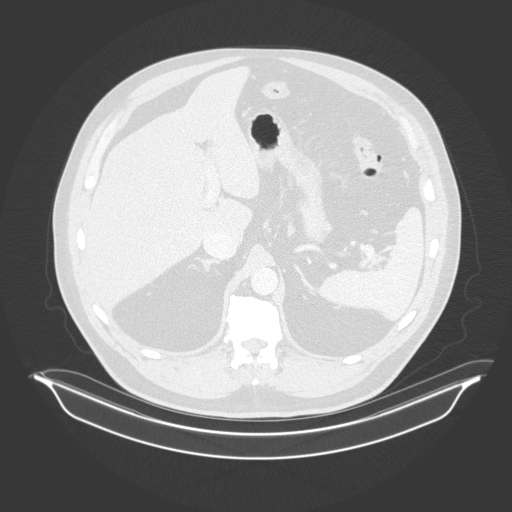
[im 44/169  lung]
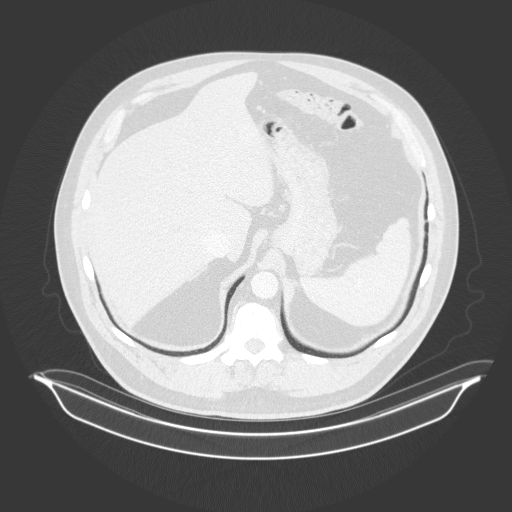
[im 57/169  mediastinal]
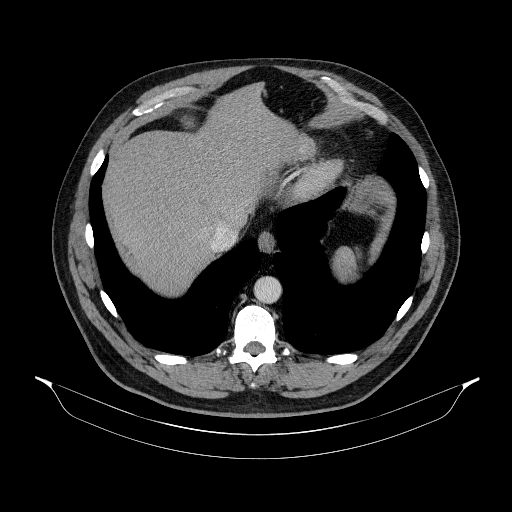
[im 57/169  lung]
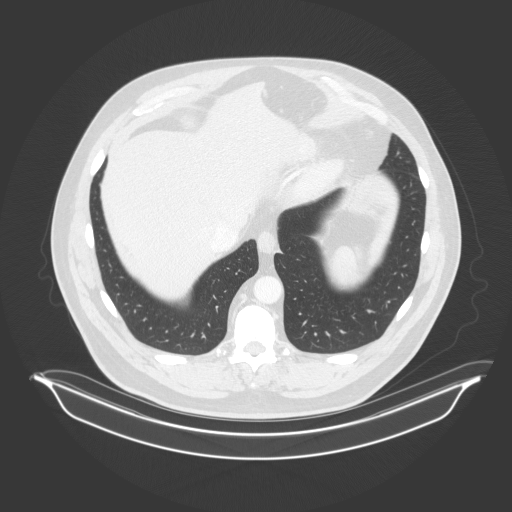
[im 68/169  lung]
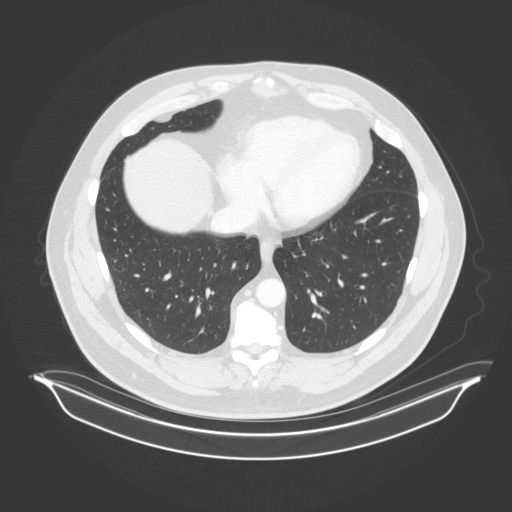
[im 75/169  lung]
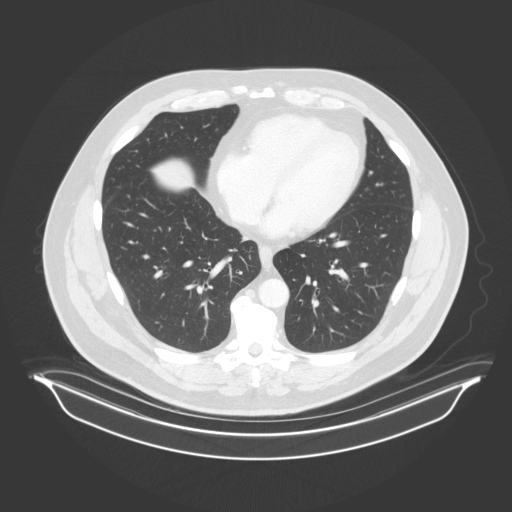
[im 88/169  lung]
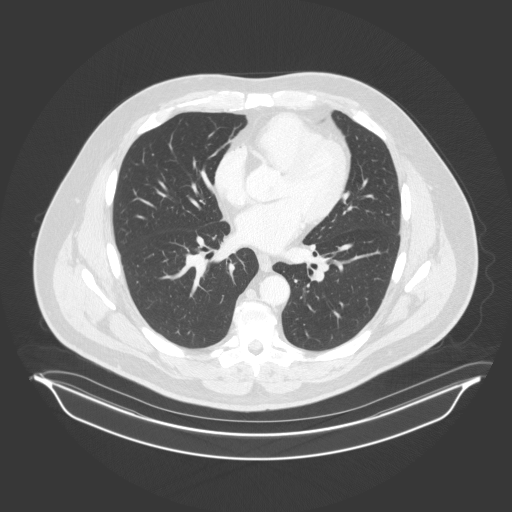
[im 94/169  mediastinal]
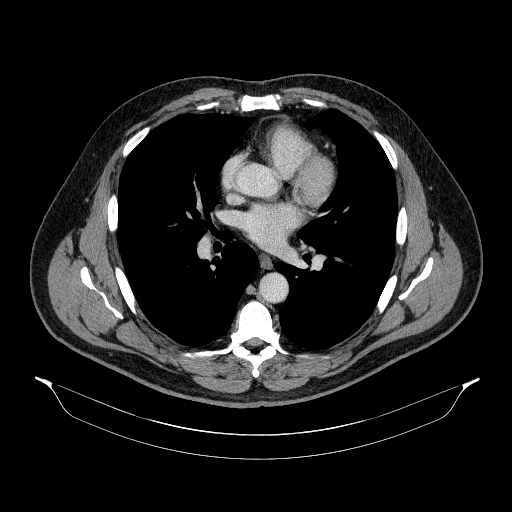
[im 94/169  lung]
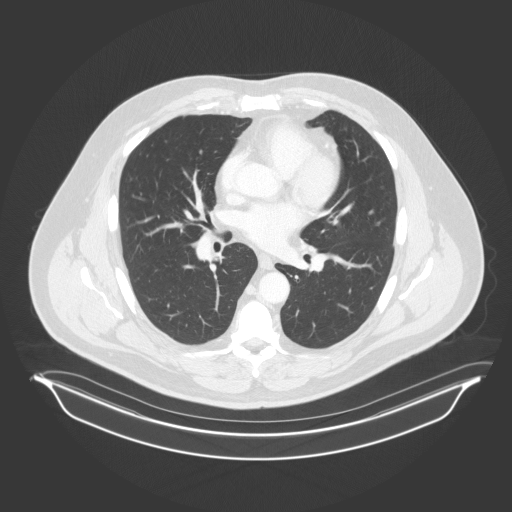
[im 101/169  lung]
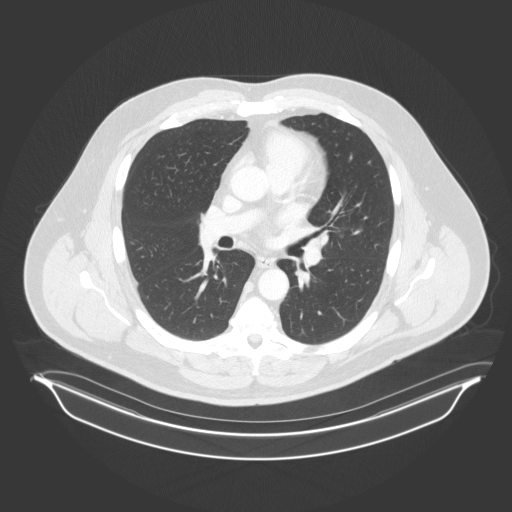
[im 113/169  lung]
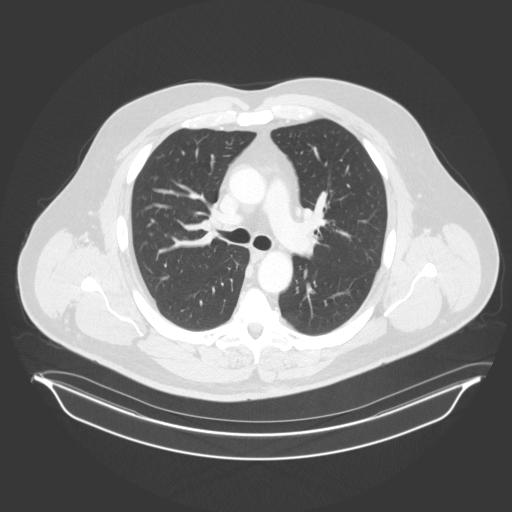
[im 125/169  lung]
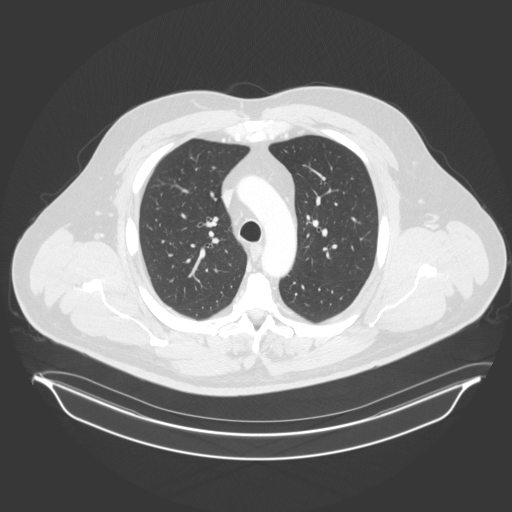
[im 135/169  mediastinal]
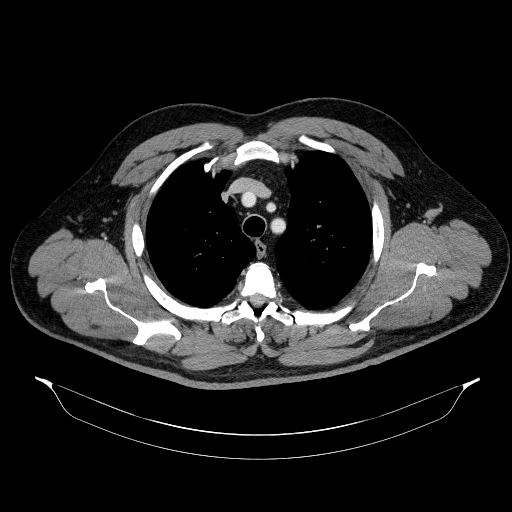
[im 135/169  lung]
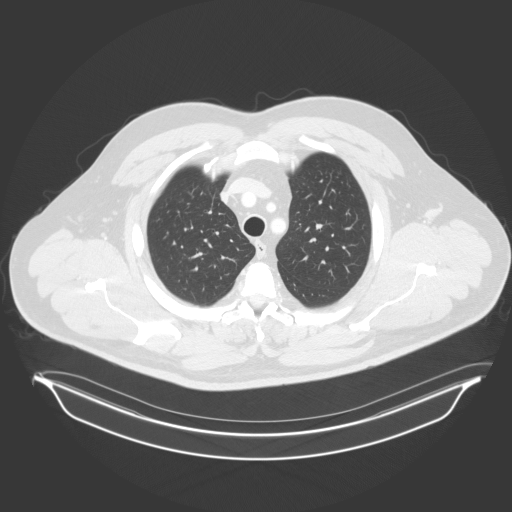
[im 144/169  lung]
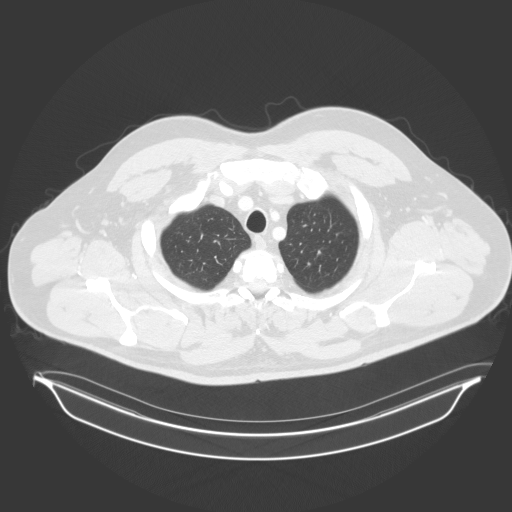
[im 156/169  lung]
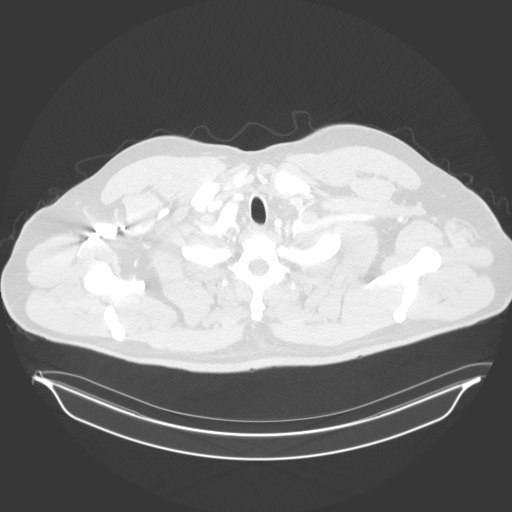

[15 of 33 positions shown; findings below may reference images not displayed]

FINDINGS: Cardiovascular: Thoracic aorta is normal in caliber. Trace aortic
atherosclerosis. Upper normal heart size. No pericardial effusion.
Normal caliber of the main pulmonary arteries.

Mediastinum/Nodes: No enlarged mediastinal or hilar lymph nodes. No
axillary adenopathy. No mediastinal mass. Unremarkable thyroid
gland. Decompressed esophagus.

Lungs/Pleura: Clear lungs. No acute or focal airspace disease.
Trachea and central bronchi are patent. No tracheal stenosis. No
significant bronchial thickening. No pleural fluid. No pulmonary
nodule or mass. No findings of pulmonary edema.

Upper Abdomen: No acute findings. Mild hepatic steatosis. Small
gallstones without pericholecystic inflammation. Nonobstructing
stone in the upper left kidney. Homogeneous fat density in the left
lateral upper abdominal wall spanning approximately 5.1 cm, series
2, image 163. It is unclear if this represents an intramuscular
lipoma within the lateral rectus muscle or a lateral abdominal wall
hernia, as there may be continuity with the adjacent perirenal fat.

Musculoskeletal: Mild thoracic spondylosis with spurring. There are
no acute or suspicious osseous abnormalities.
IMPRESSION: 1. No acute intrathoracic abnormality or explanation for wheezing.
2. Homogeneous fat density in the left lateral upper abdominal wall
measuring 5.1 cm. It is unclear if this represents an intramuscular
lipoma within the lateral rectus muscle or a lateral abdominal wall
hernia, as there may be continuity with the adjacent perirenal fat.
3. Incidental findings in the upper abdomen of cholelithiasis and
hepatic steatosis. Nonobstructing left renal stone.
4. Trace thoracic aortic atherosclerosis.

Aortic Atherosclerosis ([id number]).

## 2022-01-17 ENCOUNTER — Ambulatory Visit: Payer: BC Managed Care – PPO | Admitting: Psychiatry

## 2022-01-18 ENCOUNTER — Ambulatory Visit (INDEPENDENT_AMBULATORY_CARE_PROVIDER_SITE_OTHER): Payer: BC Managed Care – PPO | Admitting: Psychiatry

## 2022-01-18 ENCOUNTER — Other Ambulatory Visit: Payer: Self-pay

## 2022-01-18 DIAGNOSIS — F39 Unspecified mood [affective] disorder: Secondary | ICD-10-CM
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
## 2022-01-19 ENCOUNTER — Ambulatory Visit (INDEPENDENT_AMBULATORY_CARE_PROVIDER_SITE_OTHER): Payer: BC Managed Care – PPO | Admitting: Psychiatry

## 2022-01-19 ENCOUNTER — Telehealth: Payer: Self-pay | Admitting: Psychiatry

## 2022-01-19 DIAGNOSIS — F410 Panic disorder [episodic paroxysmal anxiety] without agoraphobia: Secondary | ICD-10-CM | POA: Diagnosis not present

## 2022-01-19 DIAGNOSIS — F4310 Post-traumatic stress disorder, unspecified: Secondary | ICD-10-CM | POA: Diagnosis not present
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
## 2022-01-19 NOTE — Telephone Encounter (Signed)
He said no. He needs to show up on time for work Monday or he doesn't have a job.

## 2022-01-19 NOTE — Telephone Encounter (Signed)
Timothy Camacho called and LM . After appt today he went into the meeting at work. He was told he needed to "shape up" and if he didn't he would lose his job. The letter had no effect on them. He wanted you to know.

## 2022-01-19 NOTE — Telephone Encounter (Signed)
Ok, thanks for passing along the message. It sounds like they did not discuss any accommodations?

## 2022-01-25 ENCOUNTER — Ambulatory Visit (INDEPENDENT_AMBULATORY_CARE_PROVIDER_SITE_OTHER): Payer: BC Managed Care – PPO | Admitting: Psychiatry

## 2022-01-25 ENCOUNTER — Other Ambulatory Visit: Payer: Self-pay

## 2022-01-25 DIAGNOSIS — F39 Unspecified mood [affective] disorder: Secondary | ICD-10-CM | POA: Diagnosis not present

## 2022-01-25 NOTE — Progress Notes (Signed)
°      [facility] Counselor/Therapist Progress Note  Patient ID: Timothy Camacho, MRN: [id number],    Date: 01/25/2022  Time Spent: 58 minutes start time 10:02 AM end time 11 AM  Treatment Type: Individual Therapy  Reported Symptoms: sadness, anxiety, triggered responses,   Mental Status Exam:  Appearance:   Well Groomed     Behavior:  Appropriate  Motor:  Normal  Speech/Language:   Normal Rate  Affect:  Appropriate  Mood:  sad  Thought process:  circumstantial  Thought content:    WNL  Sensory/Perceptual disturbances:    WNL  Orientation:  oriented to person, place, time/date, and situation  Attention:  Good  Concentration:  Good  Memory:  WNL  Fund of knowledge:   Good  Insight:    Good  Judgment:   Good  Impulse Control:  Good   Risk Assessment: Danger to Self:  No Self-injurious Behavior: No Danger to Others: No Duty to Warn:no Physical Aggression / Violence:No  Access to Firearms a concern: No  Gang Involvement:No   Subjective: Patient was present for session.  He shared that he is currently on administrative leave.  He shared that things are still difficult with his work situation.  He is struggling with things that he was accused of at work.  Patient stated he wanted to work on the initial trigger from work in today's session-boss saying is not going to change, suds level 10, negative cognition "I am trapped" felt anger in his chest.  Patient was finally able to reduce suds level to 6.  He was able to recognize that the way he was treated in his situation with his boss felt very similar to his dad from childhood.  Patient was able to also realize that the regional managers had seen value in him and that is why he was moving him to a different location.  He was able to recognize things that he can do differently at the new location and think through ways to talk himself through it.  Interventions: Cognitive Behavioral Therapy, Solution-Oriented/Positive Psychology, Eye  Movement Desensitization and Reprocessing (EMDR), and Insight-Oriented  Diagnosis:   ICD-10-CM   1. Mood disorder ([place])  F39       Plan: Patient is to use CBT and coping skills to decrease mood issues.  Patient is to start exercising daily to release negative emotions appropriately.  Patient is to go to a new location with his work.  Patient is to take medication as directed.  Patient is to continue working with provider Thayer Headings Badger Lee, [facility]

## 2022-02-22 ENCOUNTER — Other Ambulatory Visit: Payer: Self-pay

## 2022-02-22 ENCOUNTER — Encounter: Payer: Self-pay | Admitting: Psychiatry

## 2022-02-22 ENCOUNTER — Ambulatory Visit (INDEPENDENT_AMBULATORY_CARE_PROVIDER_SITE_OTHER): Payer: BC Managed Care – PPO | Admitting: Psychiatry

## 2022-02-22 VITALS — BP 122/74 | HR 69

## 2022-02-22 DIAGNOSIS — F419 Anxiety disorder, unspecified: Secondary | ICD-10-CM | POA: Diagnosis not present

## 2022-02-22 DIAGNOSIS — F900 Attention-deficit hyperactivity disorder, predominantly inattentive type: Secondary | ICD-10-CM

## 2022-02-22 DIAGNOSIS — F39 Unspecified mood [affective] disorder: Secondary | ICD-10-CM

## 2022-02-22 MED ORDER — PROPRANOLOL HCL 10 MG PO TABS: ORAL_TABLET | ORAL | 0 refills | Status: DC

## 2022-02-22 MED ORDER — METHYLPHENIDATE HCL 20 MG PO TABS: ORAL_TABLET | ORAL | 0 refills | Status: DC

## 2022-02-22 MED ORDER — METHYLPHENIDATE HCL ER (CD) 20 MG PO CPCR: 20.0000 mg | ORAL_CAPSULE | Freq: Every day | ORAL | 0 refills | Status: DC

## 2022-02-22 MED ORDER — ESCITALOPRAM OXALATE 20 MG PO TABS: 20.0000 mg | ORAL_TABLET | Freq: Every day | ORAL | 0 refills | Status: DC
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
## 2022-03-14 ENCOUNTER — Ambulatory Visit (INDEPENDENT_AMBULATORY_CARE_PROVIDER_SITE_OTHER): Payer: BC Managed Care – PPO | Admitting: Psychiatry

## 2022-03-14 DIAGNOSIS — F39 Unspecified mood [affective] disorder: Secondary | ICD-10-CM | POA: Diagnosis not present

## 2022-03-14 NOTE — Progress Notes (Signed)
?      [facility] Counselor/Therapist Progress Note ? ?Patient ID: Timothy Camacho, MRN: [id number],   ? ?Date: 03/14/2022 ? ?Time Spent: 49 minutes start time 12:11 PM end time 1 PM ? ?Treatment Type: Individual Therapy ? ?Reported Symptoms: anxiety, irritability,triggered responses, flashbacks ? ?Mental Status Exam: ? ?Appearance:   Well Groomed     ?Behavior:  Appropriate  ?Motor:  Normal  ?Speech/Language:   Normal Rate  ?Affect:  Appropriate  ?Mood:  anxious  ?Thought process:  normal  ?Thought content:    WNL  ?Sensory/Perceptual disturbances:    WNL  ?Orientation:  oriented to person, place, time/date, and situation  ?Attention:  Good  ?Concentration:  Good  ?Memory:  WNL  ?Fund of knowledge:   Good  ?Insight:    Good  ?Judgment:   Good  ?Impulse Control:  Good  ? ?Risk Assessment: ?Danger to Self:  No ?Self-injurious Behavior: No ?Danger to Others: No ?Duty to Warn:no ?Physical Aggression / Violence:No  ?Access to Firearms a concern: No  ?Gang Involvement:No  ? ?Subjective: Patient was present for session. 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  He went on to share that he has been able to have some good conversations with his manager and that is helping him try and work on perspective.  He is realizing that the system is not set up for success and that is very troubling for him.  Discussed different CBT filters he can use to try and help him manage how he handles things.  Patient was reminded he has to focus on the things that he can control fix and change and to work on letting go of things that he can do nothing about.  He was encouraged to think of a visual of leaving the stuff behind whenever he walks out of the door so that when he is away from work he can relax and enjoy himself.  Patient was also encouraged to continue looking into other places to get his enjoyment or to start looking for another possible work option for him.  Patient was encouraged to continue using CBT and  coping skills to help calm himself and be able to maintain perspective at work. ? ?Interventions: Cognitive Behavioral Therapy and Solution-Oriented/Positive Psychology ? ?Diagnosis: ?  ICD-10-CM   ?1. Mild mood disorder (HCC)  F39   ?  ? ? ?Plan: Patient is to use CBT and coping skills to decrease mood issues.  Patient is to start exercising daily to release negative emotions appropriately.  Patient is to use visualizations to help him with maintaining emotions at his work.  Patient is to take medication as directed.  Patient is to continue working with provider Corie Chiquito PMHNP ?  ? ?Stevphen Meuse, [facility] ? ? ? ? ? ? ? ? ? ? ? ? ? ? ? ? ? ? ?

## 2022-03-22 ENCOUNTER — Other Ambulatory Visit: Payer: Self-pay | Admitting: Psychiatry

## 2022-03-22 ENCOUNTER — Encounter: Payer: Self-pay | Admitting: Psychiatry

## 2022-03-22 ENCOUNTER — Ambulatory Visit (INDEPENDENT_AMBULATORY_CARE_PROVIDER_SITE_OTHER): Payer: BC Managed Care – PPO | Admitting: Psychiatry

## 2022-03-22 DIAGNOSIS — G2581 Restless legs syndrome: Secondary | ICD-10-CM

## 2022-03-22 DIAGNOSIS — F410 Panic disorder [episodic paroxysmal anxiety] without agoraphobia: Secondary | ICD-10-CM

## 2022-03-22 DIAGNOSIS — F900 Attention-deficit hyperactivity disorder, predominantly inattentive type: Secondary | ICD-10-CM

## 2022-03-22 DIAGNOSIS — F419 Anxiety disorder, unspecified: Secondary | ICD-10-CM

## 2022-03-22 DIAGNOSIS — F39 Unspecified mood [affective] disorder: Secondary | ICD-10-CM | POA: Diagnosis not present

## 2022-03-22 DIAGNOSIS — G4700 Insomnia, unspecified: Secondary | ICD-10-CM

## 2022-03-22 MED ORDER — CLONAZEPAM 0.5 MG PO TABS: 0.5000 mg | ORAL_TABLET | Freq: Two times a day (BID) | ORAL | 2 refills | Status: DC

## 2022-03-22 MED ORDER — METHYLPHENIDATE HCL 20 MG PO TABS: 20.0000 mg | ORAL_TABLET | Freq: Every day | ORAL | 0 refills | Status: DC | PRN

## 2022-03-22 MED ORDER — METHYLPHENIDATE HCL ER (CD) 30 MG PO CPCR: 30.0000 mg | ORAL_CAPSULE | Freq: Every day | ORAL | 0 refills | Status: DC

## 2022-03-22 MED ORDER — ESCITALOPRAM OXALATE 20 MG PO TABS: 20.0000 mg | ORAL_TABLET | Freq: Every day | ORAL | 0 refills | Status: DC

## 2022-03-22 MED ORDER — PRAMIPEXOLE DIHYDROCHLORIDE 0.5 MG PO TABS: 0.7500 mg | ORAL_TABLET | Freq: Every evening | ORAL | 1 refills | Status: DC

## 2022-03-22 MED ORDER — METHYLPHENIDATE HCL ER (CD) 30 MG PO CPCR: 30.0000 mg | ORAL_CAPSULE | ORAL | 0 refills | Status: DC

## 2022-03-22 MED ORDER — HYDROCHLOROTHIAZIDE 25 MG PO TABS: 25.0000 mg | ORAL_TABLET | Freq: Every day | ORAL | 11 refills | Status: AC

## 2022-03-22 NOTE — Progress Notes (Signed)
Timothy Camacho ?[id number] ?[birth year] ?61 y.o. ? ?Subjective:  ? ?Patient ID:  Timothy Camacho is a 61 y.o. (DOB June 29, 1961) male. ? ?Chief Complaint:  ?Chief Complaint  ?Patient presents with  ? Follow-up  ?  Anxiety, mood disturbance, ADHD.   ? ? ?HPI ?Timothy Camacho presents to the office today for follow-up of ADHD, anxiety, and mood disturbance. He has been taking Ritalin 20 mg in the morning around 5:30 am. He has started Metadate around lunch (1 pm). He reports that duration seems to last about 6 hours. He goes into work at 7 am. 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 "I do feel better with the meds." He reports that he feels more alert and productive later in the day and is no longer feeling anxious or "agitated" at the end of the day. He reports that he is more productive and able to concentrate. He reports that he has not needed Klonopin as much. He reports anxiety has been less and mood has improved. He reports that his appetite has been ok and that he notices he cannot lose weight. Improved energy and motivation. He reports that he is sleeping well. He reports that others have noticed he is talking and "combative" in his sleep. His wife reports that this has been long-standing. Denies SI.  ? ?Manager has resigned.  ? ?Flowsheet Row ED from 04/29/2021 in [facility] EMERGENCY DEPARTMENT  ?C-SSRS RISK CATEGORY No Risk  ? ?  ?  ? ?Review of Systems:  ?Review of Systems  ?Cardiovascular:  Negative for palpitations.  ?Musculoskeletal:  Negative for gait problem.  ?Psychiatric/Behavioral:    ?     Please refer to HPI  ? ?Medications: I have reviewed the patient's current medications. ? ?Current Outpatient Medications  ?Medication Sig Dispense Refill  ? [START ON 04/19/2022] methylphenidate (METADATE CD) 30 MG CR capsule Take 1 capsule (30 mg total) by mouth every morning. 30 capsule 0  ? acetaminophen (TYLENOL) 500 MG tablet Take 500 mg by mouth every 6 (six)  hours as needed (pain).    ? allopurinol (ZYLOPRIM) 300 MG tablet Take 300 mg by mouth daily.    ? atorvastatin (LIPITOR) 10 MG tablet Take 10 mg by mouth daily.    ? Cholecalciferol (VITAMIN D) 2000 units CAPS Take by mouth.    ? [START ON 04/29/2022] clonazePAM (KLONOPIN) 0.5 MG tablet Take 1 tablet (0.5 mg total) by mouth 2 (two) times daily. 60 tablet 2  ? escitalopram (LEXAPRO) 20 MG tablet Take 1 tablet (20 mg total) by mouth daily. 90 tablet 0  ? hydrochlorothiazide (HYDRODIURIL) 25 MG tablet Take 1 tablet (25 mg total) by mouth daily. 30 tablet 11  ? ibuprofen (ADVIL,MOTRIN) 200 MG tablet Take 800 mg by mouth every 6 (six) hours as needed for headache or moderate pain.    ? methylphenidate (METADATE CD) 30 MG CR capsule Take 1 capsule (30 mg total) by mouth daily. 30 capsule 0  ? [START ON 04/19/2022] methylphenidate (RITALIN) 20 MG tablet Take 1 tablet (20 mg total) by mouth daily as needed. 30 tablet 0  ? methylphenidate (RITALIN) 20 MG tablet Take 1 tablet (20 mg total) by mouth daily as needed. 30 tablet 0  ? Multiple Vitamin (MULTIVITAMIN) tablet Take 1 tablet by mouth daily.    ? potassium chloride (KLOR-CON) 10 MEQ tablet Take 1 tablet (10 mEq total) by mouth daily. 30 tablet 11  ? pramipexole (  MIRAPEX) 0.5 MG tablet Take 1.5 tablets (0.75 mg total) by mouth every evening. 90 tablet 1  ? propranolol (INDERAL) 10 MG tablet TAKE 1 TO 2 TABLETS BY MOUTH TWICE A DAY AS NEEDED FOR ANXIETY 120 tablet 0  ? ?No current facility-administered medications for this visit.  ? ? ?Medication Side Effects: None ? ?Allergies:  ?Allergies  ?Allergen Reactions  ? Risperidone And Related   ?  intolerance  ? Trileptal [Oxcarbazepine]   ?  intolerance  ? ? ?Past Medical History:  ?Diagnosis Date  ? Gout   ? History of peritonsillar abscess   ? RLS (restless legs syndrome)   ? ? ?Past Medical History, Surgical history, Social history, and Family history were reviewed and updated as appropriate.  ? ?Please see review of systems  for further details on the patient's review from today.  ? ?Objective:  ? ?Physical Exam:  ?BP 121/72   Pulse 70  ? ?Physical Exam ?Constitutional:   ?   General: He is not in acute distress. ?Musculoskeletal:     ?   General: No deformity.  ?Neurological:  ?   Mental Status: He is alert and oriented to person, place, and time.  ?   Coordination: Coordination normal.  ?Psychiatric:     ?   Attention and Perception: Attention and perception normal. He does not perceive auditory or visual hallucinations.     ?   Mood and Affect: Mood normal. Mood is not anxious or depressed. Affect is not labile, blunt, angry or inappropriate.     ?   Speech: Speech normal.     ?   Behavior: Behavior normal.     ?   Thought Content: Thought content normal. Thought content is not paranoid or delusional. Thought content does not include homicidal or suicidal ideation. Thought content does not include homicidal or suicidal plan.     ?   Cognition and Memory: Cognition and memory normal.     ?   Judgment: Judgment normal.  ?   Comments: Insight intact  ? ? ?Lab Review:  ?   ?Component Value Date/Time  ? NA 139 04/29/2021 0026  ? NA 143 06/28/2020 1556  ? K 4.5 04/29/2021 0026  ? CL 105 04/29/2021 0026  ? CO2 29 04/29/2021 0026  ? GLUCOSE 126 (H) 04/29/2021 0026  ? BUN 15 04/29/2021 0026  ? BUN 15 06/28/2020 1556  ? CREATININE 1.11 04/29/2021 0026  ? CALCIUM 9.0 04/29/2021 0026  ? PROT 7.1 04/29/2021 0026  ? PROT 7.1 06/07/2020 1659  ? ALBUMIN 3.8 04/29/2021 0026  ? ALBUMIN 4.5 06/07/2020 1659  ? AST 33 04/29/2021 0026  ? ALT 33 04/29/2021 0026  ? ALKPHOS 45 04/29/2021 0026  ? BILITOT 0.6 04/29/2021 0026  ? BILITOT 0.3 06/07/2020 1659  ? GFRNONAA >60 04/29/2021 0026  ? GFRAA 93 06/28/2020 1556  ? ? ?   ?Component Value Date/Time  ? WBC 9.7 04/29/2021 0026  ? RBC 5.17 04/29/2021 0026  ? HGB 15.8 04/29/2021 0026  ? HGB 14.7 06/07/2020 1659  ? HCT 47.5 04/29/2021 0026  ? HCT 42.8 06/07/2020 1659  ? PLT 262 04/29/2021 0026  ? PLT 265  06/07/2020 1659  ? MCV 91.9 04/29/2021 0026  ? MCV 92 06/07/2020 1659  ? MCH 30.6 04/29/2021 0026  ? MCHC 33.3 04/29/2021 0026  ? RDW 14.2 04/29/2021 0026  ? RDW 13.4 06/07/2020 1659  ? LYMPHSABS 2.4 04/29/2021 0026  ? MONOABS 1.3 (H) 04/29/2021 0026  ? EOSABS  0.4 04/29/2021 0026  ? BASOSABS 0.1 04/29/2021 0026  ? ? ?No results found for: POCLITH, LITHIUM  ? ?No results found for: PHENYTOIN, PHENOBARB, VALPROATE, CBMZ  ? ?.res ?Assessment: Plan:   ?Will increase Metadate to 30 mg daily in the afternoons since pt reports that medication is slightly less effective than when he started it. He reports that overall Metadate has been helpful for his concentration, mood, energy, and anxiety.  ?Will continue Ritalin 20 mg in the morning for ADHD.  ?Continue Lexapro 20 mg po qd for mood and anxiety s/s.  ?Continue Klonopin 0.5 mg twice daily as needed for anxiety.  ?Continue Pramipexole 0.75 mg every evening for RLS.  ?Recommend continuing therapy with Stevphen MeuseHolly Ingram, [facility].  ?Pt to follow-up with this provider in 2 months or sooner if clinically indicated.  ?Patient advised to contact office with any questions, adverse effects, or acute worsening in signs and symptoms. ? ? ?Timothy HillDonald was seen today for follow-up. ? ?Diagnoses and all orders for this visit: ? ?Attention deficit hyperactivity disorder (ADHD), predominantly inattentive type ?-     methylphenidate (METADATE CD) 30 MG CR capsule; Take 1 capsule (30 mg total) by mouth daily. ?-     methylphenidate (RITALIN) 20 MG tablet; Take 1 tablet (20 mg total) by mouth daily as needed. ?-     methylphenidate (RITALIN) 20 MG tablet; Take 1 tablet (20 mg total) by mouth daily as needed. ? ?Anxiety disorder, unspecified type ?-     escitalopram (LEXAPRO) 20 MG tablet; Take 1 tablet (20 mg total) by mouth daily. ? ?Mild mood disorder (HCC) ?-     escitalopram (LEXAPRO) 20 MG tablet; Take 1 tablet (20 mg total) by mouth daily. ? ?RLS (restless legs syndrome) ?-     clonazePAM  (KLONOPIN) 0.5 MG tablet; Take 1 tablet (0.5 mg total) by mouth 2 (two) times daily. ? ?Panic disorder ?-     clonazePAM (KLONOPIN) 0.5 MG tablet; Take 1 tablet (0.5 mg total) by mouth 2 (two) times daily. ? ?[place]om[place]

## 2022-03-27 ENCOUNTER — Ambulatory Visit: Payer: BC Managed Care – PPO | Admitting: Psychiatry

## 2022-03-27 ENCOUNTER — Other Ambulatory Visit: Payer: Self-pay

## 2022-03-27 ENCOUNTER — Telehealth: Payer: Self-pay | Admitting: Psychiatry

## 2022-03-27 DIAGNOSIS — F900 Attention-deficit hyperactivity disorder, predominantly inattentive type: Secondary | ICD-10-CM

## 2022-03-27 MED ORDER — METHYLPHENIDATE HCL ER (CD) 30 MG PO CPCR: 30.0000 mg | ORAL_CAPSULE | Freq: Every day | ORAL | 0 refills | Status: DC

## 2022-03-27 NOTE — Telephone Encounter (Signed)
Pended.

## 2022-03-27 NOTE — Telephone Encounter (Signed)
Pt LVM at 7:51a.  He says at his visit last Jessy Oto increased his Methylphenidate from 20mg  to 30mg .  But upon looking at his chart, it shows the med she increased was Metadate.   ? ?Maybe he just said the wrong med in his voice mail (because he also said "time-release", which sounds like it's the Metadate).  But [facility] and the pharmacy had tried to contact last week to see what we wanted them to do  (if there was something else wanted him to try). ? ?Pls confirm that he knows and has checked for the correct medicine from the pharmacy.  ? ?Next appt 6/6 ?

## 2022-04-10 ENCOUNTER — Ambulatory Visit (INDEPENDENT_AMBULATORY_CARE_PROVIDER_SITE_OTHER): Payer: BC Managed Care – PPO | Admitting: Psychiatry

## 2022-04-10 DIAGNOSIS — F39 Unspecified mood [affective] disorder: Secondary | ICD-10-CM | POA: Diagnosis not present

## 2022-04-10 NOTE — Progress Notes (Signed)
?      [facility] Counselor/Therapist Progress Note ? ?Patient ID: Timothy Camacho, MRN: [id number],   ? ?Date: 04/10/2022 ? ?Time Spent: 50 minutes start time 1:04 PM end time 1:54 PM ? ?Treatment Type: Individual Therapy ? ?Reported Symptoms: sadness, anxiety, fatigue, low motivation, irritability ? ?Mental Status Exam: ? ?Appearance:   Well Groomed     ?Behavior:  Appropriate  ?Motor:  Normal  ?Speech/Language:   Normal Rate  ?Affect:  Appropriate  ?Mood:  labile  ?Thought process:  normal  ?Thought content:    WNL  ?Sensory/Perceptual disturbances:    WNL  ?Orientation:  oriented to person, place, time/date, and situation  ?Attention:  Good  ?Concentration:  Good  ?Memory:  WNL  ?Fund of knowledge:   Good  ?Insight:    Good  ?Judgment:   Good  ?Impulse Control:  Good  ? ?Risk Assessment: ?Danger to Self:  No ?Self-injurious Behavior: No ?Danger to Others: No ?Duty to Warn:no ?Physical Aggression / Violence:No  ?Access to Firearms a concern: No  ?Gang Involvement:No  ? ?Subjective: Patient was present for session.  He reported that he has lost another boss and he is having to wait and see what happens.  Patient explained that he is finding it difficult to function.  He reported he stayed in bed for 12 hours yesterday which is not like him at all.  Encouraged patient to think about what was creating the biggest issue.  Did processing set on changes at work, suds level 9, negative cognition "I am powerless" felt sadness in his head.  Patient was able to reduce suds level and recognize that he first felt powerless as a young boy with his stepfather.  Patient was able to recognize that he was never enough for him and that leads to his cycle today.  The importance of allowing the processing to continue and to work on regular affirmations was discussed with patient.  Patient was encouraged to contact our office if he needs to come in for of earlier appointment. ? ?Interventions: Cognitive Behavioral Therapy,  Solution-Oriented/Positive Psychology, Eye Movement Desensitization and Reprocessing (EMDR), and BS P ? ?Diagnosis: ?  ICD-10-CM   ?1. Mild mood disorder (HCC)  F39   ?  ? ? ?Plan: Patient is to use CBT and coping skills to decrease mood issues.  Patient is to start exercising daily to release negative emotions appropriately.  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  Patient is to use visualizations to help him with maintaining emotions at his work.  Patient is to take medication as directed.  Patient is to continue working with provider Timothy Camacho PMHNP ? ?Timothy Camacho, [facility] ? ? ? ? ? ? ? ? ? ? ? ? ? ? ? ? ? ? ?

## 2022-04-24 ENCOUNTER — Ambulatory Visit: Payer: BC Managed Care – PPO | Admitting: Psychiatry

## 2022-04-30 ENCOUNTER — Telehealth: Payer: Self-pay | Admitting: Psychiatry

## 2022-04-30 DIAGNOSIS — F900 Attention-deficit hyperactivity disorder, predominantly inattentive type: Secondary | ICD-10-CM

## 2022-04-30 MED ORDER — METHYLPHENIDATE HCL ER (CD) 30 MG PO CPCR: 30.0000 mg | ORAL_CAPSULE | Freq: Every day | ORAL | 0 refills | Status: DC

## 2022-04-30 NOTE — Telephone Encounter (Signed)
Please call pt and let him know what pharmacy said and that [facility] [address] [facility] [address]. ?

## 2022-04-30 NOTE — Telephone Encounter (Signed)
Notified Pt

## 2022-04-30 NOTE — Telephone Encounter (Signed)
Pharmacy  [facility] called asking to send Methyl[facility]idate CD 30 mg 1[facility]d [facility] [address] [employer]. They have in stock per pharmacist   ?

## 2022-05-09 ENCOUNTER — Ambulatory Visit (INDEPENDENT_AMBULATORY_CARE_PROVIDER_SITE_OTHER): Payer: BC Managed Care – PPO | Admitting: Psychiatry

## 2022-05-09 ENCOUNTER — Encounter: Payer: Self-pay | Admitting: Psychiatry

## 2022-05-09 DIAGNOSIS — F39 Unspecified mood [affective] disorder: Secondary | ICD-10-CM | POA: Diagnosis not present

## 2022-05-09 DIAGNOSIS — F4310 Post-traumatic stress disorder, unspecified: Secondary | ICD-10-CM | POA: Diagnosis not present

## 2022-05-09 DIAGNOSIS — F900 Attention-deficit hyperactivity disorder, predominantly inattentive type: Secondary | ICD-10-CM | POA: Diagnosis not present

## 2022-05-09 DIAGNOSIS — F419 Anxiety disorder, unspecified: Secondary | ICD-10-CM | POA: Diagnosis not present

## 2022-05-09 MED ORDER — METHYLPHENIDATE HCL 20 MG PO TABS: 20.0000 mg | ORAL_TABLET | Freq: Every day | ORAL | 0 refills | Status: DC | PRN

## 2022-05-09 MED ORDER — METHYLPHENIDATE HCL ER (CD) 30 MG PO CPCR: 30.0000 mg | ORAL_CAPSULE | ORAL | 0 refills | Status: DC

## 2022-05-09 MED ORDER — ESCITALOPRAM OXALATE 20 MG PO TABS: 20.0000 mg | ORAL_TABLET | Freq: Every day | ORAL | 0 refills | Status: DC

## 2022-05-09 MED ORDER — PROPRANOLOL HCL 10 MG PO TABS: ORAL_TABLET | ORAL | 0 refills | Status: DC
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
## 2022-05-18 DIAGNOSIS — B078 Other viral warts: Secondary | ICD-10-CM | POA: Diagnosis not present

## 2022-05-22 ENCOUNTER — Ambulatory Visit (INDEPENDENT_AMBULATORY_CARE_PROVIDER_SITE_OTHER): Payer: BC Managed Care – PPO | Admitting: Psychiatry

## 2022-05-22 ENCOUNTER — Encounter: Payer: Self-pay | Admitting: Psychiatry

## 2022-05-22 DIAGNOSIS — F4310 Post-traumatic stress disorder, unspecified: Secondary | ICD-10-CM

## 2022-05-22 DIAGNOSIS — F39 Unspecified mood [affective] disorder: Secondary | ICD-10-CM | POA: Diagnosis not present

## 2022-05-22 DIAGNOSIS — G4700 Insomnia, unspecified: Secondary | ICD-10-CM
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
## 2022-06-09 ENCOUNTER — Other Ambulatory Visit: Payer: Self-pay | Admitting: Psychiatry

## 2022-06-09 DIAGNOSIS — F419 Anxiety disorder, unspecified: Secondary | ICD-10-CM

## 2022-06-11 ENCOUNTER — Ambulatory Visit: Payer: BC Managed Care – PPO | Admitting: Psychiatry

## 2022-06-12 ENCOUNTER — Ambulatory Visit: Payer: BC Managed Care – PPO | Admitting: Psychiatry

## 2022-06-15 ENCOUNTER — Ambulatory Visit (INDEPENDENT_AMBULATORY_CARE_PROVIDER_SITE_OTHER): Payer: BC Managed Care – PPO | Admitting: Psychiatry

## 2022-06-15 DIAGNOSIS — F4310 Post-traumatic stress disorder, unspecified: Secondary | ICD-10-CM | POA: Diagnosis not present
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
## 2022-06-25 ENCOUNTER — Ambulatory Visit (INDEPENDENT_AMBULATORY_CARE_PROVIDER_SITE_OTHER): Payer: BC Managed Care – PPO | Admitting: Psychiatry

## 2022-06-25 DIAGNOSIS — F4310 Post-traumatic stress disorder, unspecified: Secondary | ICD-10-CM | POA: Diagnosis not present
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
## 2022-07-10 ENCOUNTER — Ambulatory Visit (INDEPENDENT_AMBULATORY_CARE_PROVIDER_SITE_OTHER): Payer: 59 | Admitting: Psychiatry

## 2022-07-10 ENCOUNTER — Encounter: Payer: Self-pay | Admitting: Psychiatry

## 2022-07-10 VITALS — BP 125/67 | HR 66

## 2022-07-10 DIAGNOSIS — F4310 Post-traumatic stress disorder, unspecified: Secondary | ICD-10-CM | POA: Diagnosis not present

## 2022-07-10 DIAGNOSIS — G4700 Insomnia, unspecified: Secondary | ICD-10-CM | POA: Diagnosis not present

## 2022-07-10 DIAGNOSIS — F410 Panic disorder [episodic paroxysmal anxiety] without agoraphobia: Secondary | ICD-10-CM

## 2022-07-10 DIAGNOSIS — G2581 Restless legs syndrome: Secondary | ICD-10-CM

## 2022-07-10 DIAGNOSIS — F900 Attention-deficit hyperactivity disorder, predominantly inattentive type: Secondary | ICD-10-CM | POA: Diagnosis not present

## 2022-07-10 DIAGNOSIS — R69 Illness, unspecified: Secondary | ICD-10-CM | POA: Diagnosis not present

## 2022-07-10 DIAGNOSIS — F39 Unspecified mood [affective] disorder: Secondary | ICD-10-CM | POA: Diagnosis not present

## 2022-07-10 MED ORDER — METHYLPHENIDATE HCL 20 MG PO TABS: 20.0000 mg | ORAL_TABLET | Freq: Every day | ORAL | 0 refills | Status: DC | PRN

## 2022-07-10 MED ORDER — ESCITALOPRAM OXALATE 20 MG PO TABS: 20.0000 mg | ORAL_TABLET | Freq: Every day | ORAL | 0 refills | Status: DC

## 2022-07-10 MED ORDER — PROPRANOLOL HCL 10 MG PO TABS: ORAL_TABLET | ORAL | 1 refills | Status: DC

## 2022-07-10 MED ORDER — PRAMIPEXOLE DIHYDROCHLORIDE 0.5 MG PO TABS: 0.7500 mg | ORAL_TABLET | Freq: Every evening | ORAL | 1 refills | Status: DC

## 2022-07-10 MED ORDER — METHYLPHENIDATE HCL ER (CD) 30 MG PO CPCR: 30.0000 mg | ORAL_CAPSULE | ORAL | 0 refills | Status: DC

## 2022-07-10 MED ORDER — CLONAZEPAM 0.5 MG PO TABS: 0.5000 mg | ORAL_TABLET | Freq: Two times a day (BID) | ORAL | 2 refills | Status: DC
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
## 2022-07-12 ENCOUNTER — Ambulatory Visit (INDEPENDENT_AMBULATORY_CARE_PROVIDER_SITE_OTHER): Payer: 59 | Admitting: Psychiatry

## 2022-07-12 DIAGNOSIS — F4310 Post-traumatic stress disorder, unspecified: Secondary | ICD-10-CM | POA: Diagnosis not present

## 2022-07-12 DIAGNOSIS — R69 Illness, unspecified: Secondary | ICD-10-CM | POA: Diagnosis not present
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
## 2022-07-31 ENCOUNTER — Ambulatory Visit (INDEPENDENT_AMBULATORY_CARE_PROVIDER_SITE_OTHER): Payer: 59 | Admitting: Psychiatry

## 2022-07-31 DIAGNOSIS — F4310 Post-traumatic stress disorder, unspecified: Secondary | ICD-10-CM

## 2022-07-31 DIAGNOSIS — R69 Illness, unspecified: Secondary | ICD-10-CM | POA: Diagnosis not present

## 2022-07-31 NOTE — Progress Notes (Signed)
      [facility] Counselor/Therapist Progress Note  Patient ID: Timothy Camacho, MRN: [id number],    Date: 07/31/2022  Time Spent: 59 minutes start time 11:03 AM end time 12:02 PM  Treatment Type: Individual Therapy  Reported Symptoms: anxiety, irritability, triggered responses  Mental Status Exam:  Appearance:   Casual and Neat     Behavior:  Appropriate  Motor:  Normal  Speech/Language:   Normal Rate  Affect:  Appropriate  Mood:  anxious  Thought process:  normal  Thought content:    WNL  Sensory/Perceptual disturbances:    WNL  Orientation:  oriented to person, place, time/date, and situation  Attention:  Good  Concentration:  Good  Memory:  WNL  Fund of knowledge:   Good  Insight:    Good  Judgment:   Good  Impulse Control:  Good   Risk Assessment: Danger to Self:  No Self-injurious Behavior: No Danger to Others: No Duty to Warn:no Physical Aggression / Violence:No  Access to Firearms a concern: No  Gang Involvement:No   Subjective: Patient was present for session. He shared that he starts his new job soon and that is a positive thing for him.  He reported feeling good about the potential.  He shared that his granddaughter is still recovering and he is seeing some positive changes with her.  Patient explained that he is noticing her perspective and how important it is to have the right perspective in different situations.  He shared he is also realizing that he has lots of triggers.  He addressed trigger of his wife making comments, suds level 7, negative cognition "I do not get it right" felt anger in his body.  Patient was able to reduce suds level to 2.  He was able to recognize that he does get things right he just may not get things perfect and that has to be okay.  He was able to recognize where that need to get things right was coming from and deal with that part of him.  Patient was encouraged to remind himself regularly of what he does right and that he does not  have to get things perfect.  Interventions: Eye Movement Desensitization and Reprocessing (EMDR) and Insight-Oriented  Diagnosis:   ICD-10-CM   1. PTSD (post-traumatic stress disorder)  F43.10       Plan: Patient is to use CBT and coping skills to decrease triggered responses.  Patient is to work on Investment banker, operational and focusing on the positive things that he does on a regular basis.  Patient is to start exercising daily to release negative emotions appropriately.  Patient is to work on regular affirmations reminding himself and the younger parts of him that he is enough.  Patient is to use visualizations to help him with maintaining emotions.  Patient is to work on Dr. Rayfield Citizen leaf's neuro cycle detox.  Patient is to take medication as directed.  Patient is to continue working with provider Corie Chiquito PMHNP  Stevphen Meuse, [facility]

## 2022-08-08 ENCOUNTER — Ambulatory Visit: Payer: 59 | Admitting: Psychiatry

## 2022-08-17 ENCOUNTER — Telehealth: Payer: Self-pay | Admitting: Psychiatry

## 2022-08-17 ENCOUNTER — Ambulatory Visit: Payer: 59 | Admitting: Psychiatry

## 2022-08-17 ENCOUNTER — Other Ambulatory Visit: Payer: Self-pay

## 2022-08-17 DIAGNOSIS — F900 Attention-deficit hyperactivity disorder, predominantly inattentive type: Secondary | ICD-10-CM

## 2022-08-17 MED ORDER — METHYLPHENIDATE HCL ER (CD) 30 MG PO CPCR: 30.0000 mg | ORAL_CAPSULE | ORAL | 0 refills | Status: DC

## 2022-08-17 NOTE — Telephone Encounter (Signed)
Next visit is 08/31/22. Timothy Camacho needs a refill on his Methylphenidate 30 mg. [facility] has #60 of these in stock per pharmacist. [facility] is:  [id number] [place], [place] - [address]  Phone:  [phone]  Fax:  [phone]

## 2022-08-17 NOTE — Telephone Encounter (Signed)
Pended.

## 2022-08-31 ENCOUNTER — Encounter: Payer: Self-pay | Admitting: Psychiatry

## 2022-08-31 ENCOUNTER — Ambulatory Visit (INDEPENDENT_AMBULATORY_CARE_PROVIDER_SITE_OTHER): Payer: 59 | Admitting: Psychiatry

## 2022-08-31 DIAGNOSIS — R69 Illness, unspecified: Secondary | ICD-10-CM | POA: Diagnosis not present

## 2022-08-31 DIAGNOSIS — F900 Attention-deficit hyperactivity disorder, predominantly inattentive type: Secondary | ICD-10-CM | POA: Diagnosis not present

## 2022-08-31 DIAGNOSIS — F32A Depression, unspecified: Secondary | ICD-10-CM

## 2022-08-31 DIAGNOSIS — F39 Unspecified mood [affective] disorder: Secondary | ICD-10-CM

## 2022-08-31 DIAGNOSIS — F410 Panic disorder [episodic paroxysmal anxiety] without agoraphobia: Secondary | ICD-10-CM

## 2022-08-31 DIAGNOSIS — F4310 Post-traumatic stress disorder, unspecified: Secondary | ICD-10-CM

## 2022-08-31 MED ORDER — PROPRANOLOL HCL 10 MG PO TABS: ORAL_TABLET | ORAL | 1 refills | Status: DC

## 2022-08-31 MED ORDER — ESCITALOPRAM OXALATE 20 MG PO TABS
20.0000 mg | ORAL_TABLET | Freq: Every day | ORAL | 0 refills | Status: DC
Start: 2022-08-31 — End: 2022-10-12

## 2022-08-31 MED ORDER — METHYLPHENIDATE HCL 20 MG PO TABS: 20.0000 mg | ORAL_TABLET | Freq: Every day | ORAL | 0 refills | Status: DC | PRN

## 2022-08-31 MED ORDER — METHYLPHENIDATE HCL ER (OSM) 36 MG PO TBCR: 36.0000 mg | EXTENDED_RELEASE_TABLET | Freq: Every day | ORAL | 0 refills | Status: DC

## 2022-08-31 NOTE — Progress Notes (Unsigned)
Timothy Camacho [id number] [birth year] 61 y.o.  Subjective:   Patient ID:  Timothy Camacho is a 61 y.o. (DOB Apr 24, 1961) male.  Chief Complaint:  Chief Complaint  Patient presents with   Sleeping Problem    HPI Timothy Camacho presents to the office today for follow-up of anxiety, depression, and ADHD.   Has a job with [employer] and started 08/06/22. He is working in Armed forces logistics/support/administrative officer. Works four 10-hour days, Monday-Thursday.   He has been having some "weird dreams." Had a dream involving his first supervisor. His wife has said he has been more vocal in his sleep. He recently was acting out a fight in his sleep. He has been going to bed earlier with new hours. He is no longer experiencing middle of the night awakenings with anxiety/panic. Denies SI.   He reports that he tends to think about different things. He reports some frustration when he feels that his ideas are not being well received.   Appetite has been good and has been eating healthier.   He reports that his pharmacy has been having difficulty getting Metadate CD. He has been taking Ritalin 20 mg mid-day.   Metadate last filled 08/17/22. Ritalin last filled 07/28/22.  Granddaughter has recovered from injury.   Past Medication Trials: Lexapro Celexa Buspar Wellbutrin XL Olanzapine Risperdal Trileptal Lamictal Gabapentin Topamax Naltrexone Klonopin NAC Concerta Methylphenidate CD- helpful Ritalin Adhansia- intrusive thoughts, grinding teeth Methylphenidate Propranolol Trazodone Pramipexole   Flowsheet Row ED from 04/29/2021 in [facility] EMERGENCY DEPARTMENT  C-SSRS RISK CATEGORY No Risk        Review of Systems:  Review of Systems  Medications: I have reviewed the patient's current medications.  Current Outpatient Medications  Medication Sig Dispense Refill   ezetimibe (ZETIA) 10 MG tablet Take 10 mg by mouth daily.     acetaminophen (TYLENOL) 500 MG tablet Take 500 mg  by mouth every 6 (six) hours as needed (pain).     allopurinol (ZYLOPRIM) 300 MG tablet Take 300 mg by mouth daily.     Cholecalciferol (VITAMIN D) 2000 units CAPS Take by mouth.     clonazePAM (KLONOPIN) 0.5 MG tablet Take 1 tablet (0.5 mg total) by mouth 2 (two) times daily. 60 tablet 2   escitalopram (LEXAPRO) 20 MG tablet Take 1 tablet (20 mg total) by mouth daily. 90 tablet 0   hydrochlorothiazide (HYDRODIURIL) 25 MG tablet Take 1 tablet (25 mg total) by mouth daily. 30 tablet 11   ibuprofen (ADVIL,MOTRIN) 200 MG tablet Take 800 mg by mouth every 6 (six) hours as needed for headache or moderate pain.     MAGNESIUM PO Take by mouth.     methylphenidate (METADATE CD) 30 MG CR capsule Take 1 capsule (30 mg total) by mouth daily. 30 capsule 0   methylphenidate (RITALIN) 20 MG tablet Take 1 tablet (20 mg total) by mouth daily as needed. 30 tablet 0   methylphenidate (RITALIN) 20 MG tablet Take 1 tablet (20 mg total) by mouth daily as needed. 30 tablet 0   Multiple Vitamin (MULTIVITAMIN) tablet Take 1 tablet by mouth daily.     potassium chloride (KLOR-CON) 10 MEQ tablet Take 1 tablet (10 mEq total) by mouth daily. 30 tablet 11   pramipexole (MIRAPEX) 0.5 MG tablet Take 1.5 tablets (0.75 mg total) by mouth every evening. 135 tablet 1   propranolol (INDERAL) 10 MG tablet TAKE 1 TO 2 TABLETS BY MOUTH TWICE A DAY AS NEEDED FOR ANXIETY 120  tablet 1   No current facility-administered medications for this visit.    Medication Side Effects: None  Allergies:  Allergies  Allergen Reactions   Risperidone And Related     intolerance   Trileptal [Oxcarbazepine]     intolerance    Past Medical History:  Diagnosis Date   Gout    History of peritonsillar abscess    RLS (restless legs syndrome)     Past Medical History, Surgical history, Social history, and Family history were reviewed and updated as appropriate.   Please see review of systems for further details on the patient's review from  today.   Objective:   Physical Exam:  BP 107/71   Pulse 82   Physical Exam Constitutional:      General: He is not in acute distress. Musculoskeletal:        General: No deformity.  Neurological:     Mental Status: He is alert and oriented to person, place, and time.     Coordination: Coordination normal.  Psychiatric:        Attention and Perception: Attention and perception normal. He does not perceive auditory or visual hallucinations.        Mood and Affect: Mood normal. Mood is not anxious or depressed. Affect is not labile, blunt, angry or inappropriate.        Speech: Speech normal.        Behavior: Behavior normal.        Thought Content: Thought content normal. Thought content is not paranoid or delusional. Thought content does not include homicidal or suicidal ideation. Thought content does not include homicidal or suicidal plan.        Cognition and Memory: Cognition and memory normal.        Judgment: Judgment normal.     Comments: Insight intact     Lab Review:     Component Value Date/Time   NA 139 04/29/2021 0026   NA 143 06/28/2020 1556   K 4.5 04/29/2021 0026   CL 105 04/29/2021 0026   CO2 29 04/29/2021 0026   GLUCOSE 126 (H) 04/29/2021 0026   BUN 15 04/29/2021 0026   BUN 15 06/28/2020 1556   CREATININE 1.11 04/29/2021 0026   CALCIUM 9.0 04/29/2021 0026   PROT 7.1 04/29/2021 0026   PROT 7.1 06/07/2020 1659   ALBUMIN 3.8 04/29/2021 0026   ALBUMIN 4.5 06/07/2020 1659   AST 33 04/29/2021 0026   ALT 33 04/29/2021 0026   ALKPHOS 45 04/29/2021 0026   BILITOT 0.6 04/29/2021 0026   BILITOT 0.3 06/07/2020 1659   GFRNONAA >60 04/29/2021 0026   GFRAA 93 06/28/2020 1556       Component Value Date/Time   WBC 9.7 04/29/2021 0026   RBC 5.17 04/29/2021 0026   HGB 15.8 04/29/2021 0026   HGB 14.7 06/07/2020 1659   HCT 47.5 04/29/2021 0026   HCT 42.8 06/07/2020 1659   PLT 262 04/29/2021 0026   PLT 265 06/07/2020 1659   MCV 91.9 04/29/2021 0026   MCV 92  06/07/2020 1659   MCH 30.6 04/29/2021 0026   MCHC 33.3 04/29/2021 0026   RDW 14.2 04/29/2021 0026   RDW 13.4 06/07/2020 1659   LYMPHSABS 2.4 04/29/2021 0026   MONOABS 1.3 (H) 04/29/2021 0026   EOSABS 0.4 04/29/2021 0026   BASOSABS 0.1 04/29/2021 0026    No results found for: "POCLITH", "LITHIUM"   No results found for: "PHENYTOIN", "PHENOBARB", "VALPROATE", "CBMZ"   .res Assessment: Plan:    Kamaury was  seen today for sleeping problem.  Diagnoses and all orders for this visit:  Attention deficit hyperactivity disorder (ADHD), predominantly inattentive type  Mild mood disorder (HCC)  PTSD (post-traumatic stress disorder)  Panic disorder     Please see After Visit Summary for patient specific instructions.  Future Appointments  Date Time Provider [place]  09/19/2022  8:00 AM Lina Sayre, [facility] CP-CP None  10/03/2022  8:00 AM Lina Sayre, [facility] CP-CP None  11/06/2022  5:00 PM Lina Sayre, [facility] CP-CP None    No orders of the defined types were placed in this encounter.   -------------------------------

## 2022-09-19 ENCOUNTER — Ambulatory Visit (INDEPENDENT_AMBULATORY_CARE_PROVIDER_SITE_OTHER): Payer: 59 | Admitting: Psychiatry

## 2022-09-19 DIAGNOSIS — R69 Illness, unspecified: Secondary | ICD-10-CM | POA: Diagnosis not present

## 2022-09-19 DIAGNOSIS — F4310 Post-traumatic stress disorder, unspecified: Secondary | ICD-10-CM | POA: Diagnosis not present

## 2022-09-19 NOTE — Progress Notes (Signed)
[facility] Counselor/Therapist Progress Note  Patient ID: Timothy Camacho, MRN: [id number],    Date: 09/19/2022  Time Spent: 44 minutes start time 8:06 AM end time 8:50 AM  Treatment Type: Individual Therapy  Reported Symptoms: agitation, anxiety, flashbacks, triggered responses, fatigue  Mental Status Exam:  Appearance:   Well Groomed     Behavior:  Appropriate  Motor:  Normal  Speech/Language:   Normal Rate  Affect:  Appropriate  Mood:  normal  Thought process:  normal  Thought content:    WNL  Sensory/Perceptual disturbances:    WNL  Orientation:  oriented to person, place, time/date, and situation  Attention:  Good  Concentration:  Good  Memory:  WNL  Fund of knowledge:   Good  Insight:    Good  Judgment:   Good  Impulse Control:  Good   Risk Assessment: Danger to Self:  No Self-injurious Behavior: No Danger to Others: No Duty to Warn:no Physical Aggression / Violence:No  Access to Firearms a concern: No  Gang Involvement:No   Subjective: Patient was present for session.  He shared that he is enjoying his new job and that is going well.  He shared that his granddaughter is doing well and is able to run around again. He does have flashbacks from the accident but is doing better.  Patient was able to acknowledge that her progress has made it much easier to deal with the situation.  He shared that he did not feel that needed to be addressed currently.  He went on to share the positive changes in his current work situation.  He shared that his blood pressure and health seem better since he has left the last job and with his current position.  Patient stated that he has been able to notice some things that need to be addressed and he has been able to share that with his supervisor and let it go.  He shared that is still a big issue for him and he struggles with not feeling of responsibility to fix things that are not in his control.  Taught patient the box theory and how  to use that when he starts feeling triggered so that he can maintain perspective and f let go of the things that he cannot fix control or change in or not his.  Patient was able to recognize having that visual and thinking through things differently would be helpful even though it was going to be a huge transition.  Discussed some ways he can talk himself through that.  Interventions: Cognitive Behavioral Therapy and Solution-Oriented/Positive Psychology  Diagnosis:   ICD-10-CM   1. PTSD (post-traumatic stress disorder)  F43.10       Plan: Patient is to use CBT and coping skills to decrease triggered responses.  Patient is to work on Designer, multimedia therapy that he was taught in session to help maintain perspective when working with others.  Patient is to work on Air traffic controller and focusing on the positive things that he does on a regular basis.  Patient is to start exercising daily to release negative emotions appropriately.  Patient is to work on regular affirmations reminding himself and the younger parts of him that he is enough.  Patient is to use visualizations to help him with maintaining emotions.  Patient is to work on Dr. Chrys Racer leaf's neuro cycle detox.  Patient is to take medication as directed.  Patient is to continue working with provider Thayer Headings PMHNP  [facility]  Dalbert Batman, [facility]

## 2022-10-01 DIAGNOSIS — M25562 Pain in left knee: Secondary | ICD-10-CM | POA: Diagnosis not present

## 2022-10-01 DIAGNOSIS — M25572 Pain in left ankle and joints of left foot: Secondary | ICD-10-CM | POA: Diagnosis not present

## 2022-10-03 ENCOUNTER — Ambulatory Visit: Payer: 59 | Admitting: Psychiatry

## 2022-10-04 DIAGNOSIS — M25562 Pain in left knee: Secondary | ICD-10-CM | POA: Diagnosis not present

## 2022-10-04 DIAGNOSIS — M25572 Pain in left ankle and joints of left foot: Secondary | ICD-10-CM | POA: Diagnosis not present

## 2022-10-12 ENCOUNTER — Encounter: Payer: Self-pay | Admitting: Psychiatry

## 2022-10-12 ENCOUNTER — Ambulatory Visit (INDEPENDENT_AMBULATORY_CARE_PROVIDER_SITE_OTHER): Payer: 59 | Admitting: Psychiatry

## 2022-10-12 DIAGNOSIS — F32A Depression, unspecified: Secondary | ICD-10-CM

## 2022-10-12 DIAGNOSIS — F900 Attention-deficit hyperactivity disorder, predominantly inattentive type: Secondary | ICD-10-CM

## 2022-10-12 DIAGNOSIS — F4310 Post-traumatic stress disorder, unspecified: Secondary | ICD-10-CM

## 2022-10-12 DIAGNOSIS — F410 Panic disorder [episodic paroxysmal anxiety] without agoraphobia: Secondary | ICD-10-CM | POA: Diagnosis not present

## 2022-10-12 DIAGNOSIS — R69 Illness, unspecified: Secondary | ICD-10-CM | POA: Diagnosis not present

## 2022-10-12 MED ORDER — ESCITALOPRAM OXALATE 20 MG PO TABS
20.0000 mg | ORAL_TABLET | Freq: Every day | ORAL | 0 refills | Status: DC
Start: 2022-10-12 — End: 2022-11-30

## 2022-10-12 MED ORDER — METHYLPHENIDATE HCL ER (OSM) 36 MG PO TBCR
36.0000 mg | EXTENDED_RELEASE_TABLET | Freq: Every day | ORAL | 0 refills | Status: DC
Start: 2022-11-09 — End: 2022-11-30

## 2022-10-12 MED ORDER — METHYLPHENIDATE HCL 20 MG PO TABS: 20.0000 mg | ORAL_TABLET | Freq: Every day | ORAL | 0 refills | Status: DC | PRN

## 2022-10-12 MED ORDER — METHYLPHENIDATE HCL ER (OSM) 36 MG PO TBCR: 36.0000 mg | EXTENDED_RELEASE_TABLET | Freq: Every day | ORAL | 0 refills | Status: DC

## 2022-10-12 NOTE — Progress Notes (Signed)
Timothy Camacho [id number] 07-04-1961 61 y.o.  Subjective:   Patient ID:  Timothy Camacho is a 61 y.o. (DOB June 15, 1961) male.  Chief Complaint: No chief complaint on file.   HPI Timothy Camacho presents to the office today for follow-up of anxiety, depression, ADHD, and insomnia.    He reports that Concerta has been effective for concentration, energy, and motivation. He reports that he notices he is more tired when he does not take Concerta.   He reports anxiety, "seems normal" and in response to severe stress. No longer "waking up with dread" like he did before his work with his previous employer. He reports, "even being out of work" and trying to find employment he did not have the high levels of anxiety he did at previous job. Denies any recent panic attacks. Occ intrusive memories of granddaughter's accident. Sleeping well and denies nightmares. Energy has been good throughout the day. Motivation has been good. He has been doing things he enjoys. Appetite has been "too much." Denies SI.   Takes Concerta around 5 am and Ritalin at 2 pm.  Klonopin last filled 0/93/23 Concerta last filled 5/57/32 Ritalin last filled 08/31/22  Past Medication Trials: Lexapro Celexa Buspar Wellbutrin XL Olanzapine Risperdal Trileptal Lamictal Gabapentin Topamax Naltrexone Klonopin NAC Concerta Methylphenidate CD- helpful Ritalin Adhansia- intrusive thoughts, grinding teeth Methylphenidate Propranolol Trazodone Pramipexole  Flowsheet Row ED from 04/29/2021 in [facility] No Risk        Review of Systems:  Review of Systems  Cardiovascular:  Negative for palpitations.  Musculoskeletal:  Negative for gait problem.       Recent knee swelling  Neurological:  Negative for tremors.  Psychiatric/Behavioral:         Please refer to HPI    Medications: I have reviewed the patient's current medications.  Current Outpatient  Medications  Medication Sig Dispense Refill  . allopurinol (ZYLOPRIM) 300 MG tablet Take 300 mg by mouth daily.    . Cholecalciferol (VITAMIN D) 2000 units CAPS Take by mouth.    . clonazePAM (KLONOPIN) 0.5 MG tablet Take 1 tablet (0.5 mg total) by mouth 2 (two) times daily. (Patient taking differently: Take 0.5 mg by mouth at bedtime.) 60 tablet 2  . ezetimibe (ZETIA) 10 MG tablet Take 10 mg by mouth daily.    Marland Kitchen MAGNESIUM PO Take by mouth.    . meloxicam (MOBIC) 15 MG tablet Take 15 mg by mouth daily.    . potassium chloride (KLOR-CON) 10 MEQ tablet Take 1 tablet (10 mEq total) by mouth daily. 30 tablet 11  . propranolol (INDERAL) 10 MG tablet TAKE 1 TO 2 TABLETS BY MOUTH TWICE A DAY AS NEEDED FOR ANXIETY 120 tablet 1  . acetaminophen (TYLENOL) 500 MG tablet Take 500 mg by mouth every 6 (six) hours as needed (pain). (Patient not taking: Reported on 10/12/2022)    . escitalopram (LEXAPRO) 20 MG tablet Take 1 tablet (20 mg total) by mouth daily. 90 tablet 0  . hydrochlorothiazide (HYDRODIURIL) 25 MG tablet Take 1 tablet (25 mg total) by mouth daily. (Patient not taking: Reported on 10/12/2022) 30 tablet 11  . ibuprofen (ADVIL,MOTRIN) 200 MG tablet Take 800 mg by mouth every 6 (six) hours as needed for headache or moderate pain. (Patient not taking: Reported on 10/12/2022)    . [START ON 11/09/2022] methylphenidate (CONCERTA) 36 MG PO CR tablet Take 1 tablet (36 mg total) by mouth daily. 30 tablet 0  .  methylphenidate (CONCERTA) 36 MG PO CR tablet Take 1 tablet (36 mg total) by mouth daily. 30 tablet 0  . methylphenidate (RITALIN) 20 MG tablet Take 1 tablet (20 mg total) by mouth daily as needed. 30 tablet 0  . [START ON 11/09/2022] methylphenidate (RITALIN) 20 MG tablet Take 1 tablet (20 mg total) by mouth daily as needed. 30 tablet 0  . Multiple Vitamin (MULTIVITAMIN) tablet Take 1 tablet by mouth daily. (Patient not taking: Reported on 10/12/2022)    . pramipexole (MIRAPEX) 0.5 MG tablet Take 1.5  tablets (0.75 mg total) by mouth every evening. 135 tablet 1   No current facility-administered medications for this visit.    Medication Side Effects: None  Allergies:  Allergies  Allergen Reactions  . Risperidone And Related     intolerance  . Trileptal [Oxcarbazepine]     intolerance    Past Medical History:  Diagnosis Date  . Gout   . History of peritonsillar abscess   . RLS (restless legs syndrome)     Past Medical History, Surgical history, Social history, and Family history were reviewed and updated as appropriate.   Please see review of systems for further details on the patient's review from today.   Objective:   Physical Exam:  BP 122/73   Pulse 74   Physical Exam  Lab Review:     Component Value Date/Time   NA 139 04/29/2021 0026   NA 143 06/28/2020 1556   K 4.5 04/29/2021 0026   CL 105 04/29/2021 0026   CO2 29 04/29/2021 0026   GLUCOSE 126 (H) 04/29/2021 0026   BUN 15 04/29/2021 0026   BUN 15 06/28/2020 1556   CREATININE 1.11 04/29/2021 0026   CALCIUM 9.0 04/29/2021 0026   PROT 7.1 04/29/2021 0026   PROT 7.1 06/07/2020 1659   ALBUMIN 3.8 04/29/2021 0026   ALBUMIN 4.5 06/07/2020 1659   AST 33 04/29/2021 0026   ALT 33 04/29/2021 0026   ALKPHOS 45 04/29/2021 0026   BILITOT 0.6 04/29/2021 0026   BILITOT 0.3 06/07/2020 1659   GFRNONAA >60 04/29/2021 0026   GFRAA 93 06/28/2020 1556       Component Value Date/Time   WBC 9.7 04/29/2021 0026   RBC 5.17 04/29/2021 0026   HGB 15.8 04/29/2021 0026   HGB 14.7 06/07/2020 1659   HCT 47.5 04/29/2021 0026   HCT 42.8 06/07/2020 1659   PLT 262 04/29/2021 0026   PLT 265 06/07/2020 1659   MCV 91.9 04/29/2021 0026   MCV 92 06/07/2020 1659   MCH 30.6 04/29/2021 0026   MCHC 33.3 04/29/2021 0026   RDW 14.2 04/29/2021 0026   RDW 13.4 06/07/2020 1659   LYMPHSABS 2.4 04/29/2021 0026   MONOABS 1.3 (H) 04/29/2021 0026   EOSABS 0.4 04/29/2021 0026   BASOSABS 0.1 04/29/2021 0026    No results found for:  "POCLITH", "LITHIUM"   No results found for: "PHENYTOIN", "PHENOBARB", "VALPROATE", "CBMZ"   .res Assessment: Plan:    Diagnoses and all orders for this visit:  Depression, unspecified depression type -     escitalopram (LEXAPRO) 20 MG tablet; Take 1 tablet (20 mg total) by mouth daily.  PTSD (post-traumatic stress disorder) -     escitalopram (LEXAPRO) 20 MG tablet; Take 1 tablet (20 mg total) by mouth daily.  Panic disorder -     escitalopram (LEXAPRO) 20 MG tablet; Take 1 tablet (20 mg total) by mouth daily.  Attention deficit hyperactivity disorder (ADHD), predominantly inattentive type -  methylphenidate (CONCERTA) 36 MG PO CR tablet; Take 1 tablet (36 mg total) by mouth daily. -     methylphenidate (CONCERTA) 36 MG PO CR tablet; Take 1 tablet (36 mg total) by mouth daily. -     methylphenidate (RITALIN) 20 MG tablet; Take 1 tablet (20 mg total) by mouth daily as needed.     Please see After Visit Summary for patient specific instructions.  Future Appointments  Date Time Provider Department Center  11/06/2022  5:00 PM Stevphen Meuse, [facility] CP-CP None    No orders of the defined types were placed in this encounter.   -------------------------------

## 2022-10-18 ENCOUNTER — Telehealth: Payer: Self-pay | Admitting: Psychiatry

## 2022-10-18 NOTE — Telephone Encounter (Signed)
Called pharmacy to see what the issue was with the 20 mg methylphenidate and was told that the medication was not in stock, that it would be ordered, and would be filled this afternoon. Notified patient.

## 2022-10-18 NOTE — Telephone Encounter (Signed)
Next visit is 11/30/22. Timothy Camacho went to the pharmacy to pick his prescription for Methylphenidate 20 mg up and pharmacy told him they can't fill it because Janett Billow had put a lock on it. He did pick up his Methylphenidate 36 mg yesterday. Could someone please call the pharmacy to see what is going on so he can get the 20 mg filled? Pharmacy is:  [id number] [address], [place]   Phone: [phone]  Fax: [phone]

## 2022-11-06 ENCOUNTER — Ambulatory Visit (INDEPENDENT_AMBULATORY_CARE_PROVIDER_SITE_OTHER): Payer: 59 | Admitting: Psychiatry

## 2022-11-30 ENCOUNTER — Ambulatory Visit: Payer: 59 | Admitting: Psychiatry

## 2022-11-30 ENCOUNTER — Encounter: Payer: Self-pay | Admitting: Psychiatry

## 2022-11-30 DIAGNOSIS — M170 Bilateral primary osteoarthritis of knee: Secondary | ICD-10-CM | POA: Diagnosis not present

## 2022-11-30 DIAGNOSIS — F900 Attention-deficit hyperactivity disorder, predominantly inattentive type: Secondary | ICD-10-CM | POA: Diagnosis not present

## 2022-11-30 DIAGNOSIS — F410 Panic disorder [episodic paroxysmal anxiety] without agoraphobia: Secondary | ICD-10-CM

## 2022-11-30 DIAGNOSIS — G2581 Restless legs syndrome: Secondary | ICD-10-CM

## 2022-11-30 DIAGNOSIS — R69 Illness, unspecified: Secondary | ICD-10-CM | POA: Diagnosis not present

## 2022-11-30 DIAGNOSIS — G4700 Insomnia, unspecified: Secondary | ICD-10-CM | POA: Diagnosis not present

## 2022-11-30 DIAGNOSIS — F32A Depression, unspecified: Secondary | ICD-10-CM

## 2022-11-30 DIAGNOSIS — F4310 Post-traumatic stress disorder, unspecified: Secondary | ICD-10-CM | POA: Diagnosis not present

## 2022-11-30 MED ORDER — METHYLPHENIDATE HCL ER (OSM) 36 MG PO TBCR: 36.0000 mg | EXTENDED_RELEASE_TABLET | Freq: Every day | ORAL | 0 refills | Status: DC

## 2022-11-30 MED ORDER — ESCITALOPRAM OXALATE 20 MG PO TABS: 20.0000 mg | ORAL_TABLET | Freq: Every day | ORAL | 0 refills | Status: DC

## 2022-11-30 MED ORDER — CLONAZEPAM 0.5 MG PO TABS: 0.5000 mg | ORAL_TABLET | Freq: Two times a day (BID) | ORAL | 5 refills | Status: DC

## 2022-11-30 MED ORDER — PROPRANOLOL HCL 10 MG PO TABS: ORAL_TABLET | ORAL | 1 refills | Status: DC

## 2022-11-30 MED ORDER — METHYLPHENIDATE HCL 20 MG PO TABS: 20.0000 mg | ORAL_TABLET | Freq: Every day | ORAL | 0 refills | Status: DC | PRN

## 2022-11-30 MED ORDER — PRAMIPEXOLE DIHYDROCHLORIDE 0.5 MG PO TABS: 0.7500 mg | ORAL_TABLET | Freq: Every evening | ORAL | 1 refills | Status: DC

## 2022-11-30 NOTE — Progress Notes (Signed)
Timothy Camacho [id number] Dec 16, 1961 61 y.o.  Subjective:   Patient ID:  Timothy Camacho is a 61 y.o. (DOB 17-Jan-1961) male.  Chief Complaint:  Chief Complaint  Patient presents with   Follow-up    ADHD, anxiety, and mood disturbance    HPI Timothy Camacho presents to the office today for follow-up of ADHD, anxiety, and mood disturbance. He reports that he has been physically tired and sore after long hours and prolonged standing. Energy has been good. He reports that his mood has been "for the most part, ok." He reports that his anxiety has been manageable. He has had some disrupted sleep with leg pain. Appetite has been ok. He reports that he is packing his lunch and eating healthier. Denies SI.   He has been walking more in the evenings. Has a new rescue dog, Rowdy, that is 80 lbs. His son is living with them. His in-laws have also been staying with him at different times.   He reports that he had a very positive performance review.   Has been dealing with water damage from September.   Taking Concerta around 0000000- 5 am. He will take Ritalin around 2-3 pm. Concerta filled [id number]. Ritalin last filled 11/26/22.   Klonopin last filled 11/11/22.   Past Medication Trials: Lexapro Celexa Buspar Wellbutrin XL Olanzapine Risperdal Trileptal Lamictal Gabapentin Topamax Naltrexone Klonopin NAC Concerta Methylphenidate CD- helpful Ritalin Adhansia- intrusive thoughts, grinding teeth Methylphenidate Propranolol Trazodone Pramipexole  Flowsheet Row ED from 04/29/2021 in [place] No Risk        Review of Systems:  Review of Systems  Musculoskeletal:  Negative for gait problem.  Neurological:  Negative for tremors.  Psychiatric/Behavioral:         Please refer to HPI  Had COVID 2 weeks ago.   Medications: I have reviewed the patient's current medications.  Current Outpatient Medications  Medication  Sig Dispense Refill   meloxicam (MOBIC) 15 MG tablet Take 15 mg by mouth daily.     acetaminophen (TYLENOL) 500 MG tablet Take 500 mg by mouth every 6 (six) hours as needed (pain). (Patient not taking: Reported on 10/12/2022)     allopurinol (ZYLOPRIM) 300 MG tablet Take 300 mg by mouth daily.     Cholecalciferol (VITAMIN D) 2000 units CAPS Take by mouth.     [START ON 12/09/2022] clonazePAM (KLONOPIN) 0.5 MG tablet Take 1 tablet (0.5 mg total) by mouth 2 (two) times daily. 60 tablet 5   escitalopram (LEXAPRO) 20 MG tablet Take 1 tablet (20 mg total) by mouth daily. 90 tablet 0   ezetimibe (ZETIA) 10 MG tablet Take 10 mg by mouth daily.     hydrochlorothiazide (HYDRODIURIL) 25 MG tablet Take 1 tablet (25 mg total) by mouth daily. (Patient not taking: Reported on 10/12/2022) 30 tablet 11   ibuprofen (ADVIL,MOTRIN) 200 MG tablet Take 800 mg by mouth every 6 (six) hours as needed for headache or moderate pain. (Patient not taking: Reported on 10/12/2022)     MAGNESIUM PO Take by mouth.     [START ON 12/25/2022] methylphenidate (CONCERTA) 36 MG PO CR tablet Take 1 tablet (36 mg total) by mouth daily. 30 tablet 0   [START ON 01/22/2023] methylphenidate (CONCERTA) 36 MG PO CR tablet Take 1 tablet (36 mg total) by mouth daily. 30 tablet 0   [START ON 12/24/2022] methylphenidate (RITALIN) 20 MG tablet Take 1 tablet (20 mg total) by mouth daily as needed.  30 tablet 0   [START ON 01/21/2023] methylphenidate (RITALIN) 20 MG tablet Take 1 tablet (20 mg total) by mouth daily as needed. 30 tablet 0   Multiple Vitamin (MULTIVITAMIN) tablet Take 1 tablet by mouth daily. (Patient not taking: Reported on 10/12/2022)     potassium chloride (KLOR-CON) 10 MEQ tablet Take 1 tablet (10 mEq total) by mouth daily. 30 tablet 11   pramipexole (MIRAPEX) 0.5 MG tablet Take 1.5 tablets (0.75 mg total) by mouth every evening. 135 tablet 1   propranolol (INDERAL) 10 MG tablet TAKE 1 TO 2 TABLETS BY MOUTH TWICE A DAY AS NEEDED FOR ANXIETY  120 tablet 1   No current facility-administered medications for this visit.    Medication Side Effects: None  Allergies:  Allergies  Allergen Reactions   Risperidone And Related     intolerance   Trileptal [Oxcarbazepine]     intolerance    Past Medical History:  Diagnosis Date   Gout    History of peritonsillar abscess    RLS (restless legs syndrome)     Past Medical History, Surgical history, Social history, and Family history were reviewed and updated as appropriate.   Please see review of systems for further details on the patient's review from today.   Objective:   Physical Exam:  BP 122/72   Pulse 62   Wt 226 lb (102.5 kg)   BMI 32.43 kg/m   Physical Exam Constitutional:      General: He is not in acute distress. Musculoskeletal:        General: No deformity.  Neurological:     Mental Status: He is alert and oriented to person, place, and time.     Coordination: Coordination normal.  Psychiatric:        Attention and Perception: Attention and perception normal. He does not perceive auditory or visual hallucinations.        Mood and Affect: Mood normal. Mood is not anxious or depressed. Affect is not labile, blunt, angry or inappropriate.        Speech: Speech normal.        Behavior: Behavior normal.        Thought Content: Thought content normal. Thought content is not paranoid or delusional. Thought content does not include homicidal or suicidal ideation. Thought content does not include homicidal or suicidal plan.        Cognition and Memory: Cognition and memory normal.        Judgment: Judgment normal.     Comments: Insight intact     Lab Review:     Component Value Date/Time   NA 139 04/29/2021 0026   NA 143 06/28/2020 1556   K 4.5 04/29/2021 0026   CL 105 04/29/2021 0026   CO2 29 04/29/2021 0026   GLUCOSE 126 (H) 04/29/2021 0026   BUN 15 04/29/2021 0026   BUN 15 06/28/2020 1556   CREATININE 1.11 04/29/2021 0026   CALCIUM 9.0 04/29/2021  0026   PROT 7.1 04/29/2021 0026   PROT 7.1 06/07/2020 1659   ALBUMIN 3.8 04/29/2021 0026   ALBUMIN 4.5 06/07/2020 1659   AST 33 04/29/2021 0026   ALT 33 04/29/2021 0026   ALKPHOS 45 04/29/2021 0026   BILITOT 0.6 04/29/2021 0026   BILITOT 0.3 06/07/2020 1659   GFRNONAA >60 04/29/2021 0026   GFRAA 93 06/28/2020 1556       Component Value Date/Time   WBC 9.7 04/29/2021 0026   RBC 5.17 04/29/2021 0026   HGB 15.8 04/29/2021  0026   HGB 14.7 06/07/2020 1659   HCT 47.5 04/29/2021 0026   HCT 42.8 06/07/2020 1659   PLT 262 04/29/2021 0026   PLT 265 06/07/2020 1659   MCV 91.9 04/29/2021 0026   MCV 92 06/07/2020 1659   MCH 30.6 04/29/2021 0026   MCHC 33.3 04/29/2021 0026   RDW 14.2 04/29/2021 0026   RDW 13.4 06/07/2020 1659   LYMPHSABS 2.4 04/29/2021 0026   MONOABS 1.3 (H) 04/29/2021 0026   EOSABS 0.4 04/29/2021 0026   BASOSABS 0.1 04/29/2021 0026    No results found for: "POCLITH", "LITHIUM"   No results found for: "PHENYTOIN", "PHENOBARB", "VALPROATE", "CBMZ"   .res Assessment: Plan:    Will continue current plan of care since target signs and symptoms are well controlled without any tolerability issues. Continue Lexapro 20 mg daily for mood and anxiety. Continue Klonopin 0.5 mg po QHS for RLS and insomnia.  Continue Concerta 36 mg daily in the morning for ADHD. Continue Ritalin 20 mg daily as needed.  Continue Propranolol 10 mg 1-2 tabs po BID prn anxiety.  Continue Pramipexole 0.75 mg po every evening RLS. Recommend continuing therapy with Stevphen Meuse, [facility].  Pt to follow-up in 2 months or sooner if clinically indicated.  Patient advised to contact office with any questions, adverse effects, or acute worsening in signs and symptoms.   [place] was seen today for follow-up.  Diagnoses and all orders for this visit:  Attention deficit hyperactivity disorder (ADHD), predominantly inattentive type -     methylphenidate (RITALIN) 20 MG tablet; Take 1 tablet (20 mg  total) by mouth daily as needed. -     methylphenidate (CONCERTA) 36 MG PO CR tablet; Take 1 tablet (36 mg total) by mouth daily. -     methylphenidate (RITALIN) 20 MG tablet; Take 1 tablet (20 mg total) by mouth daily as needed. -     methylphenidate (CONCERTA) 36 MG PO CR tablet; Take 1 tablet (36 mg total) by mouth daily.  Panic disorder -     escitalopram (LEXAPRO) 20 MG tablet; Take 1 tablet (20 mg total) by mouth daily. -     clonazePAM (KLONOPIN) 0.5 MG tablet; Take 1 tablet (0.5 mg total) by mouth 2 (two) times daily.  PTSD (post-traumatic stress disorder) -     escitalopram (LEXAPRO) 20 MG tablet; Take 1 tablet (20 mg total) by mouth daily. -     propranolol (INDERAL) 10 MG tablet; TAKE 1 TO 2 TABLETS BY MOUTH TWICE A DAY AS NEEDED FOR ANXIETY  Depression, unspecified depression type -     escitalopram (LEXAPRO) 20 MG tablet; Take 1 tablet (20 mg total) by mouth daily.  RLS (restless legs syndrome) -     clonazePAM (KLONOPIN) 0.5 MG tablet; Take 1 tablet (0.5 mg total) by mouth 2 (two) times daily. -     pramipexole (MIRAPEX) 0.5 MG tablet; Take 1.5 tablets (0.75 mg total) by mouth every evening.  Insomnia, unspecified type -     clonazePAM (KLONOPIN) 0.5 MG tablet; Take 1 tablet (0.5 mg total) by mouth 2 (two) times daily.     Please see After Visit Summary for patient specific instructions.  Future Appointments  Date Time Provider Department Center  12/03/2022 11:00 AM Stevphen Meuse, [facility] None  02/01/2023 11:00 AM Corie Chiquito, PMHNP CP-CP None  02/05/2023  5:00 PM Stevphen Meuse, [facility] CP-CP None  03/05/2023  5:00 PM Stevphen Meuse, [facility] CP-CP None    No orders of the defined types were  placed in this encounter.   -------------------------------

## 2022-12-03 ENCOUNTER — Ambulatory Visit (INDEPENDENT_AMBULATORY_CARE_PROVIDER_SITE_OTHER): Payer: No Typology Code available for payment source | Admitting: Psychiatry

## 2022-12-03 DIAGNOSIS — R69 Illness, unspecified: Secondary | ICD-10-CM | POA: Diagnosis not present

## 2022-12-03 DIAGNOSIS — F4310 Post-traumatic stress disorder, unspecified: Secondary | ICD-10-CM

## 2022-12-03 NOTE — Progress Notes (Signed)
      [facility] Counselor/Therapist Progress Note  Patient ID: Timothy Camacho, MRN: [id number],    Date: 12/03/2022  Time Spent: 50 minutes start time 11:05 AM end time 11:55 AM  Treatment Type: Individual Therapy  Reported Symptoms: chronic pain, triggered responses, anxiety  Mental Status Exam:  Appearance:   Casual and Neat     Behavior:  Appropriate  Motor:  Normal  Speech/Language:   Normal Rate  Affect:  Appropriate  Mood:  normal  Thought process:  normal  Thought content:    WNL  Sensory/Perceptual disturbances:    WNL  Orientation:  oriented to person, place, time/date, and situation  Attention:  Good  Concentration:  Good  Memory:  WNL  Fund of knowledge:   Good  Insight:    Good  Judgment:   Good  Impulse Control:  Good   Risk Assessment: Danger to Self:  No Self-injurious Behavior: No Danger to Others: No Duty to Warn:no Physical Aggression / Violence:No  Access to Firearms a concern: No  Gang Involvement:No   Subjective: Patient was present for session. He is adjusting to his new job. He shared he has some arthritis in his knee so he is having some pain.  Patient went on to discuss his evaluation and his new job and how they have encouraged him to try and slow down and take things 1 step at a time.  Patient was able to recognize that the whole change of mind set for him and that is going to be very difficult especially with the way he was raised.  Patient discussed some CBT filters he can utilize to help him keep things slowing down and the importance of perspective.  Patient reported that he is working on remembering to focus on the things he can control fix and changes discussed in previous sessions and that is helping him stay at a better place.  Patient stated that other things at his house are okay overall.  He was able to get a dog to help him have some relaxation and distraction from different stressors and that has been very helpful as well.  Patient  was encouraged to continue remembering to use his coping skills and focus on things 1 step at a time it was agreed that once sessions get more consistent he can do some more trauma work.  Interventions: Cognitive Behavioral Therapy and Solution-Oriented/Positive Psychology  Diagnosis:   ICD-10-CM   1. PTSD (post-traumatic stress disorder)  F43.10       Plan: Patient is to use CBT and coping skills to decrease triggered responses.  Patient is to work on Energy manager therapy to help maintain perspective when working with others.  Patient is to work on Investment banker, operational and focusing on the positive things that he does on a regular basis.  Patient is to start exercising daily to release negative emotions appropriately.  Patient is to work on regular affirmations reminding himself and the younger parts of him that he is enough.  Patient is to use visualizations to help him with maintaining emotions.  Patient is to work on Dr. Rayfield Citizen leaf's neuro cycle detox.  Patient is to take medication as directed.  Patient is to continue working with provider Corie Chiquito PMHNP    Stevphen Meuse, [place]

## 2022-12-28 ENCOUNTER — Telehealth: Payer: Self-pay | Admitting: Psychiatry

## 2022-12-28 DIAGNOSIS — F900 Attention-deficit hyperactivity disorder, predominantly inattentive type: Secondary | ICD-10-CM

## 2022-12-28 MED ORDER — METHYLPHENIDATE HCL 20 MG PO TABS: 20.0000 mg | ORAL_TABLET | Freq: Every day | ORAL | 0 refills | Status: DC | PRN

## 2022-12-28 NOTE — Telephone Encounter (Signed)
Please let him know it was sent to [facility] [employer] on [address].

## 2022-12-28 NOTE — Telephone Encounter (Signed)
Left voice mail advising Pt Rx sent

## 2022-12-28 NOTE — Telephone Encounter (Signed)
Pt reporting [facility] [address] back order Methylphenidate 20 mg. [facility] to [facility] [employer] or print Rx to pick up. Which ever is best.

## 2023-01-07 DIAGNOSIS — R5382 Chronic fatigue, unspecified: Secondary | ICD-10-CM | POA: Diagnosis not present

## 2023-01-07 DIAGNOSIS — E669 Obesity, unspecified: Secondary | ICD-10-CM | POA: Diagnosis not present

## 2023-01-07 DIAGNOSIS — G2581 Restless legs syndrome: Secondary | ICD-10-CM | POA: Diagnosis not present

## 2023-01-07 DIAGNOSIS — R7301 Impaired fasting glucose: Secondary | ICD-10-CM | POA: Diagnosis not present

## 2023-01-07 DIAGNOSIS — E785 Hyperlipidemia, unspecified: Secondary | ICD-10-CM | POA: Diagnosis not present

## 2023-01-07 DIAGNOSIS — Z0000 Encounter for general adult medical examination without abnormal findings: Secondary | ICD-10-CM | POA: Diagnosis not present

## 2023-01-07 DIAGNOSIS — K760 Fatty (change of) liver, not elsewhere classified: Secondary | ICD-10-CM | POA: Diagnosis not present

## 2023-01-07 DIAGNOSIS — M109 Gout, unspecified: Secondary | ICD-10-CM | POA: Diagnosis not present

## 2023-01-07 DIAGNOSIS — R69 Illness, unspecified: Secondary | ICD-10-CM | POA: Diagnosis not present

## 2023-01-07 DIAGNOSIS — E559 Vitamin D deficiency, unspecified: Secondary | ICD-10-CM | POA: Diagnosis not present

## 2023-01-07 DIAGNOSIS — Z23 Encounter for immunization: Secondary | ICD-10-CM | POA: Diagnosis not present

## 2023-01-07 DIAGNOSIS — I700 Atherosclerosis of aorta: Secondary | ICD-10-CM | POA: Diagnosis not present

## 2023-01-08 ENCOUNTER — Other Ambulatory Visit: Payer: Self-pay | Admitting: Internal Medicine

## 2023-01-08 DIAGNOSIS — E785 Hyperlipidemia, unspecified: Secondary | ICD-10-CM

## 2023-02-01 ENCOUNTER — Encounter: Payer: Self-pay | Admitting: Psychiatry

## 2023-02-01 ENCOUNTER — Telehealth (INDEPENDENT_AMBULATORY_CARE_PROVIDER_SITE_OTHER): Payer: No Typology Code available for payment source | Admitting: Psychiatry

## 2023-02-01 DIAGNOSIS — F4310 Post-traumatic stress disorder, unspecified: Secondary | ICD-10-CM

## 2023-02-01 DIAGNOSIS — F410 Panic disorder [episodic paroxysmal anxiety] without agoraphobia: Secondary | ICD-10-CM

## 2023-02-01 DIAGNOSIS — F900 Attention-deficit hyperactivity disorder, predominantly inattentive type: Secondary | ICD-10-CM

## 2023-02-01 DIAGNOSIS — F32A Depression, unspecified: Secondary | ICD-10-CM | POA: Diagnosis not present

## 2023-02-01 MED ORDER — METHYLPHENIDATE HCL 20 MG PO TABS: 20.0000 mg | ORAL_TABLET | Freq: Every day | ORAL | 0 refills | Status: DC | PRN

## 2023-02-01 MED ORDER — ESCITALOPRAM OXALATE 20 MG PO TABS: 20.0000 mg | ORAL_TABLET | Freq: Every day | ORAL | 1 refills | Status: DC

## 2023-02-01 MED ORDER — METHYLPHENIDATE HCL ER (OSM) 36 MG PO TBCR: 36.0000 mg | EXTENDED_RELEASE_TABLET | Freq: Every day | ORAL | 0 refills | Status: DC

## 2023-02-01 NOTE — Progress Notes (Signed)
Timothy Camacho [id number] [birth year] 62 y.o.  Virtual Visit via Video Note  I connected with pt @ on 02/01/23 at 11:00 AM EST by a video enabled telemedicine application and verified that I am speaking with the correct person using two identifiers.   I discussed the limitations of evaluation and management by telemedicine and the availability of in person appointments. The patient expressed understanding and agreed to proceed.  I discussed the assessment and treatment plan with the patient. The patient was provided an opportunity to ask questions and all were answered. The patient agreed with the plan and demonstrated an understanding of the instructions.   The patient was advised to call back or seek an in-person evaluation if the symptoms worsen or if the condition fails to improve as anticipated.  I provided 36 minutes of non-face-to-face time during this encounter.  The patient was located at home.  The provider was located at home.   Thayer Headings, PMHNP   Subjective:   Patient ID:  Timothy Camacho is a 62 y.o. (DOB November 24, 1961) male.  Chief Complaint:  Chief Complaint  Patient presents with   Anxiety    Anxiety Patient reports no palpitations.     Timothy Camacho presents for follow-up of anxiety, depression, and ADHD.   He reports increased work load at his job. He reports that his energy is low at the end of the day.  He reports that EEOC hearing/mediation is scheduled for next month. He reports that this is causing him some increased anxiety and "bringing things back up." Noticing some worry. He reports that he has some hyper-vigilance and catastrophic thoughts in response to work situations. He reports some intrusive memories and dreams/nightmares related to past events. Denies panic attacks.   He reports that pending mediation has "brought up a sense of doom." He reports motivation is somewhat low. He reports that he recently slept "all day" on a day off. Denies  difficulty falling asleep. He reports not feeling rested upon awakening. Appetite has been ok. Denies SI.   He has been taking Concerta 36 mg in the morning and then Methylphenidate in the afternoon. He is starting his day earlier at 6 am and working 10 hour shifts.  He reports that he feels "totally drained" once Methylphenidate wears off around 3:30-4 pm.   Enjoying his dog. He has been walking and training his dog.   Wife was in an [place] in December and her vehicle was totaled. Wife had some injuries to include a concussion and bruising.   Klonopin last filled 12/29/22 from 07/10/22. Methylphenidate 20 last filled 12/28/22. Concerta last filled [id number].  Past Medication Trials: Lexapro Celexa Buspar Wellbutrin XL Olanzapine Risperdal Trileptal Lamictal Gabapentin Topamax Naltrexone Klonopin NAC Concerta Methylphenidate CD- helpful Ritalin Adhansia- intrusive thoughts, grinding teeth Methylphenidate Propranolol Trazodone Pramipexole  Review of Systems:  Review of Systems  Cardiovascular:  Negative for palpitations.  Musculoskeletal:  Positive for arthralgias. Negative for gait problem.       Knee pain  Psychiatric/Behavioral:         Please refer to HPI    Medications: I have reviewed the patient's current medications.  Current Outpatient Medications  Medication Sig Dispense Refill   allopurinol (ZYLOPRIM) 300 MG tablet Take 300 mg by mouth daily.     celecoxib (CELEBREX) 200 MG capsule Take 200 mg by mouth daily.     Cholecalciferol (VITAMIN D) 2000 units CAPS Take by mouth.     clonazePAM (KLONOPIN) 0.5 MG tablet Take  1 tablet (0.5 mg total) by mouth 2 (two) times daily. (Patient taking differently: Take 0.5 mg by mouth at bedtime.) 60 tablet 5   ezetimibe (ZETIA) 10 MG tablet Take 10 mg by mouth daily.     MAGNESIUM PO Take by mouth.     potassium chloride (KLOR-CON) 10 MEQ tablet Take 1 tablet (10 mEq total) by mouth daily. 30 tablet 11   pramipexole (MIRAPEX)  0.5 MG tablet Take 1.5 tablets (0.75 mg total) by mouth every evening. 135 tablet 1   propranolol (INDERAL) 10 MG tablet TAKE 1 TO 2 TABLETS BY MOUTH TWICE A DAY AS NEEDED FOR ANXIETY 120 tablet 1   acetaminophen (TYLENOL) 500 MG tablet Take 500 mg by mouth every 6 (six) hours as needed (pain). (Patient not taking: Reported on 10/12/2022)     escitalopram (LEXAPRO) 20 MG tablet Take 1 tablet (20 mg total) by mouth daily. 90 tablet 1   hydrochlorothiazide (HYDRODIURIL) 25 MG tablet Take 1 tablet (25 mg total) by mouth daily. (Patient not taking: Reported on 10/12/2022) 30 tablet 11   ibuprofen (ADVIL,MOTRIN) 200 MG tablet Take 800 mg by mouth every 6 (six) hours as needed for headache or moderate pain. (Patient not taking: Reported on 10/12/2022)     [START ON 03/28/2023] methylphenidate (CONCERTA) 36 MG PO CR tablet Take 1 tablet (36 mg total) by mouth daily. 30 tablet 0   [START ON 02/28/2023] methylphenidate (CONCERTA) 36 MG PO CR tablet Take 1 tablet (36 mg total) by mouth daily. 30 tablet 0   [START ON 02/28/2023] methylphenidate (RITALIN) 20 MG tablet Take 1 tablet (20 mg total) by mouth daily as needed. 30 tablet 0   [START ON 03/28/2023] methylphenidate (RITALIN) 20 MG tablet Take 1 tablet (20 mg total) by mouth daily as needed. 30 tablet 0   Multiple Vitamin (MULTIVITAMIN) tablet Take 1 tablet by mouth daily. (Patient not taking: Reported on 10/12/2022)     No current facility-administered medications for this visit.    Medication Side Effects: None  Allergies:  Allergies  Allergen Reactions   Risperidone And Related     intolerance   Trileptal [Oxcarbazepine]     intolerance    Past Medical History:  Diagnosis Date   Gout    History of peritonsillar abscess    RLS (restless legs syndrome)     Family History  Problem Relation Age of Onset   Cancer Mother        breast   Diabetes Mother    Alcohol abuse Father    CVA Maternal Aunt    Suicidality Maternal Uncle    Alcohol  abuse Paternal Uncle     Social History   Socioeconomic History   Marital status: Married    Spouse name: Not on file   Number of children: Not on file   Years of education: Not on file   Highest education level: Not on file  Occupational History   Not on file  Tobacco Use   Smoking status: Former   Smokeless tobacco: Never  Vaping Use   Vaping Use: Never used  Substance and Sexual Activity   Alcohol use: Yes    Comment: every other day    Drug use: No   Sexual activity: Not on file  Other Topics Concern   Not on file  Social History Narrative   Not on file   Social Determinants of Health   Financial Resource Strain: Not on file  Food Insecurity: Not on file  Transportation  Needs: Not on file  Physical Activity: Not on file  Stress: Not on file  Social Connections: Not on file  Intimate Partner Violence: Not on file    Past Medical History, Surgical history, Social history, and Family history were reviewed and updated as appropriate.   Please see review of systems for further details on the patient's review from today.   Objective:   Physical Exam:  There were no vitals taken for this visit.  Physical Exam Neurological:     Mental Status: He is alert and oriented to person, place, and time.     Cranial Nerves: No dysarthria.  Psychiatric:        Attention and Perception: Attention and perception normal.        Mood and Affect: Mood is anxious.        Speech: Speech normal.        Behavior: Behavior is cooperative.        Thought Content: Thought content normal. Thought content is not paranoid or delusional. Thought content does not include homicidal or suicidal ideation. Thought content does not include homicidal or suicidal plan.        Cognition and Memory: Cognition and memory normal.        Judgment: Judgment normal.     Comments: Insight intact     Lab Review:     Component Value Date/Time   NA 139 04/29/2021 0026   NA 143 06/28/2020 1556   K  4.5 04/29/2021 0026   CL 105 04/29/2021 0026   CO2 29 04/29/2021 0026   GLUCOSE 126 (H) 04/29/2021 0026   BUN 15 04/29/2021 0026   BUN 15 06/28/2020 1556   CREATININE 1.11 04/29/2021 0026   CALCIUM 9.0 04/29/2021 0026   PROT 7.1 04/29/2021 0026   PROT 7.1 06/07/2020 1659   ALBUMIN 3.8 04/29/2021 0026   ALBUMIN 4.5 06/07/2020 1659   AST 33 04/29/2021 0026   ALT 33 04/29/2021 0026   ALKPHOS 45 04/29/2021 0026   BILITOT 0.6 04/29/2021 0026   BILITOT 0.3 06/07/2020 1659   GFRNONAA >60 04/29/2021 0026   GFRAA 93 06/28/2020 1556       Component Value Date/Time   WBC 9.7 04/29/2021 0026   RBC 5.17 04/29/2021 0026   HGB 15.8 04/29/2021 0026   HGB 14.7 06/07/2020 1659   HCT 47.5 04/29/2021 0026   HCT 42.8 06/07/2020 1659   PLT 262 04/29/2021 0026   PLT 265 06/07/2020 1659   MCV 91.9 04/29/2021 0026   MCV 92 06/07/2020 1659   MCH 30.6 04/29/2021 0026   MCHC 33.3 04/29/2021 0026   RDW 14.2 04/29/2021 0026   RDW 13.4 06/07/2020 1659   LYMPHSABS 2.4 04/29/2021 0026   MONOABS 1.3 (H) 04/29/2021 0026   EOSABS 0.4 04/29/2021 0026   BASOSABS 0.1 04/29/2021 0026    No results found for: "POCLITH", "LITHIUM"   No results found for: "PHENYTOIN", "PHENOBARB", "VALPROATE", "CBMZ"   .res Assessment: Plan:   Pt seen for 35 minutes and time spent discussing treatment options. May consider Jornay PM in the future if/when insurance changes since this may help with feeling medication take effect and wear off at different times throughout the day. One[place] Alar PM appears to be a coverage exclusion under current plan. Discussed that a sleep study may be helpful to rule out sleep apnea as cause of chronic fatigue regardless of sleep amount. He reports that he may also consider sleep study if/when benefits change due to possible cost associated with  study. Will continue Concerta 36 mg in the morning and Ritalin 20 mg daily for ADHD.  Continue Lexapro 20 mg po qd for anxiety and depression.   Continue  Pramipexole for RLS.  Continue Propranolol prn anxiety.  Continue Klonopin 0.5 mg po QHS for anxiety, insomnia, and RLS.  Recommend continuing therapy with Lina Sayre, [facility].  Pt to follow-up in 2 months or sooner if clinically indicated.  Patient advised to contact office with any questions, adverse effects, or acute worsening in signs and symptoms.   Garet was seen today for anxiety.  Diagnoses and all orders for this visit:  Panic disorder -     escitalopram (LEXAPRO) 20 MG tablet; Take 1 tablet (20 mg total) by mouth daily.  PTSD (post-traumatic stress disorder) -     escitalopram (LEXAPRO) 20 MG tablet; Take 1 tablet (20 mg total) by mouth daily.  Depression, unspecified depression type -     escitalopram (LEXAPRO) 20 MG tablet; Take 1 tablet (20 mg total) by mouth daily.  Attention deficit hyperactivity disorder (ADHD), predominantly inattentive type -     methylphenidate (CONCERTA) 36 MG PO CR tablet; Take 1 tablet (36 mg total) by mouth daily. -     methylphenidate (RITALIN) 20 MG tablet; Take 1 tablet (20 mg total) by mouth daily as needed. -     methylphenidate (RITALIN) 20 MG tablet; Take 1 tablet (20 mg total) by mouth daily as needed. -     methylphenidate (CONCERTA) 36 MG PO CR tablet; Take 1 tablet (36 mg total) by mouth daily.     Please see After Visit Summary for patient specific instructions.  Future Appointments  Date Time Provider [place]  02/05/2023  5:00 PM Lina Sayre, [facility] CP-CP None  02/15/2023  9:00 AM GI-315 CT 2 GI-315CT [facility]  03/05/2023  5:00 PM Lina Sayre, [facility] CP-CP None    No orders of the defined types were placed in this encounter.     -------------------------------

## 2023-02-05 ENCOUNTER — Ambulatory Visit: Payer: 59 | Admitting: Psychiatry

## 2023-02-15 ENCOUNTER — Ambulatory Visit
Admission: RE | Admit: 2023-02-15 | Discharge: 2023-02-15 | Disposition: A | Discharge status: Home / Self Care | Payer: No Typology Code available for payment source | Source: Ambulatory Visit | Attending: Internal Medicine | Admitting: Internal Medicine

## 2023-02-15 DIAGNOSIS — E785 Hyperlipidemia, unspecified: Secondary | ICD-10-CM

## 2023-02-27 ENCOUNTER — Other Ambulatory Visit (HOSPITAL_BASED_OUTPATIENT_CLINIC_OR_DEPARTMENT_OTHER): Payer: Self-pay | Admitting: Family Medicine

## 2023-02-27 DIAGNOSIS — S86912D Strain of unspecified muscle(s) and tendon(s) at lower leg level, left leg, subsequent encounter: Secondary | ICD-10-CM

## 2023-03-05 ENCOUNTER — Ambulatory Visit (INDEPENDENT_AMBULATORY_CARE_PROVIDER_SITE_OTHER): Payer: Self-pay | Admitting: Psychiatry

## 2023-03-05 DIAGNOSIS — F4310 Post-traumatic stress disorder, unspecified: Secondary | ICD-10-CM

## 2023-03-06 NOTE — Progress Notes (Signed)
Patient no showed for session

## 2023-03-07 ENCOUNTER — Ambulatory Visit (HOSPITAL_BASED_OUTPATIENT_CLINIC_OR_DEPARTMENT_OTHER)
Admission: RE | Admit: 2023-03-07 | Discharge: 2023-03-07 | Disposition: A | Discharge status: Home / Self Care | Payer: Worker's Compensation | Source: Ambulatory Visit | Attending: Family Medicine | Admitting: Family Medicine

## 2023-03-07 DIAGNOSIS — S86912D Strain of unspecified muscle(s) and tendon(s) at lower leg level, left leg, subsequent encounter: Secondary | ICD-10-CM | POA: Diagnosis present

## 2023-03-07 DIAGNOSIS — X58XXXD Exposure to other specified factors, subsequent encounter: Secondary | ICD-10-CM | POA: Insufficient documentation

## 2023-03-14 ENCOUNTER — Telehealth: Payer: Self-pay | Admitting: Psychiatry

## 2023-03-14 NOTE — Telephone Encounter (Signed)
Patient notified. Emailed signed copy to him.

## 2023-03-14 NOTE — Telephone Encounter (Signed)
Patient notified. I didn't know if note could be sent in [place]. He denied any SI or HI.

## 2023-03-14 NOTE — Telephone Encounter (Signed)
Letter printed, signed, and ready for pick-up. Unsigned letter sent via [place].

## 2023-03-14 NOTE — Telephone Encounter (Signed)
Patient had another appt nearby and came into the office. He had an incident at work and needs a letter for him to be able to return to work.  He wrote a letter explaining what happened that I put in your box.  He doesn't have FMLA and not trying to get it, not trying to get work accommodations, just needs a letter that says he can return to work. His next scheduled shift is Monday.

## 2023-03-14 NOTE — Telephone Encounter (Signed)
Pt LVM @ 5:12p.  He said he needs to talk to Altha.  Next appt 5/3

## 2023-04-04 ENCOUNTER — Encounter: Payer: Self-pay | Admitting: Psychiatry

## 2023-04-04 ENCOUNTER — Ambulatory Visit (INDEPENDENT_AMBULATORY_CARE_PROVIDER_SITE_OTHER): Payer: No Typology Code available for payment source | Admitting: Psychiatry

## 2023-04-04 VITALS — BP 130/80 | HR 85 | Wt 214.0 lb

## 2023-04-04 DIAGNOSIS — F900 Attention-deficit hyperactivity disorder, predominantly inattentive type: Secondary | ICD-10-CM

## 2023-04-04 DIAGNOSIS — F4310 Post-traumatic stress disorder, unspecified: Secondary | ICD-10-CM | POA: Diagnosis not present

## 2023-04-04 DIAGNOSIS — F410 Panic disorder [episodic paroxysmal anxiety] without agoraphobia: Secondary | ICD-10-CM

## 2023-04-04 DIAGNOSIS — G2581 Restless legs syndrome: Secondary | ICD-10-CM

## 2023-04-04 DIAGNOSIS — F32A Depression, unspecified: Secondary | ICD-10-CM | POA: Diagnosis not present

## 2023-04-04 MED ORDER — PRAMIPEXOLE DIHYDROCHLORIDE 0.5 MG PO TABS
0.7500 mg | ORAL_TABLET | Freq: Every evening | ORAL | 1 refills | Status: DC
Start: 2023-04-04 — End: 2023-09-30

## 2023-04-04 MED ORDER — METHYLPHENIDATE HCL 20 MG PO TABS
20.0000 mg | ORAL_TABLET | Freq: Every day | ORAL | 0 refills | Status: DC | PRN
Start: 2023-05-04 — End: 2023-08-09

## 2023-04-04 MED ORDER — METHYLPHENIDATE HCL ER (OSM) 36 MG PO TBCR
36.0000 mg | EXTENDED_RELEASE_TABLET | Freq: Every day | ORAL | 0 refills | Status: DC
Start: 2023-06-01 — End: 2023-09-02

## 2023-04-04 MED ORDER — METHYLPHENIDATE HCL ER (OSM) 36 MG PO TBCR: 36.0000 mg | EXTENDED_RELEASE_TABLET | Freq: Every day | ORAL | 0 refills | Status: DC

## 2023-04-04 MED ORDER — METHYLPHENIDATE HCL 20 MG PO TABS
20.0000 mg | ORAL_TABLET | Freq: Every day | ORAL | 0 refills | Status: DC
Start: 2023-06-01 — End: 2023-08-09

## 2023-04-04 NOTE — Progress Notes (Signed)
Timothy Camacho [id number] 04-18-1961 62 y.o.  Subjective:   Patient ID:  Timothy Camacho is a 62 y.o. (DOB January 04, 1961) male.  Chief Complaint:  Chief Complaint  Patient presents with   Follow-up    Anxiety, depression, and sleep disturbance    HPI Timothy Camacho presents to the office today for follow-up of anxiety, depression, and sleep disturbance. He reports several recent stressors. He reports that EEOC complaint was dismissed in March. He injured his foot/knee at work. He reports he is currently out of work under [employer]. He reports that mental health meeting at work was "kind of triggering" after some people were joking about mental health and he perceived that they were being dismissive. He denies feeling "targeted" during meeting. Recent stressor involving loved one. He reports that recent stressors have negatively impacted his mood and anxiety. He reports that he may be responding to some recent stressors by disconnecting or not feeling, since this is how he has responded to several past traumatic events. He reports few memories of childhood and teenage years. He reports low energy and motivation at times.  Denies panic attacks. Sleeping ok. Appetite has been decreased. Denies SI.   Klonopin last filled 03/27/23. Methylphenidate last filled 03/09/23.  Past Medication Trials: Lexapro Celexa Buspar Wellbutrin XL Olanzapine Risperdal Trileptal Lamictal Gabapentin Topamax Naltrexone Klonopin NAC Concerta Methylphenidate CD- helpful Ritalin Adhansia- intrusive thoughts, grinding teeth Methylphenidate Propranolol Trazodone Pramipexole  Flowsheet Row ED from 04/29/2021 in [facility] Department at [facility]  C-SSRS RISK CATEGORY No Risk        Review of Systems:  Review of Systems  Cardiovascular:  Negative for palpitations.  Musculoskeletal:  Negative for gait problem.       Reports recent knee and foot pain in response to injury at  work.  Neurological:  Negative for tremors.  Psychiatric/Behavioral:         Please refer to HPI    Medications: I have reviewed the patient's current medications.  Current Outpatient Medications  Medication Sig Dispense Refill   celecoxib (CELEBREX) 200 MG capsule Take 200 mg by mouth daily.     Cholecalciferol (VITAMIN D) 2000 units CAPS Take by mouth.     clonazePAM (KLONOPIN) 0.5 MG tablet Take 1 tablet (0.5 mg total) by mouth 2 (two) times daily. (Patient taking differently: Take 0.5 mg by mouth at bedtime.) 60 tablet 5   escitalopram (LEXAPRO) 20 MG tablet Take 1 tablet (20 mg total) by mouth daily. 90 tablet 1   ezetimibe (ZETIA) 10 MG tablet Take 10 mg by mouth daily.     methylphenidate (CONCERTA) 36 MG PO CR tablet Take 1 tablet (36 mg total) by mouth daily. 30 tablet 0   [START ON 06/01/2023] methylphenidate (CONCERTA) 36 MG PO CR tablet Take 1 tablet (36 mg total) by mouth daily. 30 tablet 0   methylphenidate (RITALIN) 20 MG tablet Take 1 tablet (20 mg total) by mouth daily as needed. 30 tablet 0   [START ON 06/01/2023] methylphenidate (RITALIN) 20 MG tablet Take 1 tablet (20 mg total) by mouth daily after lunch. 30 tablet 0   acetaminophen (TYLENOL) 500 MG tablet Take 500 mg by mouth every 6 (six) hours as needed (pain). (Patient not taking: Reported on 10/12/2022)     allopurinol (ZYLOPRIM) 300 MG tablet Take 300 mg by mouth daily.     hydrochlorothiazide (HYDRODIURIL) 25 MG tablet Take 1 tablet (25 mg total) by mouth daily. (Patient not taking: Reported on  10/12/2022) 30 tablet 11   ibuprofen (ADVIL,MOTRIN) 200 MG tablet Take 800 mg by mouth every 6 (six) hours as needed for headache or moderate pain. (Patient not taking: Reported on 10/12/2022)     MAGNESIUM PO Take by mouth.     [START ON 05/04/2023] methylphenidate (CONCERTA) 36 MG PO CR tablet Take 1 tablet (36 mg total) by mouth daily. 30 tablet 0   [START ON 05/04/2023] methylphenidate (RITALIN) 20 MG tablet Take 1 tablet  (20 mg total) by mouth daily as needed. 30 tablet 0   Multiple Vitamin (MULTIVITAMIN) tablet Take 1 tablet by mouth daily. (Patient not taking: Reported on 10/12/2022)     potassium chloride (KLOR-CON) 10 MEQ tablet Take 1 tablet (10 mEq total) by mouth daily. (Patient not taking: Reported on 04/04/2023) 30 tablet 11   pramipexole (MIRAPEX) 0.5 MG tablet Take 1.5 tablets (0.75 mg total) by mouth every evening. 135 tablet 1   propranolol (INDERAL) 10 MG tablet TAKE 1 TO 2 TABLETS BY MOUTH TWICE A DAY AS NEEDED FOR ANXIETY (Patient not taking: Reported on 04/04/2023) 120 tablet 1   No current facility-administered medications for this visit.    Medication Side Effects: None  Allergies:  Allergies  Allergen Reactions   Risperidone And Related     intolerance   Trileptal [Oxcarbazepine]     intolerance    Past Medical History:  Diagnosis Date   Gout    History of peritonsillar abscess    RLS (restless legs syndrome)     Past Medical History, Surgical history, Social history, and Family history were reviewed and updated as appropriate.   Please see review of systems for further details on the patient's review from today.   Objective:   Physical Exam:  BP 130/80   Pulse 85   Wt 214 lb (97.1 kg)   BMI 30.71 kg/m   Physical Exam Constitutional:      General: He is not in acute distress. Musculoskeletal:        General: No deformity.  Neurological:     Mental Status: He is alert and oriented to person, place, and time.     Coordination: Coordination normal.  Psychiatric:        Attention and Perception: Attention and perception normal. He does not perceive auditory or visual hallucinations.        Mood and Affect: Affect is not labile, blunt, angry or inappropriate.        Speech: Speech normal.        Behavior: Behavior normal.        Thought Content: Thought content normal. Thought content is not paranoid or delusional. Thought content does not include homicidal or  suicidal ideation. Thought content does not include homicidal or suicidal plan.        Cognition and Memory: Cognition and memory normal.        Judgment: Judgment normal.     Comments: Insight intact Mood is depressed and mildly anxious in response to recent stressors     Lab Review:     Component Value Date/Time   NA 139 04/29/2021 0026   NA 143 06/28/2020 1556   K 4.5 04/29/2021 0026   CL 105 04/29/2021 0026   CO2 29 04/29/2021 0026   GLUCOSE 126 (H) 04/29/2021 0026   BUN 15 04/29/2021 0026   BUN 15 06/28/2020 1556   CREATININE 1.11 04/29/2021 0026   CALCIUM 9.0 04/29/2021 0026   PROT 7.1 04/29/2021 0026   PROT 7.1 06/07/2020 1659  ALBUMIN 3.8 04/29/2021 0026   ALBUMIN 4.5 06/07/2020 1659   AST 33 04/29/2021 0026   ALT 33 04/29/2021 0026   ALKPHOS 45 04/29/2021 0026   BILITOT 0.6 04/29/2021 0026   BILITOT 0.3 06/07/2020 1659   GFRNONAA >60 04/29/2021 0026   GFRAA 93 06/28/2020 1556       Component Value Date/Time   WBC 9.7 04/29/2021 0026   RBC 5.17 04/29/2021 0026   HGB 15.8 04/29/2021 0026   HGB 14.7 06/07/2020 1659   HCT 47.5 04/29/2021 0026   HCT 42.8 06/07/2020 1659   PLT 262 04/29/2021 0026   PLT 265 06/07/2020 1659   MCV 91.9 04/29/2021 0026   MCV 92 06/07/2020 1659   MCH 30.6 04/29/2021 0026   MCHC 33.3 04/29/2021 0026   RDW 14.2 04/29/2021 0026   RDW 13.4 06/07/2020 1659   LYMPHSABS 2.4 04/29/2021 0026   MONOABS 1.3 (H) 04/29/2021 0026   EOSABS 0.4 04/29/2021 0026   BASOSABS 0.1 04/29/2021 0026    No results found for: "POCLITH", "LITHIUM"   No results found for: "PHENYTOIN", "PHENOBARB", "VALPROATE", "CBMZ"   .res Assessment: Plan:    Pt seen for 30 minutes and time spent discussing recent stressors and how some of these events seem to have triggered a similar response to what he has experienced following past traumatic events. Pt reports that he has been gradually processing recent stressors and does not think that a change in medication  is needed at this time. Will continue current medications without changes at this time.  Continue Lexapro 20 mg po qd for anxiety and depression. Continue Klonopin 0.5 mg po QHS and 1 tab as needed for severe anxiety or panic.  Continue Concerta 36 mg in the morning for ADHD.  Continue Ritalin 20 mg daily in the afternoon for ADHD.  He reports that he has stopped Propranolol while taking Celebrex since he noticed some side effects with this combination.  Continue Pramipexole 0.75 po q evening for RLS.  Pt to follow-up in 2 months or sooner if clinically indicated.  Patient advised to contact office with any questions, adverse effects, or acute worsening in signs and symptoms.   Timothy Camacho was seen today for follow-up.  Diagnoses and all orders for this visit:  PTSD (post-traumatic stress disorder)  Attention deficit hyperactivity disorder (ADHD), predominantly inattentive type -     methylphenidate (RITALIN) 20 MG tablet; Take 1 tablet (20 mg total) by mouth daily as needed. -     methylphenidate (CONCERTA) 36 MG PO CR tablet; Take 1 tablet (36 mg total) by mouth daily. -     methylphenidate (CONCERTA) 36 MG PO CR tablet; Take 1 tablet (36 mg total) by mouth daily. -     methylphenidate (RITALIN) 20 MG tablet; Take 1 tablet (20 mg total) by mouth daily after lunch.  RLS (restless legs syndrome) -     pramipexole (MIRAPEX) 0.5 MG tablet; Take 1.5 tablets (0.75 mg total) by mouth every evening.  Depression, unspecified depression type  Panic disorder     Please see After Visit Summary for patient specific instructions.  Future Appointments  Date Time Provider Department Center  06/06/2023  3:30 PM Corie Chiquito, PMHNP CP-CP None    No orders of the defined types were placed in this encounter.   -------------------------------

## 2023-04-19 ENCOUNTER — Ambulatory Visit: Payer: 59 | Admitting: Psychiatry

## 2023-05-03 DIAGNOSIS — Z6830 Body mass index (BMI) 30.0-30.9, adult: Secondary | ICD-10-CM | POA: Diagnosis not present

## 2023-05-03 DIAGNOSIS — G2581 Restless legs syndrome: Secondary | ICD-10-CM | POA: Diagnosis not present

## 2023-05-03 DIAGNOSIS — M109 Gout, unspecified: Secondary | ICD-10-CM | POA: Diagnosis not present

## 2023-05-03 DIAGNOSIS — Z87891 Personal history of nicotine dependence: Secondary | ICD-10-CM | POA: Diagnosis not present

## 2023-05-03 DIAGNOSIS — Z803 Family history of malignant neoplasm of breast: Secondary | ICD-10-CM | POA: Diagnosis not present

## 2023-05-03 DIAGNOSIS — E669 Obesity, unspecified: Secondary | ICD-10-CM | POA: Diagnosis not present

## 2023-05-03 DIAGNOSIS — M1990 Unspecified osteoarthritis, unspecified site: Secondary | ICD-10-CM | POA: Diagnosis not present

## 2023-05-03 DIAGNOSIS — F419 Anxiety disorder, unspecified: Secondary | ICD-10-CM | POA: Diagnosis not present

## 2023-05-03 DIAGNOSIS — Z8249 Family history of ischemic heart disease and other diseases of the circulatory system: Secondary | ICD-10-CM | POA: Diagnosis not present

## 2023-05-03 DIAGNOSIS — Z791 Long term (current) use of non-steroidal anti-inflammatories (NSAID): Secondary | ICD-10-CM | POA: Diagnosis not present

## 2023-05-03 DIAGNOSIS — I10 Essential (primary) hypertension: Secondary | ICD-10-CM | POA: Diagnosis not present

## 2023-05-03 DIAGNOSIS — E785 Hyperlipidemia, unspecified: Secondary | ICD-10-CM | POA: Diagnosis not present

## 2023-05-30 ENCOUNTER — Other Ambulatory Visit: Payer: Self-pay | Admitting: Psychiatry

## 2023-05-30 DIAGNOSIS — G2581 Restless legs syndrome: Secondary | ICD-10-CM

## 2023-05-30 DIAGNOSIS — G4700 Insomnia, unspecified: Secondary | ICD-10-CM

## 2023-05-30 DIAGNOSIS — F410 Panic disorder [episodic paroxysmal anxiety] without agoraphobia: Secondary | ICD-10-CM

## 2023-06-06 ENCOUNTER — Ambulatory Visit (INDEPENDENT_AMBULATORY_CARE_PROVIDER_SITE_OTHER): Payer: No Typology Code available for payment source | Admitting: Psychiatry

## 2023-06-06 ENCOUNTER — Encounter: Payer: Self-pay | Admitting: Psychiatry

## 2023-06-06 VITALS — BP 122/79 | HR 68 | Wt 205.0 lb

## 2023-06-06 DIAGNOSIS — G4700 Insomnia, unspecified: Secondary | ICD-10-CM

## 2023-06-06 DIAGNOSIS — F410 Panic disorder [episodic paroxysmal anxiety] without agoraphobia: Secondary | ICD-10-CM

## 2023-06-06 DIAGNOSIS — G2581 Restless legs syndrome: Secondary | ICD-10-CM

## 2023-06-06 DIAGNOSIS — F4310 Post-traumatic stress disorder, unspecified: Secondary | ICD-10-CM | POA: Diagnosis not present

## 2023-06-06 DIAGNOSIS — F32A Depression, unspecified: Secondary | ICD-10-CM

## 2023-06-06 DIAGNOSIS — F900 Attention-deficit hyperactivity disorder, predominantly inattentive type: Secondary | ICD-10-CM

## 2023-06-06 MED ORDER — METHYLPHENIDATE HCL 20 MG PO TABS
20.0000 mg | ORAL_TABLET | Freq: Every day | ORAL | 0 refills | Status: DC | PRN
Start: 2023-08-05 — End: 2023-08-09

## 2023-06-06 MED ORDER — METHYLPHENIDATE HCL 20 MG PO TABS
20.0000 mg | ORAL_TABLET | Freq: Every day | ORAL | 0 refills | Status: DC | PRN
Start: 2023-07-08 — End: 2023-08-09

## 2023-06-06 MED ORDER — METHYLPHENIDATE HCL ER (OSM) 36 MG PO TBCR: 36.0000 mg | EXTENDED_RELEASE_TABLET | Freq: Every day | ORAL | 0 refills | Status: DC

## 2023-06-06 MED ORDER — ESCITALOPRAM OXALATE 20 MG PO TABS
20.0000 mg | ORAL_TABLET | Freq: Every day | ORAL | 1 refills | Status: DC
Start: 2023-06-06 — End: 2023-09-30

## 2023-06-06 MED ORDER — CLONAZEPAM 0.5 MG PO TABS
0.5000 mg | ORAL_TABLET | Freq: Two times a day (BID) | ORAL | 2 refills | Status: DC
Start: 2023-06-27 — End: 2023-09-02
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
## 2023-06-17 ENCOUNTER — Telehealth: Payer: Self-pay | Admitting: Psychiatry

## 2023-06-17 NOTE — Telephone Encounter (Signed)
Pt lvm that he needs a letter for his job talking about the stressors in his life. He said that his mom's estate is one of his stressors and brings out the negative. Please call him at [phone]

## 2023-06-17 NOTE — Telephone Encounter (Signed)
Please call to schedule an earlier appt with Timothy Camacho to discuss when he needs.

## 2023-06-18 ENCOUNTER — Telehealth: Payer: Self-pay | Admitting: Psychiatry

## 2023-07-24 ENCOUNTER — Telehealth: Payer: Self-pay | Admitting: Psychiatry

## 2023-07-24 NOTE — Telephone Encounter (Signed)
Wife, Timothy Camacho, called today at 4:40 reporting that [name] has been manic and currently exhibiting manic behavior. Not sleeping at night, impulsive spending, she notes he isn't taken meds correctly or at all sometimes, aggressive and going off for hours at a time and she doesn't know where he is. She is concerned. Doesn't think that he will go to the hospital , I see an ROI signed from 2021 for wife. I told her that I would report what is happening to the provider.

## 2023-07-24 NOTE — Telephone Encounter (Signed)
Please see message from wife. He was a no show for 7/2 appt, has FU 8/23.

## 2023-07-25 NOTE — Telephone Encounter (Signed)
Recommend scheduling earlier follow-up apt. Please let them know that [facility] is an option if there are safety concerns and/or he needs to be evaluated immediately.

## 2023-07-25 NOTE — Telephone Encounter (Signed)
Wife reported patient has calmed down. He did not go to work and has been sleeping most of the day. Wife said she feels like patient can wait until his scheduled appt, but will call if needed. Did review emergency resources with her.

## 2023-08-09 ENCOUNTER — Encounter: Payer: Self-pay | Admitting: Psychiatry

## 2023-08-09 ENCOUNTER — Ambulatory Visit (INDEPENDENT_AMBULATORY_CARE_PROVIDER_SITE_OTHER): Payer: No Typology Code available for payment source | Admitting: Psychiatry

## 2023-08-09 VITALS — Wt 200.0 lb

## 2023-08-09 DIAGNOSIS — F4310 Post-traumatic stress disorder, unspecified: Secondary | ICD-10-CM | POA: Diagnosis not present

## 2023-08-09 DIAGNOSIS — F900 Attention-deficit hyperactivity disorder, predominantly inattentive type: Secondary | ICD-10-CM

## 2023-08-09 DIAGNOSIS — F39 Unspecified mood [affective] disorder: Secondary | ICD-10-CM

## 2023-08-09 MED ORDER — METHYLPHENIDATE HCL ER (OSM) 36 MG PO TBCR
36.0000 mg | EXTENDED_RELEASE_TABLET | Freq: Every day | ORAL | 0 refills | Status: DC
Start: 2023-09-11 — End: 2023-09-02

## 2023-08-09 MED ORDER — METHYLPHENIDATE HCL ER (OSM) 36 MG PO TBCR
36.0000 mg | EXTENDED_RELEASE_TABLET | Freq: Every day | ORAL | 0 refills | Status: DC
Start: 2023-08-14 — End: 2023-09-02

## 2023-08-09 NOTE — Progress Notes (Unsigned)
Timothy Camacho [id number] 04-Mar-1961 62 y.o.  Subjective:   Patient ID:  Timothy Camacho is a 62 y.o. (DOB [birth year]) male.  Chief Complaint: No chief complaint on file.   HPI Timothy Camacho presents to the office today for follow-up of depression, anxiety, and insomnia.  He reports multiple stressors. He reports that he has not had time to grieve the loss of his mother. 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  He reports that he is without a job. "I'm staying in my mom's house." He has been working on home improvements at her house.   He reports that he has been sleeping 2-3 hours a night. "I'm not mad and I'm not sad." He reports, "I've been trying to get things done" and reports that his energy and motivation have been ok. He reports that concentration is "alright most of the time." He reports some racing thoughts. He reports decreased appetite. Denies SI.   He reports that he has not been taking Ritalin recently due to cost.  Concerta last filled 07/17/23 Klonopin last filled 06/27/23 Ritalin last filled 05/13/23.  Past Medication Trials: Lexapro Celexa Buspar Wellbutrin XL Olanzapine Risperdal Trileptal Lamictal Gabapentin Topamax Naltrexone Klonopin NAC Concerta Methylphenidate CD- helpful Ritalin Adhansia- intrusive thoughts, grinding teeth Methylphenidate Propranolol Trazodone Pramipexole  Flowsheet Row ED from 04/29/2021 in [facility] Emergency Department at [facility]  C-SSRS RISK CATEGORY No Risk        Review of Systems:  Review of Systems  HENT:  Positive for congestion.   Musculoskeletal:  Negative for gait problem.  Neurological:  Positive for headaches.  Psychiatric/Behavioral:         Please refer to HPI    Medications: {medication reviewed/display:3041432}  Current Outpatient Medications  Medication Sig Dispense Refill   acetaminophen (TYLENOL) 500 MG tablet Take 500 mg by mouth every 6 (six) hours as needed (pain). (Patient  not taking: Reported on 10/12/2022)     allopurinol (ZYLOPRIM) 300 MG tablet Take 300 mg by mouth daily.     celecoxib (CELEBREX) 200 MG capsule Take 200 mg by mouth daily.     Cholecalciferol (VITAMIN D) 2000 units CAPS Take by mouth.     clonazePAM (KLONOPIN) 0.5 MG tablet Take 1 tablet (0.5 mg total) by mouth 2 (two) times daily. 60 tablet 2   escitalopram (LEXAPRO) 20 MG tablet Take 1 tablet (20 mg total) by mouth daily. 90 tablet 1   ezetimibe (ZETIA) 10 MG tablet Take 10 mg by mouth daily.     hydrochlorothiazide (HYDRODIURIL) 25 MG tablet Take 1 tablet (25 mg total) by mouth daily. (Patient not taking: Reported on 10/12/2022) 30 tablet 11   ibuprofen (ADVIL,MOTRIN) 200 MG tablet Take 800 mg by mouth every 6 (six) hours as needed for headache or moderate pain. (Patient not taking: Reported on 10/12/2022)     MAGNESIUM PO Take by mouth.     methylphenidate (CONCERTA) 36 MG PO CR tablet Take 1 tablet (36 mg total) by mouth daily. 30 tablet 0   methylphenidate (CONCERTA) 36 MG PO CR tablet Take 1 tablet (36 mg total) by mouth daily. 30 tablet 0   methylphenidate (CONCERTA) 36 MG PO CR tablet Take 1 tablet (36 mg total) by mouth daily. 30 tablet 0   methylphenidate (RITALIN) 20 MG tablet Take 1 tablet (20 mg total) by mouth daily as needed. 30 tablet 0   methylphenidate (RITALIN) 20 MG tablet Take 1 tablet (20 mg total) by mouth daily after lunch. 30  tablet 0   methylphenidate (RITALIN) 20 MG tablet Take 1 tablet (20 mg total) by mouth daily as needed. 30 tablet 0   methylphenidate (RITALIN) 20 MG tablet Take 1 tablet (20 mg total) by mouth daily as needed. 30 tablet 0   Multiple Vitamin (MULTIVITAMIN) tablet Take 1 tablet by mouth daily. (Patient not taking: Reported on 10/12/2022)     potassium chloride (KLOR-CON) 10 MEQ tablet Take 1 tablet (10 mEq total) by mouth daily. (Patient not taking: Reported on 04/04/2023) 30 tablet 11   pramipexole (MIRAPEX) 0.5 MG tablet Take 1.5 tablets (0.75 mg  total) by mouth every evening. 135 tablet 1   propranolol (INDERAL) 10 MG tablet TAKE 1 TO 2 TABLETS BY MOUTH TWICE A DAY AS NEEDED FOR ANXIETY (Patient not taking: Reported on 04/04/2023) 120 tablet 1   No current facility-administered medications for this visit.    Medication Side Effects: {Medication Side Effects (Optional):21014029}  Allergies:  Allergies  Allergen Reactions   Risperidone And Related     intolerance   Trileptal [Oxcarbazepine]     intolerance    Past Medical History:  Diagnosis Date   Gout    History of peritonsillar abscess    RLS (restless legs syndrome)     Past Medical History, Surgical history, Social history, and Family history were reviewed and updated as appropriate.   Please see review of systems for further details on the patient's review from today.   Objective:   Physical Exam:  There were no vitals taken for this visit.  Physical Exam  Lab Review:     Component Value Date/Time   NA 139 04/29/2021 0026   NA 143 06/28/2020 1556   K 4.5 04/29/2021 0026   CL 105 04/29/2021 0026   CO2 29 04/29/2021 0026   GLUCOSE 126 (H) 04/29/2021 0026   BUN 15 04/29/2021 0026   BUN 15 06/28/2020 1556   CREATININE 1.11 04/29/2021 0026   CALCIUM 9.0 04/29/2021 0026   PROT 7.1 04/29/2021 0026   PROT 7.1 06/07/2020 1659   ALBUMIN 3.8 04/29/2021 0026   ALBUMIN 4.5 06/07/2020 1659   AST 33 04/29/2021 0026   ALT 33 04/29/2021 0026   ALKPHOS 45 04/29/2021 0026   BILITOT 0.6 04/29/2021 0026   BILITOT 0.3 06/07/2020 1659   GFRNONAA >60 04/29/2021 0026   GFRAA 93 06/28/2020 1556       Component Value Date/Time   WBC 9.7 04/29/2021 0026   RBC 5.17 04/29/2021 0026   HGB 15.8 04/29/2021 0026   HGB 14.7 06/07/2020 1659   HCT 47.5 04/29/2021 0026   HCT 42.8 06/07/2020 1659   PLT 262 04/29/2021 0026   PLT 265 06/07/2020 1659   MCV 91.9 04/29/2021 0026   MCV 92 06/07/2020 1659   MCH 30.6 04/29/2021 0026   MCHC 33.3 04/29/2021 0026   RDW 14.2  04/29/2021 0026   RDW 13.4 06/07/2020 1659   LYMPHSABS 2.4 04/29/2021 0026   MONOABS 1.3 (H) 04/29/2021 0026   EOSABS 0.4 04/29/2021 0026   BASOSABS 0.1 04/29/2021 0026    No results found for: "POCLITH", "LITHIUM"   No results found for: "PHENYTOIN", "PHENOBARB", "VALPROATE", "CBMZ"   .res Assessment: Plan:   "I'm not changing anything."  There are no diagnoses linked to this encounter.   Please see After Visit Summary for patient specific instructions.  No future appointments.  No orders of the defined types were placed in this encounter.   -------------------------------

## 2023-08-10 MED ORDER — PROPRANOLOL HCL 10 MG PO TABS
ORAL_TABLET | ORAL | 1 refills | Status: DC
Start: 2023-08-10 — End: 2023-09-30

## 2023-08-27 ENCOUNTER — Telehealth: Payer: Self-pay | Admitting: Psychiatry

## 2023-08-27 NOTE — Telephone Encounter (Signed)
Pt Lvm 9/9 @ 8:21p.  He said he is wondering if there something else for Adult ADHD that he can take instead of Concerta.  No upcoming appt scheduled.

## 2023-08-27 NOTE — Telephone Encounter (Signed)
Please call patient to schedule an earlier appt.  ?

## 2023-08-27 NOTE — Telephone Encounter (Signed)
Patient asking if there is another medication for ADHD besides Concerta. Called patient to see why he was wanting another medication. He said it doesn't feel like it is working. He reported taking Ritalin also, but in your last note you said it was stopped. He did fill Ritalin 8/25, but last filled Concerta 7/31. He said he feels that his other dx are more related to ADHD. He said his daughter is a trauma nurse and told him that Concerta was more for adolescents. I told him we had many adults on it. I did try to get specifics from him as to why he felt he needed a change and he was aggressive in his conversation and I was not able to get more details. I told him I was just trying to get as much info as I could so that you could better decide if his dose needed to be increased or if a medication change needed to be made. I did ask admin to call and schedule an earlier appt.   08/11/2023 06/06/2023 1  Methylphenidate 20 Mg Tablet 30.00 30 Je Car [id number] Nor ([id number]) 0/0  Comm [facility] [place] 07/17/2023 06/06/2023 1  Methylphenidate Er 36 Mg Tab 30.00 30

## 2023-08-27 NOTE — Telephone Encounter (Signed)
Greg lvm stating he feels like he hasn't been treated the same since a phone encounter with his spouse. He stated your response to the conversation that was had was for him to go to the hospital. He also stated you don't spend the usual time as before. You only give him 30 mins, and it use to be longer. All due to the call with his spouse allegedly.  A staff member mistakenly called to schedule him an appointment due to the earlier telephone encounter with Clarisse Gouge. He aggressively advised for [place] to listen to the earlier messages( this one is the one he's referring to) before anymore appointments are scheduled Patient is requesting to speak with you. No future appointments at this time are scheduled.  Contact # [phone]

## 2023-09-02 ENCOUNTER — Encounter: Payer: Self-pay | Admitting: Psychiatry

## 2023-09-02 ENCOUNTER — Ambulatory Visit (INDEPENDENT_AMBULATORY_CARE_PROVIDER_SITE_OTHER): Payer: No Typology Code available for payment source | Admitting: Psychiatry

## 2023-09-02 VITALS — BP 119/63 | HR 57

## 2023-09-02 DIAGNOSIS — G4700 Insomnia, unspecified: Secondary | ICD-10-CM

## 2023-09-02 DIAGNOSIS — G2581 Restless legs syndrome: Secondary | ICD-10-CM

## 2023-09-02 DIAGNOSIS — F410 Panic disorder [episodic paroxysmal anxiety] without agoraphobia: Secondary | ICD-10-CM

## 2023-09-02 DIAGNOSIS — F900 Attention-deficit hyperactivity disorder, predominantly inattentive type: Secondary | ICD-10-CM

## 2023-09-02 MED ORDER — CLONAZEPAM 0.5 MG PO TABS
0.5000 mg | ORAL_TABLET | Freq: Every evening | ORAL | 2 refills | Status: DC
Start: 2023-09-02 — End: 2024-01-22

## 2023-09-02 MED ORDER — AMPHETAMINE-DEXTROAMPHET ER 15 MG PO CP24
15.0000 mg | ORAL_CAPSULE | ORAL | 0 refills | Status: DC
Start: 2023-09-02 — End: 2023-09-30

## 2023-09-02 NOTE — Progress Notes (Signed)
Timothy Camacho [id number] 25-Jul-1961 62 y.o.  Subjective:   Patient ID:  Timothy Camacho is a 62 y.o. (DOB Oct 29, 1961) male.  Chief Complaint:  Chief Complaint  Patient presents with   ADHD    HPI Timothy Camacho presents to the office today for follow-up of ADHD, anxiety, mood and sleep disturbance.   He reports multiple stressors since January this year to include job loss, marital stress, and death of his mother.  3`He reports, "I think I have actually done pretty well" in terms of coping. He reports that he has been isolating at times to process what has happened.   He reports that the Concerta is "almost making me lethargic." He reports feeling "mired down... almost don't have the motivation and energy." He reports that he feels slowed and that this interferes with concentration. He reports hyper-focus starting after his mother's death.    He reports intrusive memories from "tumultuous childhood." He reports his anxiety is triggered when feeling "out of control" and feeling lethargic causes him anxiety. He reports that his mood is "for the most part, pretty passive." He reports frustration is "a little bit heightened." He denies persistent sad mood. He reports that he is trying to eat smaller meals throughout the day. He reports some intentional weight loss. He reports having made some financial decisions that in retrospect may have been somewhat risky in an effort "to try to control" due to things feeling out of control. He reports that he has had decreased sleep and some nights going to bed around 2 am and then getting up around 6:30 am. Waking up hourly. Denies nightmares.   He reports that he had one fleeting suicidal thought. Denies SI.   He has been staying at home again instead of his mother's house.   He reports that he has been driving back and forth to take care of mother's cat. He reports that his sister made arrangements to euthanize the cat.   He reports taking  Propranolol nightly.   Rare ETOH use.   He reports that he has not taken Klonopin for 1-1.5 weeks.   Methylphenidate last filled 08/11/23. Concerta last filled 07/17/23. Klonopin last filled 06/27/23.    Past Medication Trials: Lexapro Celexa Buspar Wellbutrin XL Olanzapine Risperdal Trileptal Lamictal Gabapentin Topamax Naltrexone Klonopin NAC Concerta Methylphenidate CD- helpful Ritalin Adhansia- intrusive thoughts, grinding teeth Methylphenidate Propranolol Trazodone Pramipexole  Flowsheet Row ED from 04/29/2021 in [facility] Emergency Department at [facility]  C-SSRS RISK CATEGORY No Risk        Review of Systems:  Review of Systems  Musculoskeletal:  Negative for gait problem.       Knee pain  Neurological:  Negative for tremors.  Psychiatric/Behavioral:         Please refer to HPI    Medications: I have reviewed the patient's current medications.  Current Outpatient Medications  Medication Sig Dispense Refill   acetaminophen (TYLENOL) 500 MG tablet Take 500 mg by mouth every 6 (six) hours as needed (pain).     allopurinol (ZYLOPRIM) 300 MG tablet Take 300 mg by mouth daily.     amphetamine-dextroamphetamine (ADDERALL XR) 15 MG 24 hr capsule Take 1 capsule by mouth every morning. 30 capsule 0   celecoxib (CELEBREX) 200 MG capsule Take 200 mg by mouth daily.     escitalopram (LEXAPRO) 20 MG tablet Take 1 tablet (20 mg total) by mouth daily. 90 tablet 1   ezetimibe (ZETIA) 10 MG tablet Take 10  mg by mouth daily.     propranolol (INDERAL) 10 MG tablet TAKE 1 TO 2 TABLETS BY MOUTH TWICE A DAY AS NEEDED FOR ANXIETY 120 tablet 1   Cholecalciferol (VITAMIN D) 2000 units CAPS Take by mouth.     clonazePAM (KLONOPIN) 0.5 MG tablet Take 1 tablet (0.5 mg total) by mouth at bedtime. 30 tablet 2   hydrochlorothiazide (HYDRODIURIL) 25 MG tablet Take 1 tablet (25 mg total) by mouth daily. (Patient not taking: Reported on 10/12/2022) 30 tablet 11   ibuprofen  (ADVIL,MOTRIN) 200 MG tablet Take 800 mg by mouth every 6 (six) hours as needed for headache or moderate pain. (Patient not taking: Reported on 10/12/2022)     MAGNESIUM PO Take by mouth.     Multiple Vitamin (MULTIVITAMIN) tablet Take 1 tablet by mouth daily. (Patient not taking: Reported on 10/12/2022)     potassium chloride (KLOR-CON) 10 MEQ tablet Take 1 tablet (10 mEq total) by mouth daily. (Patient not taking: Reported on 04/04/2023) 30 tablet 11   pramipexole (MIRAPEX) 0.5 MG tablet Take 1.5 tablets (0.75 mg total) by mouth every evening. 135 tablet 1   No current facility-administered medications for this visit.    Medication Side Effects: Other: Reports possibly feeling slowed with Concerta  Allergies:  Allergies  Allergen Reactions   Risperidone And Related     intolerance   Trileptal [Oxcarbazepine]     intolerance    Past Medical History:  Diagnosis Date   Gout    History of peritonsillar abscess    RLS (restless legs syndrome)     Past Medical History, Surgical history, Social history, and Family history were reviewed and updated as appropriate.   Please see review of systems for further details on the patient's review from today.   Objective:   Physical Exam:  BP 119/63   Pulse (!) 57   Physical Exam Constitutional:      General: He is not in acute distress. Musculoskeletal:        General: No deformity.  Neurological:     Mental Status: He is alert and oriented to person, place, and time.     Coordination: Coordination normal.  Psychiatric:        Attention and Perception: Attention and perception normal. He does not perceive auditory or visual hallucinations.        Mood and Affect: Affect is not labile, blunt, angry or inappropriate.        Speech: Speech normal.        Behavior: Behavior normal.        Thought Content: Thought content normal. Thought content is not paranoid or delusional. Thought content does not include homicidal or suicidal  ideation. Thought content does not include homicidal or suicidal plan.        Cognition and Memory: Cognition and memory normal.        Judgment: Judgment normal.     Comments: Insight intact Mood is mildly dysphoric     Lab Review:     Component Value Date/Time   NA 139 04/29/2021 0026   NA 143 06/28/2020 1556   K 4.5 04/29/2021 0026   CL 105 04/29/2021 0026   CO2 29 04/29/2021 0026   GLUCOSE 126 (H) 04/29/2021 0026   BUN 15 04/29/2021 0026   BUN 15 06/28/2020 1556   CREATININE 1.11 04/29/2021 0026   CALCIUM 9.0 04/29/2021 0026   PROT 7.1 04/29/2021 0026   PROT 7.1 06/07/2020 1659   ALBUMIN 3.8 04/29/2021 0026  ALBUMIN 4.5 06/07/2020 1659   AST 33 04/29/2021 0026   ALT 33 04/29/2021 0026   ALKPHOS 45 04/29/2021 0026   BILITOT 0.6 04/29/2021 0026   BILITOT 0.3 06/07/2020 1659   GFRNONAA >60 04/29/2021 0026   GFRAA 93 06/28/2020 1556       Component Value Date/Time   WBC 9.7 04/29/2021 0026   RBC 5.17 04/29/2021 0026   HGB 15.8 04/29/2021 0026   HGB 14.7 06/07/2020 1659   HCT 47.5 04/29/2021 0026   HCT 42.8 06/07/2020 1659   PLT 262 04/29/2021 0026   PLT 265 06/07/2020 1659   MCV 91.9 04/29/2021 0026   MCV 92 06/07/2020 1659   MCH 30.6 04/29/2021 0026   MCHC 33.3 04/29/2021 0026   RDW 14.2 04/29/2021 0026   RDW 13.4 06/07/2020 1659   LYMPHSABS 2.4 04/29/2021 0026   MONOABS 1.3 (H) 04/29/2021 0026   EOSABS 0.4 04/29/2021 0026   BASOSABS 0.1 04/29/2021 0026    No results found for: "POCLITH", "LITHIUM"   No results found for: "PHENYTOIN", "PHENOBARB", "VALPROATE", "CBMZ"   .res Assessment: Plan:    Will discontinue Concerta and Ritalin.  Discussed potential benefits, risks, and side effects of Adderall XR. Pt agrees to trial of Adderall XR. Will start Adderall XR 15 mg daily for ADHD.  Continue Lexapro 20 mg daily for anxiety and depression.  Continue Klonopin 0.5 mg at bedtime for anxiety, insomnia, and RLS. Continue Pramipexole 0.75 mg every evening  for RLS.  Continue Propranolol prn anxiety.  Pt to follow-up with this provider in 4 weeks or sooner if clinically indicated.  Patient advised to contact office with any questions, adverse effects, or acute worsening in signs and symptoms.   [name] "[name]" was seen today for adhd.  Diagnoses and all orders for this visit:  Attention deficit hyperactivity disorder (ADHD), predominantly inattentive type -     amphetamine-dextroamphetamine (ADDERALL XR) 15 MG 24 hr capsule; Take 1 capsule by mouth every morning.  Panic disorder -     clonazePAM (KLONOPIN) 0.5 MG tablet; Take 1 tablet (0.5 mg total) by mouth at bedtime.  RLS (restless legs syndrome) -     clonazePAM (KLONOPIN) 0.5 MG tablet; Take 1 tablet (0.5 mg total) by mouth at bedtime.  Insomnia, unspecified type -     clonazePAM (KLONOPIN) 0.5 MG tablet; Take 1 tablet (0.5 mg total) by mouth at bedtime.     Please see After Visit Summary for patient specific instructions.  Future Appointments  Date Time Provider Department Center  09/30/2023  8:30 AM Corie Chiquito, PMHNP CP-CP None    No orders of the defined types were placed in this encounter.   -------------------------------

## 2023-09-27 DIAGNOSIS — Z0000 Encounter for general adult medical examination without abnormal findings: Secondary | ICD-10-CM | POA: Diagnosis not present

## 2023-09-27 DIAGNOSIS — G2581 Restless legs syndrome: Secondary | ICD-10-CM | POA: Diagnosis not present

## 2023-09-27 DIAGNOSIS — E559 Vitamin D deficiency, unspecified: Secondary | ICD-10-CM | POA: Diagnosis not present

## 2023-09-27 DIAGNOSIS — E669 Obesity, unspecified: Secondary | ICD-10-CM | POA: Diagnosis not present

## 2023-09-27 DIAGNOSIS — Z23 Encounter for immunization: Secondary | ICD-10-CM | POA: Diagnosis not present

## 2023-09-27 DIAGNOSIS — R5382 Chronic fatigue, unspecified: Secondary | ICD-10-CM | POA: Diagnosis not present

## 2023-09-27 DIAGNOSIS — K760 Fatty (change of) liver, not elsewhere classified: Secondary | ICD-10-CM | POA: Diagnosis not present

## 2023-09-27 DIAGNOSIS — M109 Gout, unspecified: Secondary | ICD-10-CM | POA: Diagnosis not present

## 2023-09-27 DIAGNOSIS — E785 Hyperlipidemia, unspecified: Secondary | ICD-10-CM | POA: Diagnosis not present

## 2023-09-27 DIAGNOSIS — L570 Actinic keratosis: Secondary | ICD-10-CM | POA: Diagnosis not present

## 2023-09-27 DIAGNOSIS — I700 Atherosclerosis of aorta: Secondary | ICD-10-CM | POA: Diagnosis not present

## 2023-09-27 DIAGNOSIS — Z1389 Encounter for screening for other disorder: Secondary | ICD-10-CM | POA: Diagnosis not present

## 2023-09-27 DIAGNOSIS — R7301 Impaired fasting glucose: Secondary | ICD-10-CM | POA: Diagnosis not present

## 2023-09-27 DIAGNOSIS — M1990 Unspecified osteoarthritis, unspecified site: Secondary | ICD-10-CM | POA: Diagnosis not present

## 2023-09-30 ENCOUNTER — Encounter: Payer: Self-pay | Admitting: Psychiatry

## 2023-09-30 ENCOUNTER — Ambulatory Visit (INDEPENDENT_AMBULATORY_CARE_PROVIDER_SITE_OTHER): Payer: 59 | Admitting: Psychiatry

## 2023-09-30 DIAGNOSIS — F4310 Post-traumatic stress disorder, unspecified: Secondary | ICD-10-CM | POA: Diagnosis not present

## 2023-09-30 DIAGNOSIS — F410 Panic disorder [episodic paroxysmal anxiety] without agoraphobia: Secondary | ICD-10-CM

## 2023-09-30 DIAGNOSIS — G2581 Restless legs syndrome: Secondary | ICD-10-CM

## 2023-09-30 DIAGNOSIS — F32A Depression, unspecified: Secondary | ICD-10-CM

## 2023-09-30 MED ORDER — ESCITALOPRAM OXALATE 20 MG PO TABS
20.0000 mg | ORAL_TABLET | Freq: Every day | ORAL | 1 refills | Status: AC
Start: 2023-09-30 — End: ?

## 2023-09-30 MED ORDER — PRAMIPEXOLE DIHYDROCHLORIDE 0.5 MG PO TABS
0.7500 mg | ORAL_TABLET | Freq: Every evening | ORAL | 1 refills | Status: AC
Start: 2023-09-30 — End: 2023-12-29

## 2023-09-30 MED ORDER — PROPRANOLOL HCL 10 MG PO TABS
ORAL_TABLET | ORAL | 1 refills | Status: AC
Start: 2023-09-30 — End: ?

## 2023-09-30 NOTE — Progress Notes (Signed)
Timothy Camacho [id number] 1960/12/31 62 y.o.  Subjective:   Patient ID:  Timothy Camacho is a 62 y.o. (DOB [birth year]) male.  Chief Complaint:  Chief Complaint  Patient presents with   Anxiety    HPI Timothy Camacho presents to the office today for follow-up of ADHD, anxiety, mood disturbance, and sleep disturbance.   He plans to go to a residential program called "[facility]" for trauma symptoms and is not sure about the timeline for this. Denies substance abuse.   He reports that he took Adderall XR for about 3 weeks "and then I stopped it... didn't feel right." He reports that he has not been taking stimulants recently. He reports that his anxiety is less now after drawing more on his faith. He reports that Propranolol has been most helpful for his anxiety and panic. He reports that he has been experiencing flashbacks and intrusive memories. Occ nightmares. Denies depressed mood- "more anger." He reports that he has had difficulty sleeping due to RLS. Appetite is increased. Energy and motivation are slightly lower. Denies SI   Continuing to deal with mother's estate.   Adderall last filled 09/02/23.  Klonopin last filled 08/30/23 x 2.   Past Medication Trials: Lexapro Celexa Buspar Wellbutrin XL Olanzapine Risperdal Trileptal Lamictal Gabapentin Topamax Naltrexone Klonopin NAC Concerta Methylphenidate CD- helpful Ritalin Adhansia- intrusive thoughts, grinding teeth Methylphenidate Adderall XR- "didn't feel right... didn't feel me."  Propranolol Trazodone Pramipexole  Flowsheet Row ED from 04/29/2021 in [facility] Emergency Department at [facility]  C-SSRS RISK CATEGORY No Risk        Review of Systems:  Review of Systems  Musculoskeletal:  Negative for gait problem.  Neurological:        Worsening RLS  Psychiatric/Behavioral:         Please refer to HPI    Medications: I have reviewed the patient's current medications.  Current  Outpatient Medications  Medication Sig Dispense Refill   clonazePAM (KLONOPIN) 0.5 MG tablet Take 1 tablet (0.5 mg total) by mouth at bedtime. 30 tablet 2   ezetimibe (ZETIA) 10 MG tablet Take 10 mg by mouth daily.     acetaminophen (TYLENOL) 500 MG tablet Take 500 mg by mouth every 6 (six) hours as needed (pain).     allopurinol (ZYLOPRIM) 300 MG tablet Take 300 mg by mouth daily. (Patient not taking: Reported on 09/30/2023)     celecoxib (CELEBREX) 200 MG capsule Take 200 mg by mouth daily. (Patient not taking: Reported on 09/30/2023)     Cholecalciferol (VITAMIN D) 2000 units CAPS Take by mouth.     escitalopram (LEXAPRO) 20 MG tablet Take 1 tablet (20 mg total) by mouth daily. 90 tablet 1   hydrochlorothiazide (HYDRODIURIL) 25 MG tablet Take 1 tablet (25 mg total) by mouth daily. (Patient not taking: Reported on 10/12/2022) 30 tablet 11   ibuprofen (ADVIL,MOTRIN) 200 MG tablet Take 800 mg by mouth every 6 (six) hours as needed for headache or moderate pain. (Patient not taking: Reported on 10/12/2022)     MAGNESIUM PO Take by mouth.     Multiple Vitamin (MULTIVITAMIN) tablet Take 1 tablet by mouth daily. (Patient not taking: Reported on 10/12/2022)     potassium chloride (KLOR-CON) 10 MEQ tablet Take 1 tablet (10 mEq total) by mouth daily. (Patient not taking: Reported on 04/04/2023) 30 tablet 11   pramipexole (MIRAPEX) 0.5 MG tablet Take 1.5 tablets (0.75 mg total) by mouth every evening. 135 tablet 1   propranolol (  INDERAL) 10 MG tablet TAKE 1 TO 2 TABLETS BY MOUTH TWICE A DAY AS NEEDED FOR ANXIETY 120 tablet 1   No current facility-administered medications for this visit.    Medication Side Effects: None  Allergies:  Allergies  Allergen Reactions   Risperidone And Related     intolerance   Trileptal [Oxcarbazepine]     intolerance    Past Medical History:  Diagnosis Date   Gout    History of peritonsillar abscess    RLS (restless legs syndrome)     Past Medical History,  Surgical history, Social history, and Family history were reviewed and updated as appropriate.   Please see review of systems for further details on the patient's review from today.   Objective:   Physical Exam:  Wt 199 lb (90.3 kg)   BMI 28.55 kg/m   Physical Exam Constitutional:      General: He is not in acute distress. Musculoskeletal:        General: No deformity.  Neurological:     Mental Status: He is alert and oriented to person, place, and time.     Coordination: Coordination normal.  Psychiatric:        Attention and Perception: Attention and perception normal. He does not perceive auditory or visual hallucinations.        Mood and Affect: Mood is not depressed. Affect is not labile, blunt, angry or inappropriate.        Speech: Speech normal.        Behavior: Behavior normal.        Thought Content: Thought content normal. Thought content is not paranoid or delusional. Thought content does not include homicidal or suicidal ideation. Thought content does not include homicidal or suicidal plan.        Cognition and Memory: Cognition and memory normal.        Judgment: Judgment normal.     Comments: Insight intact Mood presents as less anxious and irritable compared to recent visits     Lab Review:     Component Value Date/Time   NA 139 04/29/2021 0026   NA 143 06/28/2020 1556   K 4.5 04/29/2021 0026   CL 105 04/29/2021 0026   CO2 29 04/29/2021 0026   GLUCOSE 126 (H) 04/29/2021 0026   BUN 15 04/29/2021 0026   BUN 15 06/28/2020 1556   CREATININE 1.11 04/29/2021 0026   CALCIUM 9.0 04/29/2021 0026   PROT 7.1 04/29/2021 0026   PROT 7.1 06/07/2020 1659   ALBUMIN 3.8 04/29/2021 0026   ALBUMIN 4.5 06/07/2020 1659   AST 33 04/29/2021 0026   ALT 33 04/29/2021 0026   ALKPHOS 45 04/29/2021 0026   BILITOT 0.6 04/29/2021 0026   BILITOT 0.3 06/07/2020 1659   GFRNONAA >60 04/29/2021 0026   GFRAA 93 06/28/2020 1556       Component Value Date/Time   WBC 9.7  04/29/2021 0026   RBC 5.17 04/29/2021 0026   HGB 15.8 04/29/2021 0026   HGB 14.7 06/07/2020 1659   HCT 47.5 04/29/2021 0026   HCT 42.8 06/07/2020 1659   PLT 262 04/29/2021 0026   PLT 265 06/07/2020 1659   MCV 91.9 04/29/2021 0026   MCV 92 06/07/2020 1659   MCH 30.6 04/29/2021 0026   MCHC 33.3 04/29/2021 0026   RDW 14.2 04/29/2021 0026   RDW 13.4 06/07/2020 1659   LYMPHSABS 2.4 04/29/2021 0026   MONOABS 1.3 (H) 04/29/2021 0026   EOSABS 0.4 04/29/2021 0026   BASOSABS 0.1 04/29/2021  0026    No results found for: "POCLITH", "LITHIUM"   No results found for: "PHENYTOIN", "PHENOBARB", "VALPROATE", "CBMZ"   .res Assessment: Plan:    35 minutes spent dedicated to the care of this patient on the date of this encounter to include pre-visit review of records, ordering of medication, post visit documentation, and face-to-face time with the patient discussing plan to start PTSD program and agreed that this would likely be beneficial for him. Discussed response to stopping stimulants and that his anxiety is improved today compared to previous exams, although this may be related to several other factors. Agreed with not re-starting a stimulant at this time.  Continue Lexapro 20 mg daily for anxiety and depression.  Continue Klonopin 0.5 mg at bedtime for RLS and insomnia.  Continue Pramipexole 0.75 mg every evening for RLS.  Continue Propranolol 10 mg 1-2 tabs po BID prn anxiety.  Pt to follow-up with this provider in 4 weeks or sooner if clinically indicated.  Patient advised to contact office with any questions, adverse effects, or acute worsening in signs and symptoms.    Timothy Camacho "[name]" was seen today for anxiety.  Diagnoses and all orders for this visit:  Panic disorder -     escitalopram (LEXAPRO) 20 MG tablet; Take 1 tablet (20 mg total) by mouth daily.  PTSD (post-traumatic stress disorder) -     escitalopram (LEXAPRO) 20 MG tablet; Take 1 tablet (20 mg total) by mouth daily. -      propranolol (INDERAL) 10 MG tablet; TAKE 1 TO 2 TABLETS BY MOUTH TWICE A DAY AS NEEDED FOR ANXIETY  Depression, unspecified depression type -     escitalopram (LEXAPRO) 20 MG tablet; Take 1 tablet (20 mg total) by mouth daily.  RLS (restless legs syndrome) -     pramipexole (MIRAPEX) 0.5 MG tablet; Take 1.5 tablets (0.75 mg total) by mouth every evening.     Please see After Visit Summary for patient specific instructions.  Future Appointments  Date Time Provider Department Center  10/31/2023  8:30 AM Corie Chiquito, PMHNP CP-CP None    No orders of the defined types were placed in this encounter.   -------------------------------

## 2023-10-22 ENCOUNTER — Telehealth: Payer: Self-pay | Admitting: Psychiatry

## 2023-10-22 NOTE — Telephone Encounter (Signed)
Patient called aasking for a letter for his emotional support animal. States that he is trying to get into a program that allows support animals and it requires a letter from provider with diagnosis. Its a dog named Rowdy. Ph: [phone]

## 2023-10-22 NOTE — Telephone Encounter (Signed)
Pt asking for letter for ESA.

## 2023-10-24 NOTE — Telephone Encounter (Signed)
Please print the following on letterhead:    To Whom It May Concern:  [name] "Timothy Camacho" Martenson (DOB: 11-Dec-2061) is seen in our office for treatment of PTSD, Panic Disorder, and depression. He has reported on multiple occasions that his dog, Rowdy, is a source of support and companionship for him. He reports that caring for and walking his dog has helped him stay physically active and improves motivation. He reports that his dog has also helped reduce his anxiety on numerous occasions. For these reasons, it is recommended that he be allowed to have Rowdy accompany him during his treatment program if possible.   Please feel free to contact our office with any questions or if additional information is needed.    Sincerely,     Corie Chiquito, PMH-NP

## 2023-10-30 ENCOUNTER — Encounter: Payer: Self-pay | Admitting: Psychiatry

## 2023-10-31 ENCOUNTER — Ambulatory Visit (INDEPENDENT_AMBULATORY_CARE_PROVIDER_SITE_OTHER): Payer: 59 | Admitting: Psychiatry

## 2023-10-31 ENCOUNTER — Encounter: Payer: Self-pay | Admitting: Psychiatry

## 2023-10-31 VITALS — BP 141/76 | HR 51 | Wt 202.0 lb

## 2023-10-31 DIAGNOSIS — G4700 Insomnia, unspecified: Secondary | ICD-10-CM | POA: Diagnosis not present

## 2023-10-31 DIAGNOSIS — F410 Panic disorder [episodic paroxysmal anxiety] without agoraphobia: Secondary | ICD-10-CM

## 2023-10-31 DIAGNOSIS — F900 Attention-deficit hyperactivity disorder, predominantly inattentive type: Secondary | ICD-10-CM | POA: Diagnosis not present

## 2023-10-31 DIAGNOSIS — G2581 Restless legs syndrome: Secondary | ICD-10-CM

## 2023-10-31 MED ORDER — LISDEXAMFETAMINE DIMESYLATE 40 MG PO CAPS
40.0000 mg | ORAL_CAPSULE | ORAL | 0 refills | Status: DC
Start: 2023-10-31 — End: 2023-12-05

## 2023-10-31 NOTE — Progress Notes (Signed)
Timothy Camacho [id number] May 03, 1961 62 y.o.  Subjective:   Patient ID:  Timothy Camacho is a 62 y.o. (DOB 1961/05/16) male.  Chief Complaint:  Chief Complaint  Patient presents with   ADHD   Follow-up    Anxiety, sleep disturbance, mood disturbance    HPI Timothy Camacho presents to the office today for follow-up of anxiety, mood disturbance, ADHD, and insomnia. He reports that he would like to resume treatment for ADHD. He reports that distractibility and diminished concentration seems to exacerbate PTSD symptoms. He reports frequent intrusive memories and flashbacks. He reports that he has had some nightmares.  He reports that some ADHD meds have seemed to be partially effective. He reports increased anxiety. Reports frustration in response to situational stressors. Denies recent panic attacks. Describes mood as "not happy." Energy and motivation are ok. He reports that sleep amount varies. Appetite is ok. Denies SI.   He reports that his dog is the only source of affection and support. He reports that he is continuing to work on his mother's house.   Occ Delta 9 and THC use. Minimal ETOH use.   Klonopin 0.5 mg filled 09/30/23  Past Medication Trials: Lexapro Celexa Buspar Wellbutrin XL Olanzapine Risperdal Trileptal Lamictal Gabapentin Topamax Naltrexone Klonopin NAC Concerta Methylphenidate CD- helpful Ritalin Adhansia- intrusive thoughts, grinding teeth Methylphenidate Adderall XR- "didn't feel right... didn't feel me."  Propranolol Trazodone Pramipexole   Flowsheet Row ED from 04/29/2021 in [facility] Emergency [facility] at [facility]  C-SSRS RISK CATEGORY No Risk        Review of Systems:  Review of Systems  Musculoskeletal:  Positive for gait problem.       Knee pain  Neurological:        RLS controlled with Pramipexole and Klonopin  Psychiatric/Behavioral:         Please refer to HPI    Medications: I have reviewed the patient's  current medications.  Current Outpatient Medications  Medication Sig Dispense Refill   allopurinol (ZYLOPRIM) 300 MG tablet Take 300 mg by mouth daily.     celecoxib (CELEBREX) 200 MG capsule Take 200 mg by mouth daily.     clonazePAM (KLONOPIN) 0.5 MG tablet Take 1 tablet (0.5 mg total) by mouth at bedtime. 30 tablet 2   escitalopram (LEXAPRO) 20 MG tablet Take 1 tablet (20 mg total) by mouth daily. 90 tablet 1   ezetimibe (ZETIA) 10 MG tablet Take 10 mg by mouth daily.     lisdexamfetamine (VYVANSE) 40 MG capsule Take 1 capsule (40 mg total) by mouth every morning. 30 capsule 0   pramipexole (MIRAPEX) 0.5 MG tablet Take 1.5 tablets (0.75 mg total) by mouth every evening. 135 tablet 1   propranolol (INDERAL) 10 MG tablet TAKE 1 TO 2 TABLETS BY MOUTH TWICE A DAY AS NEEDED FOR ANXIETY 120 tablet 1   acetaminophen (TYLENOL) 500 MG tablet Take 500 mg by mouth every 6 (six) hours as needed (pain).     Cholecalciferol (VITAMIN D) 2000 units CAPS Take by mouth.     hydrochlorothiazide (HYDRODIURIL) 25 MG tablet Take 1 tablet (25 mg total) by mouth daily. (Patient not taking: Reported on 10/12/2022) 30 tablet 11   ibuprofen (ADVIL,MOTRIN) 200 MG tablet Take 800 mg by mouth every 6 (six) hours as needed for headache or moderate pain. (Patient not taking: Reported on 10/12/2022)     MAGNESIUM PO Take by mouth.     Multiple Vitamin (MULTIVITAMIN) tablet Take 1 tablet by  mouth daily. (Patient not taking: Reported on 10/12/2022)     potassium chloride (KLOR-CON) 10 MEQ tablet Take 1 tablet (10 mEq total) by mouth daily. (Patient not taking: Reported on 04/04/2023) 30 tablet 11   No current facility-administered medications for this visit.    Medication Side Effects: None  Allergies:  Allergies  Allergen Reactions   Risperidone And Related     intolerance   Trileptal [Oxcarbazepine]     intolerance    Past Medical History:  Diagnosis Date   Gout    History of peritonsillar abscess    RLS  (restless legs syndrome)     Past Medical History, Surgical history, Social history, and Family history were reviewed and updated as appropriate.   Please see review of systems for further details on the patient's review from today.   Objective:   Physical Exam:  BP (!) 141/76   Pulse (!) 51   Wt 202 lb (91.6 kg)   BMI 28.98 kg/m   Physical Exam Constitutional:      General: He is not in acute distress. Musculoskeletal:        General: No deformity.  Neurological:     Mental Status: He is alert and oriented to person, place, and time.     Coordination: Coordination normal.  Psychiatric:        Attention and Perception: Attention and perception normal. He does not perceive auditory or visual hallucinations.        Mood and Affect: Mood is anxious. Affect is not labile, blunt, angry or inappropriate.        Speech: Speech normal.        Behavior: Behavior normal.        Thought Content: Thought content normal. Thought content is not paranoid or delusional. Thought content does not include homicidal or suicidal ideation. Thought content does not include homicidal or suicidal plan.        Cognition and Memory: Cognition and memory normal.        Judgment: Judgment normal.     Comments: Insight intact Dysphoric mood     Lab Review:     Component Value Date/Time   NA 139 04/29/2021 0026   NA 143 06/28/2020 1556   K 4.5 04/29/2021 0026   CL 105 04/29/2021 0026   CO2 29 04/29/2021 0026   GLUCOSE 126 (H) 04/29/2021 0026   BUN 15 04/29/2021 0026   BUN 15 06/28/2020 1556   CREATININE 1.11 04/29/2021 0026   CALCIUM 9.0 04/29/2021 0026   PROT 7.1 04/29/2021 0026   PROT 7.1 06/07/2020 1659   ALBUMIN 3.8 04/29/2021 0026   ALBUMIN 4.5 06/07/2020 1659   AST 33 04/29/2021 0026   ALT 33 04/29/2021 0026   ALKPHOS 45 04/29/2021 0026   BILITOT 0.6 04/29/2021 0026   BILITOT 0.3 06/07/2020 1659   GFRNONAA >60 04/29/2021 0026   GFRAA 93 06/28/2020 1556       Component Value  Date/Time   WBC 9.7 04/29/2021 0026   RBC 5.17 04/29/2021 0026   HGB 15.8 04/29/2021 0026   HGB 14.7 06/07/2020 1659   HCT 47.5 04/29/2021 0026   HCT 42.8 06/07/2020 1659   PLT 262 04/29/2021 0026   PLT 265 06/07/2020 1659   MCV 91.9 04/29/2021 0026   MCV 92 06/07/2020 1659   MCH 30.6 04/29/2021 0026   MCHC 33.3 04/29/2021 0026   RDW 14.2 04/29/2021 0026   RDW 13.4 06/07/2020 1659   LYMPHSABS 2.4 04/29/2021 0026   MONOABS  1.3 (H) 04/29/2021 0026   EOSABS 0.4 04/29/2021 0026   BASOSABS 0.1 04/29/2021 0026    No results found for: "POCLITH", "LITHIUM"   No results found for: "PHENYTOIN", "PHENOBARB", "VALPROATE", "CBMZ"   .res Assessment: Plan:    Discussed treatment options for ADHD to include Discussed potential benefits, risks, and side effects of Vyvanse. Pt agrees to trial of Vyvanse. Will start Vyvanse 40 mg daily for ADHD.  Agree with plan to consider residential treatment facility for PTSD.  Continue Lexapro 20 mg daily for depression and anxiety.  Continue Klonopin 0.5 mg at bedtime for RLS and insomnia.  Continue Pramipexole 0.75 mg every evening for RLS. Continue Propranolol 10 mg 1-2 tabs BID prn anxiety or RLS.  Pt to follow-up with this provider in 4 weeks or sooner if clinically indicated.  Patient advised to contact office with any questions, adverse effects, or acute worsening in signs and symptoms.   [name] "[name]" was seen today for adhd and follow-up.  Diagnoses and all orders for this visit:  Attention deficit hyperactivity disorder (ADHD), predominantly inattentive type -     lisdexamfetamine (VYVANSE) 40 MG capsule; Take 1 capsule (40 mg total) by mouth every morning.  Panic disorder  RLS (restless legs syndrome)  Insomnia, unspecified type     Please see After Visit Summary for patient specific instructions.  No future appointments.   No orders of the defined types were placed in this encounter.   -------------------------------

## 2023-11-07 DIAGNOSIS — M25562 Pain in left knee: Secondary | ICD-10-CM | POA: Diagnosis not present

## 2023-11-07 DIAGNOSIS — G8929 Other chronic pain: Secondary | ICD-10-CM | POA: Diagnosis not present

## 2023-11-26 DIAGNOSIS — M25562 Pain in left knee: Secondary | ICD-10-CM | POA: Diagnosis not present

## 2023-11-26 DIAGNOSIS — G8929 Other chronic pain: Secondary | ICD-10-CM | POA: Diagnosis not present

## 2023-12-05 ENCOUNTER — Other Ambulatory Visit: Payer: Self-pay

## 2023-12-05 DIAGNOSIS — F900 Attention-deficit hyperactivity disorder, predominantly inattentive type: Secondary | ICD-10-CM

## 2023-12-05 MED ORDER — LISDEXAMFETAMINE DIMESYLATE 40 MG PO CAPS: 40.0000 mg | ORAL_CAPSULE | ORAL | 0 refills | Status: DC

## 2023-12-25 ENCOUNTER — Telehealth: Payer: Self-pay | Admitting: Psychiatry

## 2023-12-25 ENCOUNTER — Other Ambulatory Visit: Payer: Self-pay

## 2023-12-25 DIAGNOSIS — F900 Attention-deficit hyperactivity disorder, predominantly inattentive type: Secondary | ICD-10-CM

## 2023-12-25 MED ORDER — LISDEXAMFETAMINE DIMESYLATE 40 MG PO CAPS: 40.0000 mg | ORAL_CAPSULE | ORAL | 0 refills | Status: AC

## 2023-12-25 NOTE — Telephone Encounter (Signed)
 LF 12/19, due 1/16

## 2023-12-25 NOTE — Telephone Encounter (Signed)
 Patient lvm at 11:00 requesting refill for generic Vyvanse 40mg . Ph: [phone] [facility] [address] Jacky Kindle

## 2023-12-25 NOTE — Telephone Encounter (Signed)
 Pended Vyvanse for fill 1/16 to [facility].

## 2024-01-15 DIAGNOSIS — R6883 Chills (without fever): Secondary | ICD-10-CM | POA: Diagnosis not present

## 2024-01-15 DIAGNOSIS — Z23 Encounter for immunization: Secondary | ICD-10-CM | POA: Diagnosis not present

## 2024-01-15 DIAGNOSIS — S61552A Open bite of left wrist, initial encounter: Secondary | ICD-10-CM | POA: Diagnosis not present

## 2024-01-15 DIAGNOSIS — M791 Myalgia, unspecified site: Secondary | ICD-10-CM | POA: Diagnosis not present

## 2024-01-15 DIAGNOSIS — R519 Headache, unspecified: Secondary | ICD-10-CM | POA: Diagnosis not present

## 2024-01-15 DIAGNOSIS — Z2914 Encounter for prophylactic rabies immune globin: Secondary | ICD-10-CM | POA: Diagnosis not present

## 2024-01-15 DIAGNOSIS — W5501XA Bitten by cat, initial encounter: Secondary | ICD-10-CM | POA: Diagnosis not present

## 2024-01-15 DIAGNOSIS — Z203 Contact with and (suspected) exposure to rabies: Secondary | ICD-10-CM | POA: Diagnosis not present

## 2024-01-18 DIAGNOSIS — Z2914 Encounter for prophylactic rabies immune globin: Secondary | ICD-10-CM | POA: Diagnosis not present

## 2024-01-18 DIAGNOSIS — Z203 Contact with and (suspected) exposure to rabies: Secondary | ICD-10-CM | POA: Diagnosis not present

## 2024-01-21 ENCOUNTER — Telehealth: Payer: Self-pay

## 2024-01-21 NOTE — Telephone Encounter (Signed)
Patient was due for FU in Dec. Please call and see if he is going to follow with Shanda Bumps, and if not he needs to be scheduled with another provider.

## 2024-01-22 ENCOUNTER — Other Ambulatory Visit: Payer: Self-pay

## 2024-01-22 DIAGNOSIS — G2581 Restless legs syndrome: Secondary | ICD-10-CM

## 2024-01-22 DIAGNOSIS — F410 Panic disorder [episodic paroxysmal anxiety] without agoraphobia: Secondary | ICD-10-CM

## 2024-01-22 DIAGNOSIS — G4700 Insomnia, unspecified: Secondary | ICD-10-CM

## 2024-01-22 MED ORDER — CLONAZEPAM 0.5 MG PO TABS: 0.5000 mg | ORAL_TABLET | Freq: Every evening | ORAL | 1 refills | Status: AC

## 2024-01-22 NOTE — Telephone Encounter (Signed)
 Pt called at 11:55a; he is requesting Clonazepam  to  [id number] [address] [facility] - [address] [place], [place] [address] Phone: [phone]  Fax: [phone]   He has no pills left.  He said he would like to see someone here because he doesn't like virtual appts.  I will give his info to [facility] to find out who he can see here.

## 2024-01-22 NOTE — Telephone Encounter (Signed)
 Pended clonazepam  to [facility] [address]

## 2024-01-24 DIAGNOSIS — Z2914 Encounter for prophylactic rabies immune globin: Secondary | ICD-10-CM | POA: Diagnosis not present

## 2024-01-24 DIAGNOSIS — Z203 Contact with and (suspected) exposure to rabies: Secondary | ICD-10-CM | POA: Diagnosis not present

## 2024-01-24 DIAGNOSIS — M25562 Pain in left knee: Secondary | ICD-10-CM | POA: Diagnosis not present

## 2024-01-24 DIAGNOSIS — G8929 Other chronic pain: Secondary | ICD-10-CM | POA: Diagnosis not present

## 2024-02-07 DIAGNOSIS — G8929 Other chronic pain: Secondary | ICD-10-CM | POA: Diagnosis not present

## 2024-02-07 DIAGNOSIS — M25562 Pain in left knee: Secondary | ICD-10-CM | POA: Diagnosis not present

## 2024-02-07 DIAGNOSIS — R6889 Other general symptoms and signs: Secondary | ICD-10-CM | POA: Diagnosis not present

## 2024-02-13 DIAGNOSIS — F32A Depression, unspecified: Secondary | ICD-10-CM | POA: Diagnosis not present

## 2024-02-13 DIAGNOSIS — F4310 Post-traumatic stress disorder, unspecified: Secondary | ICD-10-CM | POA: Diagnosis not present

## 2024-02-13 DIAGNOSIS — F411 Generalized anxiety disorder: Secondary | ICD-10-CM | POA: Diagnosis not present

## 2024-02-13 DIAGNOSIS — G4700 Insomnia, unspecified: Secondary | ICD-10-CM | POA: Diagnosis not present

## 2024-03-25 DIAGNOSIS — R92333 Mammographic heterogeneous density, bilateral breasts: Secondary | ICD-10-CM | POA: Diagnosis not present

## 2024-03-25 DIAGNOSIS — N6321 Unspecified lump in the left breast, upper outer quadrant: Secondary | ICD-10-CM | POA: Diagnosis not present

## 2024-03-25 DIAGNOSIS — Z125 Encounter for screening for malignant neoplasm of prostate: Secondary | ICD-10-CM | POA: Diagnosis not present

## 2024-03-25 DIAGNOSIS — E785 Hyperlipidemia, unspecified: Secondary | ICD-10-CM | POA: Diagnosis not present

## 2024-03-25 DIAGNOSIS — E559 Vitamin D deficiency, unspecified: Secondary | ICD-10-CM | POA: Diagnosis not present

## 2024-03-25 DIAGNOSIS — N644 Mastodynia: Secondary | ICD-10-CM | POA: Diagnosis not present

## 2024-03-25 DIAGNOSIS — R7301 Impaired fasting glucose: Secondary | ICD-10-CM | POA: Diagnosis not present

## 2024-03-25 DIAGNOSIS — Z79899 Other long term (current) drug therapy: Secondary | ICD-10-CM | POA: Diagnosis not present

## 2024-03-25 DIAGNOSIS — M109 Gout, unspecified: Secondary | ICD-10-CM | POA: Diagnosis not present

## 2024-03-25 DIAGNOSIS — R7989 Other specified abnormal findings of blood chemistry: Secondary | ICD-10-CM | POA: Diagnosis not present

## 2024-03-27 DIAGNOSIS — E559 Vitamin D deficiency, unspecified: Secondary | ICD-10-CM | POA: Diagnosis not present

## 2024-03-27 DIAGNOSIS — E785 Hyperlipidemia, unspecified: Secondary | ICD-10-CM | POA: Diagnosis not present

## 2024-03-27 DIAGNOSIS — K760 Fatty (change of) liver, not elsewhere classified: Secondary | ICD-10-CM | POA: Diagnosis not present

## 2024-03-27 DIAGNOSIS — H6123 Impacted cerumen, bilateral: Secondary | ICD-10-CM | POA: Diagnosis not present

## 2024-03-27 DIAGNOSIS — G2581 Restless legs syndrome: Secondary | ICD-10-CM | POA: Diagnosis not present

## 2024-03-27 DIAGNOSIS — R82998 Other abnormal findings in urine: Secondary | ICD-10-CM | POA: Diagnosis not present

## 2024-03-27 DIAGNOSIS — F900 Attention-deficit hyperactivity disorder, predominantly inattentive type: Secondary | ICD-10-CM | POA: Diagnosis not present

## 2024-03-27 DIAGNOSIS — N529 Male erectile dysfunction, unspecified: Secondary | ICD-10-CM | POA: Diagnosis not present

## 2024-03-27 DIAGNOSIS — I700 Atherosclerosis of aorta: Secondary | ICD-10-CM | POA: Diagnosis not present

## 2024-03-27 DIAGNOSIS — Z0000 Encounter for general adult medical examination without abnormal findings: Secondary | ICD-10-CM | POA: Diagnosis not present

## 2024-03-27 DIAGNOSIS — E669 Obesity, unspecified: Secondary | ICD-10-CM | POA: Diagnosis not present

## 2024-03-27 DIAGNOSIS — M109 Gout, unspecified: Secondary | ICD-10-CM | POA: Diagnosis not present

## 2024-03-27 DIAGNOSIS — M1990 Unspecified osteoarthritis, unspecified site: Secondary | ICD-10-CM | POA: Diagnosis not present

## 2024-04-16 DIAGNOSIS — F411 Generalized anxiety disorder: Secondary | ICD-10-CM | POA: Diagnosis not present

## 2024-04-16 DIAGNOSIS — F4310 Post-traumatic stress disorder, unspecified: Secondary | ICD-10-CM | POA: Diagnosis not present

## 2024-04-16 DIAGNOSIS — G4700 Insomnia, unspecified: Secondary | ICD-10-CM | POA: Diagnosis not present

## 2024-04-16 DIAGNOSIS — F32A Depression, unspecified: Secondary | ICD-10-CM | POA: Diagnosis not present

## 2024-07-16 DIAGNOSIS — F32A Depression, unspecified: Secondary | ICD-10-CM | POA: Diagnosis not present

## 2024-07-16 DIAGNOSIS — G4700 Insomnia, unspecified: Secondary | ICD-10-CM | POA: Diagnosis not present

## 2024-07-16 DIAGNOSIS — F411 Generalized anxiety disorder: Secondary | ICD-10-CM | POA: Diagnosis not present

## 2024-07-16 DIAGNOSIS — F4310 Post-traumatic stress disorder, unspecified: Secondary | ICD-10-CM | POA: Diagnosis not present
